# Patient Record
Sex: Female | Born: 1960 | Race: White | Hispanic: No | Smoking: Never smoker
Health system: Southern US, Community
[De-identification: ages and names within clinical notes are randomized; demographics above are authoritative.]

## PROBLEM LIST (undated history)

## (undated) DIAGNOSIS — D229 Melanocytic nevi, unspecified: Secondary | ICD-10-CM

## (undated) DIAGNOSIS — F0280 Dementia in other diseases classified elsewhere without behavioral disturbance: Secondary | ICD-10-CM

## (undated) DIAGNOSIS — F0390 Unspecified dementia without behavioral disturbance: Secondary | ICD-10-CM

## (undated) DIAGNOSIS — A599 Trichomoniasis, unspecified: Secondary | ICD-10-CM

## (undated) DIAGNOSIS — R51 Headache: Secondary | ICD-10-CM

## (undated) DIAGNOSIS — D332 Benign neoplasm of brain, unspecified: Secondary | ICD-10-CM

## (undated) DIAGNOSIS — F32A Depression, unspecified: Secondary | ICD-10-CM

## (undated) DIAGNOSIS — F419 Anxiety disorder, unspecified: Secondary | ICD-10-CM

## (undated) DIAGNOSIS — M810 Age-related osteoporosis without current pathological fracture: Secondary | ICD-10-CM

## (undated) DIAGNOSIS — R569 Unspecified convulsions: Secondary | ICD-10-CM

## (undated) DIAGNOSIS — I10 Essential (primary) hypertension: Secondary | ICD-10-CM

## (undated) DIAGNOSIS — G8929 Other chronic pain: Secondary | ICD-10-CM

## (undated) DIAGNOSIS — E7800 Pure hypercholesterolemia, unspecified: Secondary | ICD-10-CM

## (undated) HISTORY — DX: Dementia in other diseases classified elsewhere, unspecified severity, without behavioral disturbance, psychotic disturbance, mood disturbance, and anxiety: F02.80

## (undated) HISTORY — DX: Anxiety disorder, unspecified: F41.9

## (undated) HISTORY — DX: Other chronic pain: G89.29

## (undated) HISTORY — DX: Melanocytic nevi, unspecified: D22.9

## (undated) HISTORY — DX: Depression, unspecified: F32.A

## (undated) HISTORY — DX: Unspecified dementia, unspecified severity, without behavioral disturbance, psychotic disturbance, mood disturbance, and anxiety: F03.90

## (undated) HISTORY — PX: TUBAL LIGATION: SHX77

## (undated) HISTORY — DX: Pure hypercholesterolemia, unspecified: E78.00

## (undated) HISTORY — DX: Trichomoniasis, unspecified: A59.9

## (undated) HISTORY — DX: Headache: R51

## (undated) HISTORY — DX: Age-related osteoporosis without current pathological fracture: M81.0

---

## 2000-06-19 ENCOUNTER — Emergency Department (HOSPITAL_COMMUNITY): Admission: EM | Admit: 2000-06-19 | Discharge: 2000-06-19 | Payer: Self-pay | Admitting: Emergency Medicine

## 2000-07-08 ENCOUNTER — Emergency Department (HOSPITAL_COMMUNITY): Admission: EM | Admit: 2000-07-08 | Discharge: 2000-07-08 | Payer: Self-pay | Admitting: Emergency Medicine

## 2000-07-17 ENCOUNTER — Emergency Department (HOSPITAL_COMMUNITY): Admission: EM | Admit: 2000-07-17 | Discharge: 2000-07-17 | Payer: Self-pay | Admitting: Emergency Medicine

## 2000-11-14 ENCOUNTER — Emergency Department (HOSPITAL_COMMUNITY): Admission: EM | Admit: 2000-11-14 | Discharge: 2000-11-14 | Payer: Self-pay | Admitting: *Deleted

## 2001-01-06 ENCOUNTER — Emergency Department (HOSPITAL_COMMUNITY): Admission: EM | Admit: 2001-01-06 | Discharge: 2001-01-06 | Payer: Self-pay | Admitting: Emergency Medicine

## 2001-04-26 ENCOUNTER — Emergency Department (HOSPITAL_COMMUNITY): Admission: EM | Admit: 2001-04-26 | Discharge: 2001-04-26 | Payer: Self-pay | Admitting: *Deleted

## 2001-05-19 ENCOUNTER — Other Ambulatory Visit: Admission: RE | Admit: 2001-05-19 | Discharge: 2001-05-19 | Payer: Self-pay | Admitting: Internal Medicine

## 2001-11-21 ENCOUNTER — Emergency Department (HOSPITAL_COMMUNITY): Admission: EM | Admit: 2001-11-21 | Discharge: 2001-11-21 | Payer: Self-pay | Admitting: *Deleted

## 2002-05-03 ENCOUNTER — Emergency Department (HOSPITAL_COMMUNITY): Admission: EM | Admit: 2002-05-03 | Discharge: 2002-05-03 | Payer: Self-pay | Admitting: Emergency Medicine

## 2002-06-06 ENCOUNTER — Encounter: Payer: Self-pay | Admitting: *Deleted

## 2002-06-06 ENCOUNTER — Emergency Department (HOSPITAL_COMMUNITY): Admission: EM | Admit: 2002-06-06 | Discharge: 2002-06-06 | Payer: Self-pay | Admitting: *Deleted

## 2002-07-09 ENCOUNTER — Emergency Department (HOSPITAL_COMMUNITY): Admission: EM | Admit: 2002-07-09 | Discharge: 2002-07-09 | Payer: Self-pay | Admitting: Emergency Medicine

## 2002-08-28 ENCOUNTER — Encounter: Payer: Self-pay | Admitting: Emergency Medicine

## 2002-08-28 ENCOUNTER — Emergency Department (HOSPITAL_COMMUNITY): Admission: EM | Admit: 2002-08-28 | Discharge: 2002-08-28 | Payer: Self-pay | Admitting: Emergency Medicine

## 2003-03-30 ENCOUNTER — Emergency Department (HOSPITAL_COMMUNITY): Admission: EM | Admit: 2003-03-30 | Discharge: 2003-03-30 | Payer: Self-pay | Admitting: Emergency Medicine

## 2003-05-22 ENCOUNTER — Emergency Department (HOSPITAL_COMMUNITY): Admission: EM | Admit: 2003-05-22 | Discharge: 2003-05-22 | Payer: Self-pay | Admitting: Emergency Medicine

## 2003-12-11 ENCOUNTER — Ambulatory Visit (HOSPITAL_COMMUNITY): Admission: RE | Admit: 2003-12-11 | Discharge: 2003-12-11 | Payer: Self-pay | Admitting: Internal Medicine

## 2004-03-01 ENCOUNTER — Emergency Department (HOSPITAL_COMMUNITY): Admission: EM | Admit: 2004-03-01 | Discharge: 2004-03-01 | Payer: Self-pay | Admitting: Emergency Medicine

## 2004-03-08 ENCOUNTER — Emergency Department (HOSPITAL_COMMUNITY): Admission: EM | Admit: 2004-03-08 | Discharge: 2004-03-08 | Payer: Self-pay | Admitting: Emergency Medicine

## 2004-04-25 ENCOUNTER — Emergency Department (HOSPITAL_COMMUNITY): Admission: EM | Admit: 2004-04-25 | Discharge: 2004-04-25 | Payer: Self-pay | Admitting: Emergency Medicine

## 2005-01-18 ENCOUNTER — Emergency Department (HOSPITAL_COMMUNITY): Admission: EM | Admit: 2005-01-18 | Discharge: 2005-01-18 | Payer: Self-pay | Admitting: Emergency Medicine

## 2005-07-03 ENCOUNTER — Ambulatory Visit (HOSPITAL_COMMUNITY): Admission: RE | Admit: 2005-07-03 | Discharge: 2005-07-03 | Payer: Self-pay | Admitting: Urology

## 2006-02-16 ENCOUNTER — Emergency Department (HOSPITAL_COMMUNITY): Admission: EM | Admit: 2006-02-16 | Discharge: 2006-02-16 | Payer: Self-pay | Admitting: Emergency Medicine

## 2007-02-27 ENCOUNTER — Emergency Department (HOSPITAL_COMMUNITY): Admission: EM | Admit: 2007-02-27 | Discharge: 2007-02-27 | Payer: Self-pay | Admitting: Emergency Medicine

## 2008-12-27 ENCOUNTER — Emergency Department (HOSPITAL_COMMUNITY): Admission: EM | Admit: 2008-12-27 | Discharge: 2008-12-28 | Payer: Self-pay | Admitting: Emergency Medicine

## 2010-03-12 ENCOUNTER — Ambulatory Visit (HOSPITAL_COMMUNITY)
Admission: RE | Admit: 2010-03-12 | Discharge: 2010-03-12 | Payer: Self-pay | Source: Home / Self Care | Attending: Internal Medicine | Admitting: Internal Medicine

## 2010-03-30 ENCOUNTER — Encounter: Payer: Self-pay | Admitting: Internal Medicine

## 2010-03-31 ENCOUNTER — Encounter: Payer: Self-pay | Admitting: Family Medicine

## 2010-04-01 ENCOUNTER — Encounter: Payer: Self-pay | Admitting: Internal Medicine

## 2010-04-10 ENCOUNTER — Encounter: Payer: Self-pay | Admitting: Internal Medicine

## 2010-06-13 LAB — WET PREP, GENITAL
Clue Cells Wet Prep HPF POC: NONE SEEN
Trich, Wet Prep: NONE SEEN
WBC, Wet Prep HPF POC: NONE SEEN
Yeast Wet Prep HPF POC: NONE SEEN

## 2010-06-13 LAB — BASIC METABOLIC PANEL
BUN: 7 mg/dL (ref 6–23)
CO2: 28 mEq/L (ref 19–32)
Calcium: 9.8 mg/dL (ref 8.4–10.5)
Chloride: 108 mEq/L (ref 96–112)
Creatinine, Ser: 0.66 mg/dL (ref 0.4–1.2)
GFR calc Af Amer: >60 mL/min (ref >60)
GFR calc non Af Amer: >60 mL/min (ref >60)
Glucose, Bld: 103 mg/dL — ABNORMAL HIGH (ref 70–99)
Potassium: 3.1 mEq/L — ABNORMAL LOW (ref 3.5–5.1)
Sodium: 138 mEq/L (ref 135–145)

## 2010-06-13 LAB — DIFFERENTIAL
Basophils Absolute: 0 10*3/uL (ref 0.0–0.1)
Basophils Relative: 0 % (ref 0–1)
Eosinophils Absolute: 0 10*3/uL (ref 0.0–0.7)
Eosinophils Relative: 0 % (ref 0–5)
Lymphocytes Relative: 21 % (ref 12–46)
Lymphs Abs: 1.6 10*3/uL (ref 0.7–4.0)
Monocytes Absolute: 0.5 10*3/uL (ref 0.1–1.0)
Monocytes Relative: 6 % (ref 3–12)
Neutro Abs: 5.5 10*3/uL (ref 1.7–7.7)
Neutrophils Relative %: 72 % (ref 43–77)

## 2010-06-13 LAB — CBC
HCT: 35.9 % — ABNORMAL LOW (ref 36.0–46.0)
Hemoglobin: 12.3 g/dL (ref 12.0–15.0)
MCHC: 34.4 g/dL (ref 30.0–36.0)
MCV: 93.9 fL (ref 78.0–100.0)
Platelets: 205 10*3/uL (ref 150–400)
RBC: 3.83 MIL/uL — ABNORMAL LOW (ref 3.87–5.11)
RDW: 12.9 % (ref 11.5–15.5)
WBC: 7.7 10*3/uL (ref 4.0–10.5)

## 2010-06-13 LAB — URINALYSIS, ROUTINE W REFLEX MICROSCOPIC
Bilirubin Urine: NEGATIVE
Glucose, UA: NEGATIVE mg/dL
Leukocytes, UA: NEGATIVE
Nitrite: POSITIVE — AB
Protein, ur: 100 mg/dL — AB
Specific Gravity, Urine: 1.02 (ref 1.005–1.030)
Urobilinogen, UA: 1 mg/dL (ref 0.0–1.0)
pH: 6.5 (ref 5.0–8.0)

## 2010-06-13 LAB — PREGNANCY, URINE: Preg Test, Ur: NEGATIVE

## 2010-06-13 LAB — URINE MICROSCOPIC-ADD ON

## 2010-06-13 LAB — GC/CHLAMYDIA PROBE AMP, GENITAL
Chlamydia, DNA Probe: NEGATIVE
GC Probe Amp, Genital: NEGATIVE

## 2010-09-16 ENCOUNTER — Other Ambulatory Visit (HOSPITAL_COMMUNITY): Payer: Self-pay | Admitting: Internal Medicine

## 2010-09-16 DIAGNOSIS — R51 Headache: Secondary | ICD-10-CM

## 2010-09-18 ENCOUNTER — Ambulatory Visit (HOSPITAL_COMMUNITY)
Admission: RE | Admit: 2010-09-18 | Discharge: 2010-09-18 | Disposition: A | Discharge status: Home / Self Care | Payer: Medicare Other | Source: Ambulatory Visit | Attending: Internal Medicine | Admitting: Internal Medicine

## 2010-09-18 DIAGNOSIS — G939 Disorder of brain, unspecified: Secondary | ICD-10-CM | POA: Insufficient documentation

## 2010-09-18 DIAGNOSIS — R51 Headache: Secondary | ICD-10-CM | POA: Insufficient documentation

## 2010-11-19 ENCOUNTER — Ambulatory Visit (HOSPITAL_COMMUNITY)
Admission: RE | Admit: 2010-11-19 | Discharge: 2010-11-19 | Disposition: A | Discharge status: Home / Self Care | Payer: Medicare Other | Source: Ambulatory Visit | Attending: Family Medicine | Admitting: Family Medicine

## 2010-11-19 ENCOUNTER — Other Ambulatory Visit (HOSPITAL_COMMUNITY): Payer: Self-pay | Admitting: Family Medicine

## 2010-11-19 ENCOUNTER — Encounter (HOSPITAL_COMMUNITY): Payer: Self-pay

## 2010-11-19 DIAGNOSIS — R109 Unspecified abdominal pain: Secondary | ICD-10-CM

## 2010-11-19 DIAGNOSIS — J069 Acute upper respiratory infection, unspecified: Secondary | ICD-10-CM

## 2010-11-19 DIAGNOSIS — K5909 Other constipation: Secondary | ICD-10-CM | POA: Insufficient documentation

## 2010-11-19 HISTORY — DX: Essential (primary) hypertension: I10

## 2010-12-13 LAB — URINALYSIS, ROUTINE W REFLEX MICROSCOPIC
Bilirubin Urine: NEGATIVE
Glucose, UA: NEGATIVE
Ketones, ur: NEGATIVE
Nitrite: NEGATIVE
pH: >9 — ABNORMAL HIGH

## 2010-12-13 LAB — BASIC METABOLIC PANEL
BUN: 11
CO2: 25
Calcium: 9.7
Chloride: 103
Creatinine, Ser: 0.67
GFR calc Af Amer: >60
GFR calc non Af Amer: >60
Glucose, Bld: 126 — ABNORMAL HIGH
Potassium: 3.4 — ABNORMAL LOW
Sodium: 136

## 2010-12-13 LAB — DIFFERENTIAL
Basophils Absolute: 0
Eosinophils Absolute: 0 — ABNORMAL LOW
Eosinophils Relative: 0
Lymphocytes Relative: 5 — ABNORMAL LOW
Monocytes Absolute: 0.4

## 2010-12-13 LAB — CBC
HCT: 29.5 — ABNORMAL LOW
Hemoglobin: 9.5 — ABNORMAL LOW
MCV: 73.7 — ABNORMAL LOW
RDW: 16.2 — ABNORMAL HIGH

## 2010-12-13 LAB — URINE MICROSCOPIC-ADD ON

## 2011-03-19 DIAGNOSIS — E785 Hyperlipidemia, unspecified: Secondary | ICD-10-CM | POA: Diagnosis not present

## 2011-03-19 DIAGNOSIS — M6740 Ganglion, unspecified site: Secondary | ICD-10-CM | POA: Diagnosis not present

## 2011-03-19 DIAGNOSIS — D510 Vitamin B12 deficiency anemia due to intrinsic factor deficiency: Secondary | ICD-10-CM | POA: Diagnosis not present

## 2011-03-19 DIAGNOSIS — Z6825 Body mass index (BMI) 25.0-25.9, adult: Secondary | ICD-10-CM | POA: Diagnosis not present

## 2011-03-19 DIAGNOSIS — I10 Essential (primary) hypertension: Secondary | ICD-10-CM | POA: Diagnosis not present

## 2011-05-05 ENCOUNTER — Other Ambulatory Visit (HOSPITAL_COMMUNITY): Payer: Self-pay | Admitting: Internal Medicine

## 2011-05-05 DIAGNOSIS — K5900 Constipation, unspecified: Secondary | ICD-10-CM | POA: Diagnosis not present

## 2011-05-05 DIAGNOSIS — Z139 Encounter for screening, unspecified: Secondary | ICD-10-CM

## 2011-05-05 DIAGNOSIS — D518 Other vitamin B12 deficiency anemias: Secondary | ICD-10-CM | POA: Diagnosis not present

## 2011-05-05 DIAGNOSIS — E785 Hyperlipidemia, unspecified: Secondary | ICD-10-CM | POA: Diagnosis not present

## 2011-05-05 DIAGNOSIS — IMO0002 Reserved for concepts with insufficient information to code with codable children: Secondary | ICD-10-CM | POA: Diagnosis not present

## 2011-05-05 DIAGNOSIS — I10 Essential (primary) hypertension: Secondary | ICD-10-CM | POA: Diagnosis not present

## 2011-05-05 DIAGNOSIS — G43909 Migraine, unspecified, not intractable, without status migrainosus: Secondary | ICD-10-CM | POA: Diagnosis not present

## 2011-05-05 DIAGNOSIS — N390 Urinary tract infection, site not specified: Secondary | ICD-10-CM | POA: Diagnosis not present

## 2011-05-08 ENCOUNTER — Ambulatory Visit (HOSPITAL_COMMUNITY): Admission: RE | Admit: 2011-05-08 | Payer: Medicare Other | Source: Ambulatory Visit

## 2011-05-12 DIAGNOSIS — R51 Headache: Secondary | ICD-10-CM | POA: Diagnosis not present

## 2011-05-12 DIAGNOSIS — R42 Dizziness and giddiness: Secondary | ICD-10-CM | POA: Diagnosis not present

## 2011-05-19 ENCOUNTER — Ambulatory Visit (HOSPITAL_COMMUNITY): Payer: Medicare Other

## 2011-05-27 ENCOUNTER — Ambulatory Visit (HOSPITAL_COMMUNITY)
Admission: RE | Admit: 2011-05-27 | Discharge: 2011-05-27 | Disposition: A | Discharge status: Home / Self Care | Payer: Medicare Other | Source: Ambulatory Visit | Attending: Internal Medicine | Admitting: Internal Medicine

## 2011-05-27 DIAGNOSIS — Z139 Encounter for screening, unspecified: Secondary | ICD-10-CM

## 2011-05-27 DIAGNOSIS — Z1231 Encounter for screening mammogram for malignant neoplasm of breast: Secondary | ICD-10-CM | POA: Insufficient documentation

## 2011-05-29 ENCOUNTER — Ambulatory Visit (HOSPITAL_COMMUNITY)
Admission: RE | Admit: 2011-05-29 | Discharge: 2011-05-29 | Disposition: A | Discharge status: Home / Self Care | Payer: Medicare Other | Source: Ambulatory Visit | Attending: Internal Medicine | Admitting: Internal Medicine

## 2011-05-29 ENCOUNTER — Other Ambulatory Visit (HOSPITAL_COMMUNITY): Payer: Self-pay | Admitting: Internal Medicine

## 2011-05-29 DIAGNOSIS — G43909 Migraine, unspecified, not intractable, without status migrainosus: Secondary | ICD-10-CM | POA: Diagnosis not present

## 2011-05-29 DIAGNOSIS — Z7901 Long term (current) use of anticoagulants: Secondary | ICD-10-CM | POA: Diagnosis not present

## 2011-05-29 DIAGNOSIS — R079 Chest pain, unspecified: Secondary | ICD-10-CM

## 2011-05-29 DIAGNOSIS — Z79899 Other long term (current) drug therapy: Secondary | ICD-10-CM | POA: Diagnosis not present

## 2011-05-29 DIAGNOSIS — S2239XA Fracture of one rib, unspecified side, initial encounter for closed fracture: Secondary | ICD-10-CM | POA: Diagnosis not present

## 2011-05-29 DIAGNOSIS — D510 Vitamin B12 deficiency anemia due to intrinsic factor deficiency: Secondary | ICD-10-CM | POA: Diagnosis not present

## 2011-05-29 DIAGNOSIS — IMO0002 Reserved for concepts with insufficient information to code with codable children: Secondary | ICD-10-CM | POA: Diagnosis not present

## 2011-05-29 DIAGNOSIS — R0789 Other chest pain: Secondary | ICD-10-CM | POA: Diagnosis not present

## 2011-05-29 DIAGNOSIS — G8929 Other chronic pain: Secondary | ICD-10-CM | POA: Diagnosis not present

## 2011-06-13 ENCOUNTER — Other Ambulatory Visit (HOSPITAL_COMMUNITY): Payer: Self-pay | Admitting: Internal Medicine

## 2011-06-13 DIAGNOSIS — Z0000 Encounter for general adult medical examination without abnormal findings: Secondary | ICD-10-CM | POA: Diagnosis not present

## 2011-06-13 DIAGNOSIS — E785 Hyperlipidemia, unspecified: Secondary | ICD-10-CM | POA: Diagnosis not present

## 2011-06-13 DIAGNOSIS — D510 Vitamin B12 deficiency anemia due to intrinsic factor deficiency: Secondary | ICD-10-CM | POA: Diagnosis not present

## 2011-06-13 DIAGNOSIS — I10 Essential (primary) hypertension: Secondary | ICD-10-CM | POA: Diagnosis not present

## 2011-06-13 DIAGNOSIS — G43909 Migraine, unspecified, not intractable, without status migrainosus: Secondary | ICD-10-CM | POA: Diagnosis not present

## 2011-06-13 DIAGNOSIS — R0609 Other forms of dyspnea: Secondary | ICD-10-CM | POA: Diagnosis not present

## 2011-06-27 ENCOUNTER — Other Ambulatory Visit (HOSPITAL_COMMUNITY): Payer: Self-pay | Admitting: Internal Medicine

## 2011-06-27 DIAGNOSIS — G43909 Migraine, unspecified, not intractable, without status migrainosus: Secondary | ICD-10-CM

## 2011-07-01 ENCOUNTER — Ambulatory Visit (HOSPITAL_COMMUNITY): Payer: Medicare Other

## 2011-07-02 ENCOUNTER — Ambulatory Visit (HOSPITAL_COMMUNITY): Payer: Medicare Other

## 2011-07-04 ENCOUNTER — Ambulatory Visit (HOSPITAL_COMMUNITY)
Admission: RE | Admit: 2011-07-04 | Discharge: 2011-07-04 | Disposition: A | Discharge status: Home / Self Care | Payer: Medicare Other | Source: Ambulatory Visit | Attending: Internal Medicine | Admitting: Internal Medicine

## 2011-07-04 DIAGNOSIS — G43909 Migraine, unspecified, not intractable, without status migrainosus: Secondary | ICD-10-CM

## 2011-07-04 DIAGNOSIS — R51 Headache: Secondary | ICD-10-CM | POA: Diagnosis not present

## 2011-07-04 DIAGNOSIS — D320 Benign neoplasm of cerebral meninges: Secondary | ICD-10-CM | POA: Insufficient documentation

## 2011-07-17 DIAGNOSIS — D235 Other benign neoplasm of skin of trunk: Secondary | ICD-10-CM | POA: Diagnosis not present

## 2011-07-17 DIAGNOSIS — B079 Viral wart, unspecified: Secondary | ICD-10-CM | POA: Diagnosis not present

## 2011-07-17 DIAGNOSIS — L821 Other seborrheic keratosis: Secondary | ICD-10-CM | POA: Diagnosis not present

## 2011-07-24 DIAGNOSIS — B079 Viral wart, unspecified: Secondary | ICD-10-CM | POA: Diagnosis not present

## 2011-08-14 DIAGNOSIS — G43919 Migraine, unspecified, intractable, without status migrainosus: Secondary | ICD-10-CM | POA: Diagnosis not present

## 2011-08-14 DIAGNOSIS — R51 Headache: Secondary | ICD-10-CM | POA: Diagnosis not present

## 2011-09-05 DIAGNOSIS — N390 Urinary tract infection, site not specified: Secondary | ICD-10-CM | POA: Diagnosis not present

## 2011-09-05 DIAGNOSIS — D518 Other vitamin B12 deficiency anemias: Secondary | ICD-10-CM | POA: Diagnosis not present

## 2011-09-05 DIAGNOSIS — IMO0002 Reserved for concepts with insufficient information to code with codable children: Secondary | ICD-10-CM | POA: Diagnosis not present

## 2011-10-15 DIAGNOSIS — IMO0002 Reserved for concepts with insufficient information to code with codable children: Secondary | ICD-10-CM | POA: Diagnosis not present

## 2011-10-15 DIAGNOSIS — M25539 Pain in unspecified wrist: Secondary | ICD-10-CM | POA: Diagnosis not present

## 2011-10-15 DIAGNOSIS — D510 Vitamin B12 deficiency anemia due to intrinsic factor deficiency: Secondary | ICD-10-CM | POA: Diagnosis not present

## 2011-10-15 DIAGNOSIS — G8929 Other chronic pain: Secondary | ICD-10-CM | POA: Diagnosis not present

## 2011-10-15 DIAGNOSIS — I10 Essential (primary) hypertension: Secondary | ICD-10-CM | POA: Diagnosis not present

## 2011-10-15 DIAGNOSIS — F411 Generalized anxiety disorder: Secondary | ICD-10-CM | POA: Diagnosis not present

## 2011-11-14 DIAGNOSIS — M546 Pain in thoracic spine: Secondary | ICD-10-CM | POA: Diagnosis not present

## 2011-11-14 DIAGNOSIS — I10 Essential (primary) hypertension: Secondary | ICD-10-CM | POA: Diagnosis not present

## 2011-11-14 DIAGNOSIS — E785 Hyperlipidemia, unspecified: Secondary | ICD-10-CM | POA: Diagnosis not present

## 2011-11-14 DIAGNOSIS — IMO0002 Reserved for concepts with insufficient information to code with codable children: Secondary | ICD-10-CM | POA: Diagnosis not present

## 2011-11-14 DIAGNOSIS — D510 Vitamin B12 deficiency anemia due to intrinsic factor deficiency: Secondary | ICD-10-CM | POA: Diagnosis not present

## 2011-11-20 DIAGNOSIS — IMO0002 Reserved for concepts with insufficient information to code with codable children: Secondary | ICD-10-CM | POA: Diagnosis not present

## 2011-11-20 DIAGNOSIS — J069 Acute upper respiratory infection, unspecified: Secondary | ICD-10-CM | POA: Diagnosis not present

## 2011-11-20 DIAGNOSIS — N390 Urinary tract infection, site not specified: Secondary | ICD-10-CM | POA: Diagnosis not present

## 2011-12-23 DIAGNOSIS — I10 Essential (primary) hypertension: Secondary | ICD-10-CM | POA: Diagnosis not present

## 2011-12-23 DIAGNOSIS — G43909 Migraine, unspecified, not intractable, without status migrainosus: Secondary | ICD-10-CM | POA: Diagnosis not present

## 2011-12-23 DIAGNOSIS — Z23 Encounter for immunization: Secondary | ICD-10-CM | POA: Diagnosis not present

## 2011-12-23 DIAGNOSIS — IMO0002 Reserved for concepts with insufficient information to code with codable children: Secondary | ICD-10-CM | POA: Diagnosis not present

## 2011-12-23 DIAGNOSIS — G8929 Other chronic pain: Secondary | ICD-10-CM | POA: Diagnosis not present

## 2011-12-23 DIAGNOSIS — D510 Vitamin B12 deficiency anemia due to intrinsic factor deficiency: Secondary | ICD-10-CM | POA: Diagnosis not present

## 2011-12-23 DIAGNOSIS — E785 Hyperlipidemia, unspecified: Secondary | ICD-10-CM | POA: Diagnosis not present

## 2012-02-26 DIAGNOSIS — R5381 Other malaise: Secondary | ICD-10-CM | POA: Diagnosis not present

## 2012-02-26 DIAGNOSIS — E785 Hyperlipidemia, unspecified: Secondary | ICD-10-CM | POA: Diagnosis not present

## 2012-02-26 DIAGNOSIS — G8929 Other chronic pain: Secondary | ICD-10-CM | POA: Diagnosis not present

## 2012-03-26 DIAGNOSIS — D518 Other vitamin B12 deficiency anemias: Secondary | ICD-10-CM | POA: Diagnosis not present

## 2012-04-19 DIAGNOSIS — F411 Generalized anxiety disorder: Secondary | ICD-10-CM | POA: Diagnosis not present

## 2012-04-19 DIAGNOSIS — G8929 Other chronic pain: Secondary | ICD-10-CM | POA: Diagnosis not present

## 2012-04-19 DIAGNOSIS — G43909 Migraine, unspecified, not intractable, without status migrainosus: Secondary | ICD-10-CM | POA: Diagnosis not present

## 2012-04-19 DIAGNOSIS — Z6828 Body mass index (BMI) 28.0-28.9, adult: Secondary | ICD-10-CM | POA: Diagnosis not present

## 2012-05-05 DIAGNOSIS — R9389 Abnormal findings on diagnostic imaging of other specified body structures: Secondary | ICD-10-CM | POA: Diagnosis not present

## 2012-05-05 DIAGNOSIS — N926 Irregular menstruation, unspecified: Secondary | ICD-10-CM | POA: Diagnosis not present

## 2012-05-05 DIAGNOSIS — Z130 Encounter for screening for diseases of the blood and blood-forming organs and certain disorders involving the immune mechanism: Secondary | ICD-10-CM | POA: Diagnosis not present

## 2012-05-05 DIAGNOSIS — N83209 Unspecified ovarian cyst, unspecified side: Secondary | ICD-10-CM | POA: Diagnosis not present

## 2012-05-05 DIAGNOSIS — N949 Unspecified condition associated with female genital organs and menstrual cycle: Secondary | ICD-10-CM | POA: Diagnosis not present

## 2012-05-18 DIAGNOSIS — Z79899 Other long term (current) drug therapy: Secondary | ICD-10-CM | POA: Diagnosis not present

## 2012-05-18 DIAGNOSIS — I10 Essential (primary) hypertension: Secondary | ICD-10-CM | POA: Diagnosis not present

## 2012-05-18 DIAGNOSIS — K5900 Constipation, unspecified: Secondary | ICD-10-CM | POA: Diagnosis not present

## 2012-05-18 DIAGNOSIS — N390 Urinary tract infection, site not specified: Secondary | ICD-10-CM | POA: Diagnosis not present

## 2012-05-18 DIAGNOSIS — E785 Hyperlipidemia, unspecified: Secondary | ICD-10-CM | POA: Diagnosis not present

## 2012-05-18 DIAGNOSIS — D510 Vitamin B12 deficiency anemia due to intrinsic factor deficiency: Secondary | ICD-10-CM | POA: Diagnosis not present

## 2012-05-18 DIAGNOSIS — Z6827 Body mass index (BMI) 27.0-27.9, adult: Secondary | ICD-10-CM | POA: Diagnosis not present

## 2012-05-24 DIAGNOSIS — N840 Polyp of corpus uteri: Secondary | ICD-10-CM | POA: Diagnosis not present

## 2012-05-25 DIAGNOSIS — N840 Polyp of corpus uteri: Secondary | ICD-10-CM | POA: Diagnosis not present

## 2012-05-28 ENCOUNTER — Telehealth: Payer: Self-pay | Admitting: Obstetrics and Gynecology

## 2012-05-28 NOTE — Telephone Encounter (Signed)
LM for pt to return call to office

## 2012-05-28 NOTE — Telephone Encounter (Signed)
Pt calling to get endometrial biopsy results and to notify Dr. Emelda Fear she had notice vaginal bleeding. Pt informed of WNL results from endometrial biopsy and for Pt to keep her appt next Wednesday.

## 2012-06-02 ENCOUNTER — Encounter: Payer: Self-pay | Admitting: Obstetrics and Gynecology

## 2012-06-02 ENCOUNTER — Ambulatory Visit (INDEPENDENT_AMBULATORY_CARE_PROVIDER_SITE_OTHER): Payer: Medicare Other | Admitting: Obstetrics and Gynecology

## 2012-06-02 VITALS — BP 148/86 | Ht 65.0 in | Wt 142.0 lb

## 2012-06-02 DIAGNOSIS — N816 Rectocele: Secondary | ICD-10-CM | POA: Diagnosis not present

## 2012-06-02 DIAGNOSIS — N924 Excessive bleeding in the premenopausal period: Secondary | ICD-10-CM

## 2012-06-02 DIAGNOSIS — N840 Polyp of corpus uteri: Secondary | ICD-10-CM

## 2012-06-02 DIAGNOSIS — N8111 Cystocele, midline: Secondary | ICD-10-CM | POA: Diagnosis not present

## 2012-06-02 MED ORDER — MEDROXYPROGESTERONE ACETATE 10 MG PO TABS: 10.0000 mg | ORAL_TABLET | Freq: Every day | ORAL | Status: DC

## 2012-06-02 NOTE — Progress Notes (Signed)
Followup endometrial biopsy: Julia [name] had endometrial biopsy last week. Returns showing secretory endometrium and benign endometrial polyp. Bleeding has been light monthly. Pathology report shows extremity endometrium and suspected endometrial polyp.  Review of systems shows difficulty with defecation, sometimes requiring blood in stool softener or in the. The patient does not splint in order to assist with defecation. Denies stress incontinence or urge incontinence impression 1 perimenopause 2                 second-degree uterine descensus 3                rectocele. Discussed treatment options. Will begin with Provera 10 mg daily x2 weeks each month for 3 months to followup at bedtime for exam and consideration of surgical options

## 2012-06-03 ENCOUNTER — Telehealth: Payer: Self-pay

## 2012-06-03 NOTE — Telephone Encounter (Signed)
Pt was referred from Dr. Sherwood Gambler for screening colonoscopy. She said she is being seen by Dr. Emelda Fear for some problems and might have to have a hysterectomy. She wants to wait a few weeks and call back to schedule. Said she is not having any GI problems at this time. Sending letter to PCP and putting on list to check on in a few weeks.

## 2012-06-04 ENCOUNTER — Other Ambulatory Visit (HOSPITAL_COMMUNITY): Payer: Self-pay | Admitting: Internal Medicine

## 2012-06-04 DIAGNOSIS — Z139 Encounter for screening, unspecified: Secondary | ICD-10-CM

## 2012-06-07 ENCOUNTER — Ambulatory Visit (HOSPITAL_COMMUNITY): Payer: Medicare Other

## 2012-06-17 ENCOUNTER — Telehealth: Payer: Self-pay | Admitting: Obstetrics and Gynecology

## 2012-06-17 DIAGNOSIS — R52 Pain, unspecified: Secondary | ICD-10-CM

## 2012-06-17 NOTE — Telephone Encounter (Signed)
Left message. JSY 

## 2012-06-18 NOTE — Telephone Encounter (Signed)
Spoke with pt. Having stomach pain, pain in legs, backpain. Pt requests something for pain. Heavy bleeding x 1 day. Uses [employer] .

## 2012-06-21 ENCOUNTER — Other Ambulatory Visit: Payer: Self-pay | Admitting: *Deleted

## 2012-06-21 MED ORDER — HYDROCODONE-ACETAMINOPHEN 5-325 MG PO TABS: 1.0000 | ORAL_TABLET | Freq: Four times a day (QID) | ORAL | Status: DC | PRN

## 2012-06-21 NOTE — Telephone Encounter (Signed)
Patient may take hydrocodone 5/325 one or two every 6 hrs.  NUmber 30 refril x 1, and may take ibuprofen as needed.

## 2012-06-21 NOTE — Telephone Encounter (Signed)
RX for Hydrocodone left at front desk for pt to pick up, pt also told can take Ibuprofen, Pt transferred to front staff for an appt to be made with Dr. Emelda Fear for  follow up on pain and vaginal bleeding.

## 2012-06-21 NOTE — Telephone Encounter (Signed)
Rx for Hydrocodone 5/325 mg left at front desk for pt to pick up.

## 2012-06-22 DIAGNOSIS — E785 Hyperlipidemia, unspecified: Secondary | ICD-10-CM | POA: Diagnosis not present

## 2012-06-22 DIAGNOSIS — K5901 Slow transit constipation: Secondary | ICD-10-CM | POA: Diagnosis not present

## 2012-06-22 DIAGNOSIS — G8929 Other chronic pain: Secondary | ICD-10-CM | POA: Diagnosis not present

## 2012-06-22 DIAGNOSIS — Z6827 Body mass index (BMI) 27.0-27.9, adult: Secondary | ICD-10-CM | POA: Diagnosis not present

## 2012-06-22 DIAGNOSIS — I10 Essential (primary) hypertension: Secondary | ICD-10-CM | POA: Diagnosis not present

## 2012-06-22 DIAGNOSIS — D510 Vitamin B12 deficiency anemia due to intrinsic factor deficiency: Secondary | ICD-10-CM | POA: Diagnosis not present

## 2012-06-23 ENCOUNTER — Ambulatory Visit (INDEPENDENT_AMBULATORY_CARE_PROVIDER_SITE_OTHER): Payer: Medicare Other | Admitting: Obstetrics and Gynecology

## 2012-06-23 ENCOUNTER — Encounter: Payer: Self-pay | Admitting: Obstetrics and Gynecology

## 2012-06-23 ENCOUNTER — Telehealth: Payer: Self-pay | Admitting: *Deleted

## 2012-06-23 VITALS — BP 150/96 | Wt 143.6 lb

## 2012-06-23 DIAGNOSIS — A5901 Trichomonal vulvovaginitis: Secondary | ICD-10-CM | POA: Diagnosis not present

## 2012-06-23 DIAGNOSIS — Z124 Encounter for screening for malignant neoplasm of cervix: Secondary | ICD-10-CM | POA: Diagnosis not present

## 2012-06-23 DIAGNOSIS — N949 Unspecified condition associated with female genital organs and menstrual cycle: Secondary | ICD-10-CM | POA: Diagnosis not present

## 2012-06-23 DIAGNOSIS — N920 Excessive and frequent menstruation with regular cycle: Secondary | ICD-10-CM

## 2012-06-23 DIAGNOSIS — D259 Leiomyoma of uterus, unspecified: Secondary | ICD-10-CM | POA: Diagnosis not present

## 2012-06-23 DIAGNOSIS — A599 Trichomoniasis, unspecified: Secondary | ICD-10-CM | POA: Diagnosis not present

## 2012-06-23 DIAGNOSIS — Z8619 Personal history of other infectious and parasitic diseases: Secondary | ICD-10-CM | POA: Diagnosis not present

## 2012-06-23 DIAGNOSIS — N938 Other specified abnormal uterine and vaginal bleeding: Secondary | ICD-10-CM

## 2012-06-23 MED ORDER — MEGESTROL ACETATE 40 MG PO TABS: 40.0000 mg | ORAL_TABLET | Freq: Two times a day (BID) | ORAL | Status: DC

## 2012-06-23 NOTE — Progress Notes (Signed)
  Subjective:   Late entry, redictation  The patient seen now in followup Regarding treatment options regarding irregular perimenopausal bleeding. She is 52 years of age, and was originally seen in February with irregular bleeding during February. Last Pap was 2012 normal mammogram 2013 normal menses described as heavy lasting 4-5 days with 2-3 days of heavy flow. Ultrasound performed late February showed a thickened endometrium which was biopsy and returned showing secretory endometrium. 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 Review of record shows that there is no recent Pap smear since 2012. That Pap smear was reported as normal. Upon received the records, the records indicate that her last Pap smear was normal in 2010 due to this late information we will need to schedule a preoperative appointment and obtain a Pap smear prior to any procedure

## 2012-06-23 NOTE — Patient Instructions (Addendum)
nEtonogestrel implant What is this medicine? ETONOGESTREL is a contraceptive (birth control) device. It is used to prevent pregnancy. It can be used for up to 3 years. This medicine may be used for other purposes; ask your health care provider or pharmacist if you have questions. What should I tell my health care provider before I take this medicine? They need to know if you have any of these conditions: -abnormal vaginal bleeding -blood vessel disease or blood clots -cancer of the breast, cervix, or liver -depression -diabetes -gallbladder disease -headaches -heart disease or recent heart attack -high blood pressure -high cholesterol -kidney disease -liver disease -renal disease -seizures -tobacco smoker -an unusual or allergic reaction to etonogestrel, other hormones, anesthetics or antiseptics, medicines, foods, dyes, or preservatives -pregnant or trying to get pregnant -breast-feeding How should I use this medicine? This device is inserted just under the skin on the inner side of your upper arm by a health care professional. Talk to your pediatrician regarding the use of this medicine in children. Special care may be needed. Overdosage: If you think you've taken too much of this medicine contact a poison control center or emergency room at once. Overdosage: If you think you have taken too much of this medicine contact a poison control center or emergency room at once. NOTE: This medicine is only for you. Do not share this medicine with others. What if I miss a dose? This does not apply. What may interact with this medicine? Do not take this medicine with any of the following medications: -amprenavir -bosentan -fosamprenavir This medicine may also interact with the following medications: -barbiturate medicines for inducing sleep or treating seizures -certain medicines for fungal infections like ketoconazole and itraconazole -griseofulvin -medicines to treat seizures like  carbamazepine, felbamate, oxcarbazepine, phenytoin, topiramate -modafinil -phenylbutazone -rifampin -some medicines to treat HIV infection like atazanavir, indinavir, lopinavir, nelfinavir, tipranavir, ritonavir -St. Rhetta Cleek's wort This list may not describe all possible interactions. Give your health care provider a list of all the medicines, herbs, non-prescription drugs, or dietary supplements you use. Also tell them if you smoke, drink alcohol, or use illegal drugs. Some items may interact with your medicine. What should I watch for while using this medicine? This product does not protect you against HIV infection (AIDS) or other sexually transmitted diseases. You should be able to feel the implant by pressing your fingertips over the skin where it was inserted. Tell your doctor if you cannot feel the implant. What side effects may I notice from receiving this medicine? Side effects that you should report to your doctor or health care professional as soon as possible: -allergic reactions like skin rash, itching or hives, swelling of the face, lips, or tongue -breast lumps -changes in vision -confusion, trouble speaking or understanding -dark urine -depressed mood -general ill feeling or flu-like symptoms -light-colored stools -loss of appetite, nausea -right upper belly pain -severe headaches -severe pain, swelling, or tenderness in the abdomen -shortness of breath, chest pain, swelling in a leg -signs of pregnancy -sudden numbness or weakness of the face, arm or leg -trouble walking, dizziness, loss of balance or coordination -unusual vaginal bleeding, discharge -unusually weak or tired -yellowing of the eyes or skin Side effects that usually do not require medical attention (Report these to your doctor or health care professional if they continue or are bothersome.): -acne -breast pain -changes in weight -cough -fever or chills -headache -irregular menstrual  bleeding -itching, burning, and vaginal discharge -pain or difficulty passing urine -sore throat This  list may not describe all possible side effects. Call your doctor for medical advice about side effects. You may report side effects to FDA at 1-800-FDA-1088. Where should I keep my medicine? This drug is given in a hospital or clinic and will not be stored at home. NOTE: This sheet is a summary. It may not cover all possible information. If you have questions about this medicine, talk to your doctor, pharmacist, or health care provider.  2013, Elsevier/Gold Standard. (11/17/2008 3:54:17 PM)  Endometrial Ablation Endometrial ablation removes the lining of the uterus (endometrium). It is usually a same day, outpatient treatment. Ablation helps avoid major surgery (such as a hysterectomy). A hysterectomy is removal of the cervix and uterus. Endometrial ablation has less risk and complications, has a shorter recovery period and is less expensive. After endometrial ablation, most women will have little or no menstrual bleeding. You may not keep your fertility. Pregnancy is no longer likely after this procedure but if you are pre-menopausal, you still need to use a reliable method of birth control following the procedure because pregnancy can occur. REASONS TO HAVE THE PROCEDURE MAY INCLUDE:  Heavy periods.  Bleeding that is causing anemia.  Anovulatory bleeding, very irregular, bleeding.  Bleeding submucous fibroids (on the lining inside the uterus) if they are smaller than 3 centimeters. REASONS NOT TO HAVE THE PROCEDURE MAY INCLUDE:  You wish to have more children.  You have a pre-cancerous or cancerous problem. The cause of any abnormal bleeding must be diagnosed before having the procedure.  You have pain coming from the uterus.  You have a submucus fibroid larger than 3 centimeters.  You recently had a baby.  You recently had an infection in the uterus.  You have a severe  retro-flexed, tipped uterus and cannot insert the instrument to do the ablation.  You had a Cesarean section or deep major surgery on the uterus.  The inner cavity of the uterus is too large for the endometrial ablation instrument. RISKS AND COMPLICATIONS   Perforation of the uterus.  Bleeding.  Infection of the uterus, bladder or vagina.  Injury to surrounding organs.  Cutting the cervix.  An air bubble to the lung (air embolus).  Pregnancy following the procedure.  Failure of the procedure to help the problem requiring hysterectomy.  Decreased ability to diagnose cancer in the lining of the uterus. BEFORE THE PROCEDURE  The lining of the uterus must be tested to make sure there is no pre-cancerous or cancer cells present.  Medications may be given to make the lining of the uterus thinner.  Ultrasound may be used to evaluate the size and look for abnormalities of the uterus.  Future pregnancy is not desired. PROCEDURE  There are different ways to destroy the lining of the uterus.   Resectoscope - radio frequency-alternating electric current is the most common one used.  Cryotherapy - freezing the lining of the uterus.  Heated Free Liquid - heated salt (saline) solution inserted into the uterus.  Microwave - uses high energy microwaves in the uterus.  Thermal Balloon - a catheter with a balloon tip is inserted into the uterus and filled with heated fluid. Your caregiver will talk with you about the method used in this clinic. They will also instruct you on the pros and cons of the procedure. Endometrial ablation is performed along with a procedure called operative hysteroscopy. A narrow viewing tube is inserted through the birth canal (vagina) and through the cervix into the uterus. A tiny  camera attached to the viewing tube (hysteroscope) allows the uterine cavity to be shown on a TV monitor during surgery. Your uterus is filled with a harmless liquid to make the  procedure easier. The lining of the uterus is then removed. The lining can also be removed with a resectoscope which allows your surgeon to cut away the lining of the uterus under direct vision. Usually, you will be able to go home within an hour after the procedure. HOME CARE INSTRUCTIONS   Do not drive for 24 hours.  No tampons, douching or intercourse for 2 weeks or until your caregiver approves.  Rest at home for 24 to 48 hours. You may then resume normal activities unless told differently by your caregiver.  Take your temperature two times a day for 4 days, and record it.  Take any medications your caregiver has ordered, as directed.  Use some form of contraception if you are pre-menopausal and do not want to get pregnant. Bleeding after the procedure is normal. It varies from light spotting and mildly watery to bloody discharge for 4 to 6 weeks. You may also have mild cramping. Only take over-the-counter or prescription medicines for pain, discomfort, or fever as directed by your caregiver. Do not use aspirin, as this may aggravate bleeding. Frequent urination during the first 24 hours is normal. You will not know how effective your surgery is until at least 3 months after the surgery. SEEK IMMEDIATE MEDICAL CARE IF:   Bleeding is heavier than a normal menstrual cycle.  An oral temperature above 102 F (38.9 C) develops.  You have increasing cramps or pains not relieved with medication or develop belly (abdominal) pain which does not seem to be related to the same area of earlier cramping and pain.  You are light headed, weak or have fainting episodes.  You develop pain in the shoulder strap areas.  You have chest or leg pain.  You have abnormal vaginal discharge.  You have painful urination. Document Released: 01/04/2004 Document Revised: 05/19/2011 Document Reviewed: 04/03/2007 [facility] Patient Information 2013 [place], [place]. Place abnormal uterine bleeding patient  instructions here.  Thank you for enrolling in MyChart. Please follow the instructions below to securely access your online medical record. MyChart allows you to send messages to your doctor, view your test results, manage appointments, and more.   How Do I Sign Up? 1. In your Internet browser, go to [employer] and enter [email or web address]. 2. Click on the Sign Up Now link in the Sign In box. You will see the New Member Sign Up page. 3. Enter your MyChart Access Code exactly as it appears below. You will not need to use this code after you've completed the sign-up process. If you do not sign up before the expiration date, you must request a new code. MyChart Access Code: [email or web address] Expires: 07/23/2012  3:09 PM  4. Enter your Social Security Number ([id number]) and Date of Birth (mm/dd/yyyy) as indicated and click Submit. You will be taken to the next sign-up page. 5. Create a MyChart ID. This will be your MyChart login ID and cannot be changed, so think of one that is secure and easy to remember. 6. Create a MyChart password. You can change your password at any time. 7. Enter your Password Reset Question and Answer. This can be used at a later time if you forget your password.  8. Enter your e-mail address. You will receive e-mail notification when new information is available in MyChart. 9. Click Sign  Up. You can now view your medical record.   Additional Information Remember, MyChart is NOT to be used for urgent needs. For medical emergencies, dial 911.

## 2012-06-23 NOTE — Telephone Encounter (Signed)
Pt was seen by Dr. Emelda Fear here in office on 06/23/12.

## 2012-06-24 ENCOUNTER — Inpatient Hospital Stay (HOSPITAL_COMMUNITY): Admission: RE | Admit: 2012-06-24 | Payer: Medicare Other | Source: Ambulatory Visit

## 2012-06-30 ENCOUNTER — Telehealth: Payer: Self-pay | Admitting: Obstetrics and Gynecology

## 2012-06-30 NOTE — Telephone Encounter (Signed)
Spoke with pt. Pt states Dr. Emelda Fear said if he hasn't talked to pt by today(Wednesday), to call him back. Pt thinks it may be regarding surgery. Please call pt back today.

## 2012-07-06 ENCOUNTER — Ambulatory Visit (HOSPITAL_COMMUNITY): Payer: Medicare Other

## 2012-07-07 ENCOUNTER — Ambulatory Visit (HOSPITAL_COMMUNITY)
Admission: RE | Admit: 2012-07-07 | Discharge: 2012-07-07 | Disposition: A | Discharge status: Home / Self Care | Payer: Medicare Other | Source: Ambulatory Visit | Attending: Internal Medicine | Admitting: Internal Medicine

## 2012-07-07 DIAGNOSIS — Z1231 Encounter for screening mammogram for malignant neoplasm of breast: Secondary | ICD-10-CM | POA: Diagnosis not present

## 2012-07-07 DIAGNOSIS — Z139 Encounter for screening, unspecified: Secondary | ICD-10-CM

## 2012-07-09 NOTE — Telephone Encounter (Signed)
I have scheduled pt for Ablation on 08/17/12.  [facility] for [facility] to return my call re: her procedure

## 2012-07-21 ENCOUNTER — Telehealth: Payer: Self-pay

## 2012-07-21 NOTE — Telephone Encounter (Signed)
Called pt to triage for TCS. She is having some constipation and has OV with Gerrit Halls, NP on 08/03/2012 at 2:00 PM.

## 2012-08-03 ENCOUNTER — Telehealth: Payer: Self-pay | Admitting: Gastroenterology

## 2012-08-03 ENCOUNTER — Ambulatory Visit: Payer: Medicare Other | Admitting: Gastroenterology

## 2012-08-03 NOTE — Telephone Encounter (Signed)
Pt was a no show

## 2012-08-04 ENCOUNTER — Encounter (HOSPITAL_COMMUNITY): Payer: Self-pay | Admitting: Pharmacy Technician

## 2012-08-04 NOTE — Telephone Encounter (Signed)
Please send letter for f/u.  

## 2012-08-05 ENCOUNTER — Ambulatory Visit (INDEPENDENT_AMBULATORY_CARE_PROVIDER_SITE_OTHER): Payer: Medicare Other | Admitting: Obstetrics and Gynecology

## 2012-08-05 ENCOUNTER — Other Ambulatory Visit (HOSPITAL_COMMUNITY)
Admission: RE | Admit: 2012-08-05 | Discharge: 2012-08-05 | Disposition: A | Discharge status: Home / Self Care | Payer: Medicare Other | Source: Ambulatory Visit | Attending: Obstetrics and Gynecology | Admitting: Obstetrics and Gynecology

## 2012-08-05 ENCOUNTER — Encounter: Payer: Self-pay | Admitting: Obstetrics and Gynecology

## 2012-08-05 VITALS — BP 134/98 | Ht 60.0 in | Wt 149.0 lb

## 2012-08-05 DIAGNOSIS — Z8619 Personal history of other infectious and parasitic diseases: Secondary | ICD-10-CM | POA: Diagnosis not present

## 2012-08-05 DIAGNOSIS — D259 Leiomyoma of uterus, unspecified: Secondary | ICD-10-CM | POA: Diagnosis not present

## 2012-08-05 DIAGNOSIS — Z113 Encounter for screening for infections with a predominantly sexual mode of transmission: Secondary | ICD-10-CM | POA: Insufficient documentation

## 2012-08-05 DIAGNOSIS — Z01419 Encounter for gynecological examination (general) (routine) without abnormal findings: Secondary | ICD-10-CM | POA: Insufficient documentation

## 2012-08-05 DIAGNOSIS — Z1151 Encounter for screening for human papillomavirus (HPV): Secondary | ICD-10-CM | POA: Insufficient documentation

## 2012-08-05 DIAGNOSIS — N949 Unspecified condition associated with female genital organs and menstrual cycle: Secondary | ICD-10-CM | POA: Diagnosis not present

## 2012-08-05 DIAGNOSIS — Z124 Encounter for screening for malignant neoplasm of cervix: Secondary | ICD-10-CM

## 2012-08-05 DIAGNOSIS — N920 Excessive and frequent menstruation with regular cycle: Secondary | ICD-10-CM | POA: Diagnosis not present

## 2012-08-05 DIAGNOSIS — A599 Trichomoniasis, unspecified: Secondary | ICD-10-CM | POA: Diagnosis not present

## 2012-08-05 DIAGNOSIS — A5901 Trichomonal vulvovaginitis: Secondary | ICD-10-CM | POA: Diagnosis not present

## 2012-08-05 NOTE — Patient Instructions (Signed)
Please keep ear June 4 appointment for preoperative blood work. Your surgery scheduled for June 9 according to what she been told

## 2012-08-05 NOTE — Progress Notes (Addendum)
Is scheduled for hysteroscopy D& C with endometrial ablation.    PAP SMEAR POSITIVE FOR TRICHOMONAS, WILL HAVE OFFICE CONTACT PT , RX FOR METRONIDAZOLE E-rX'D TO PHARM.

## 2012-08-11 ENCOUNTER — Encounter (HOSPITAL_COMMUNITY)
Admission: RE | Admit: 2012-08-11 | Discharge: 2012-08-11 | Disposition: A | Discharge status: Home / Self Care | Payer: Medicare Other | Source: Ambulatory Visit | Attending: Obstetrics and Gynecology | Admitting: Obstetrics and Gynecology

## 2012-08-11 ENCOUNTER — Other Ambulatory Visit: Payer: Self-pay | Admitting: Obstetrics and Gynecology

## 2012-08-11 MED ORDER — METRONIDAZOLE 500 MG PO TABS: 500.0000 mg | ORAL_TABLET | Freq: Two times a day (BID) | ORAL | Status: DC

## 2012-08-11 NOTE — Addendum Note (Signed)
Addended by: Tilda Burrow on: 08/11/2012 06:26 PM   Modules accepted: Orders

## 2012-08-11 NOTE — Patient Instructions (Signed)
Julia [name]  08/11/2012   Your procedure is scheduled on:  08/19/12  Report to [facility] at [place] [facility].  Call this number if you have problems the morning of surgery: [phone]   Remember:   Do not eat food or drink liquids after midnight.   Take these medicines the morning of surgery with A SIP OF WATER: xanax, pain pill   Do not wear jewelry, make-up or nail polish.  Do not wear lotions, powders, or perfumes. You may wear deodorant.  Do not shave 48 hours prior to surgery. Men may shave face and neck.  Do not bring valuables to the hospital.  [facility] is not responsible                   for any belongings or valuables.  Contacts, dentures or bridgework may not be worn into surgery.  Leave suitcase in the car. After surgery it may be brought to your room.  For patients admitted to the hospital, checkout time is 11:00 AM the day of  discharge.   Patients discharged the day of surgery will not be allowed to drive  home.  Name and phone number of your driver: family  Special Instructions: Shower using CHG 2 nights before surgery and the night before surgery.  If you shower the day of surgery use CHG.  Use special wash - you have one bottle of CHG for all showers.  You should use approximately 1/3 of the bottle for each shower.   Please read over the following fact sheets that you were given: Pain Booklet, Coughing and Deep Breathing, MRSA Information, Surgical Site Infection Prevention, Anesthesia Post-op Instructions and Care and Recovery After Surgery   PATIENT INSTRUCTIONS POST-ANESTHESIA  IMMEDIATELY FOLLOWING SURGERY:  Do not drive or operate machinery for the first twenty four hours after surgery.  Do not make any important decisions for twenty four hours after surgery or while taking narcotic pain medications or sedatives.  If you develop intractable nausea and vomiting or a severe headache please notify your doctor immediately.  FOLLOW-UP:  Please make an appointment with  your surgeon as instructed. You do not need to follow up with anesthesia unless specifically instructed to do so.  WOUND CARE INSTRUCTIONS (if applicable):  Keep a dry clean dressing on the anesthesia/puncture wound site if there is drainage.  Once the wound has quit draining you may leave it open to air.  Generally you should leave the bandage intact for twenty four hours unless there is drainage.  If the epidural site drains for more than 36-48 hours please call the anesthesia department.  QUESTIONS?:  Please feel free to call your physician or the hospital operator if you have any questions, and they will be happy to assist you.      Dilation and Curettage or Vacuum Curettage Dilation and curettage (D&C) and vacuum curettage are minor procedures. A D&C involves stretching (dilation) the cervix and scraping (curettage) the inside lining of the womb (uterus). During a D&C, tissue is gently scraped from the inside lining of the uterus. During a vacuum curettage, the lining and tissue in the uterus are removed with the use of gentle suction. Curettage may be performed for diagnostic or therapeutic purposes. As a diagnostic procedure, curettage is performed for the purpose of examining tissues from the uterus. Tissue examination may help determine causes or treatment options for symptoms. A diagnostic curettage may be performed for the following symptoms:  Irregular bleeding in the uterus.  Bleeding with the development of clots.  Spotting between menstrual periods.  Prolonged menstrual periods.  Bleeding after menopause.  No menstrual period (amenorrhea).  A change in size and shape of the uterus. A therapeutic curettage is performed to remove tissue, blood, or a contraceptive device. Therapeutic curettage may be performed for the following conditions:   Removal of an IUD (intrauterine device).  Removal of retained placenta after giving birth. Retained placenta can cause bleeding severe  enough to require transfusions or an infection.  Abortion.  Miscarriage.  Removal of polyps inside the uterus.  Removal of uncommon types of fibroids (noncancerous lumps). LET YOUR CAREGIVER KNOW ABOUT:   Allergies to food or medicine.  Medicines taken, including vitamins, herbs, eyedrops, over-the-counter medicines, and creams.  Use of steroids (by mouth or creams).  Previous problems with anesthetics or numbing medicines.  History of bleeding problems or blood clots.  Previous surgery.  Other health problems, including diabetes and kidney problems.  Possibility of pregnancy, if this applies. RISKS AND COMPLICATIONS   Excessive bleeding.  Infection of the uterus.  Damage to the cervix.  Development of scar tissue (adhesions) inside the uterus, later causing abnormal amounts of menstrual bleeding.  Complications from the general anesthetic, if a general anesthetic is used.  Putting a hole (perforation) in the uterus. This is rare. BEFORE THE PROCEDURE   Eat and drink before the procedure only as directed by your caregiver.  Arrange for someone to take you home. PROCEDURE   This procedure may be done in a hospital, outpatient clinic, or caregiver's office.  You may be given a general anesthetic or a local anesthetic in and around the cervix.  You will lie on your back with your legs in stirrups.  There are two ways in which your cervix can be softened and dilated. These include:  Taking a medicine.  Having thin rods (laminaria) inserted into your cervix.  A curved tool (curette) will scrape cells from the inside lining of the uterus and will then be removed. This procedure usually takes about 15 to 30 minutes. AFTER THE PROCEDURE   You will rest in the recovery area until you are stable and are ready to go home.  You will need to have someone take you home.  You may feel sick to your stomach (nauseous) or throw up (vomit) if you had general  anesthesia.  You may have a sore throat if a tube was placed in your throat during general anesthesia.  You may have light cramping and bleeding for 2 days to 2 weeks after the procedure.  Your uterus needs to make a new lining after the procedure. This may make your next period late. Document Released: 02/24/2005 Document Revised: 05/19/2011 Document Reviewed: 09/22/2008 [facility] Patient Information 2014 [place], [place].

## 2012-08-12 NOTE — Progress Notes (Addendum)
Patient ID: Julia [name], female   DOB: [birth year], 52 y.o.   MRN: [id number] Pt aware of results. Pt aware to go get medication from pharmacy and start today and take for 7 days. Pt also aware that her surgery will be put off a few weeks. Pt aware she needs to make a follow up appointment for two weeks for proof of cure.  Pt aware to pick up information sheet at front office.

## 2012-08-13 ENCOUNTER — Encounter: Payer: Self-pay | Admitting: Gastroenterology

## 2012-08-13 NOTE — Telephone Encounter (Signed)
MAILED LETTER °

## 2012-08-16 ENCOUNTER — Encounter (HOSPITAL_COMMUNITY)
Admission: RE | Admit: 2012-08-16 | Discharge: 2012-08-16 | Disposition: A | Discharge status: Home / Self Care | Payer: Medicare Other | Source: Ambulatory Visit

## 2012-08-16 ENCOUNTER — Telehealth: Payer: Self-pay | Admitting: Obstetrics and Gynecology

## 2012-08-16 DIAGNOSIS — G8929 Other chronic pain: Secondary | ICD-10-CM | POA: Diagnosis not present

## 2012-08-16 DIAGNOSIS — E538 Deficiency of other specified B group vitamins: Secondary | ICD-10-CM | POA: Diagnosis not present

## 2012-08-16 DIAGNOSIS — K5909 Other constipation: Secondary | ICD-10-CM | POA: Diagnosis not present

## 2012-08-16 DIAGNOSIS — Z6829 Body mass index (BMI) 29.0-29.9, adult: Secondary | ICD-10-CM | POA: Diagnosis not present

## 2012-08-16 MED ORDER — MEGESTROL ACETATE 40 MG PO TABS: 40.0000 mg | ORAL_TABLET | Freq: Every day | ORAL | Status: DC

## 2012-08-16 NOTE — Telephone Encounter (Signed)
Spoke with pt. Bleeding and cramping since Saturday. Advil and Hydrocodone not helping. Having pain in stomach and back. Can you give med to make bleeding stop? Uses [employer] .

## 2012-08-16 NOTE — Telephone Encounter (Signed)
Megace called in to [facility]

## 2012-08-16 NOTE — Telephone Encounter (Signed)
Routed to Dr. Ferguson. JSY 

## 2012-08-17 ENCOUNTER — Telehealth: Payer: Self-pay | Admitting: Obstetrics and Gynecology

## 2012-08-17 NOTE — Telephone Encounter (Signed)
Spoke with pt and let her know Megace had been called into [facility] per Dr. Emelda Fear. (Dr. Emelda Fear wrote [facility] but it was actually [employer] ). Pt has a follow up appt scheduled for June 23.

## 2012-08-19 ENCOUNTER — Encounter (HOSPITAL_COMMUNITY): Admission: RE | Payer: Self-pay | Source: Ambulatory Visit

## 2012-08-19 ENCOUNTER — Ambulatory Visit (HOSPITAL_COMMUNITY)
Admission: RE | Admit: 2012-08-19 | Payer: Medicare Other | Source: Ambulatory Visit | Admitting: Obstetrics and Gynecology

## 2012-08-19 SURGERY — DILATATION & CURETTAGE/HYSTEROSCOPY WITH THERMACHOICE ABLATION
Anesthesia: General

## 2012-08-25 ENCOUNTER — Encounter: Payer: Medicare Other | Admitting: Obstetrics and Gynecology

## 2012-08-30 ENCOUNTER — Ambulatory Visit (INDEPENDENT_AMBULATORY_CARE_PROVIDER_SITE_OTHER): Payer: Medicare Other | Admitting: Obstetrics and Gynecology

## 2012-08-30 ENCOUNTER — Encounter: Payer: Medicare Other | Admitting: Obstetrics and Gynecology

## 2012-08-30 ENCOUNTER — Encounter: Payer: Self-pay | Admitting: Obstetrics and Gynecology

## 2012-08-30 VITALS — BP 124/80 | Ht 65.0 in | Wt 148.8 lb

## 2012-08-30 DIAGNOSIS — D259 Leiomyoma of uterus, unspecified: Secondary | ICD-10-CM | POA: Diagnosis not present

## 2012-08-30 DIAGNOSIS — A599 Trichomoniasis, unspecified: Secondary | ICD-10-CM | POA: Diagnosis not present

## 2012-08-30 DIAGNOSIS — N920 Excessive and frequent menstruation with regular cycle: Secondary | ICD-10-CM | POA: Diagnosis not present

## 2012-08-30 DIAGNOSIS — N949 Unspecified condition associated with female genital organs and menstrual cycle: Secondary | ICD-10-CM | POA: Diagnosis not present

## 2012-08-30 DIAGNOSIS — A5901 Trichomonal vulvovaginitis: Secondary | ICD-10-CM | POA: Diagnosis not present

## 2012-08-30 DIAGNOSIS — Z124 Encounter for screening for malignant neoplasm of cervix: Secondary | ICD-10-CM | POA: Diagnosis not present

## 2012-08-30 DIAGNOSIS — Z8619 Personal history of other infectious and parasitic diseases: Secondary | ICD-10-CM

## 2012-08-30 NOTE — Progress Notes (Signed)
   [facility] Visit  Patient name: Julia [name] MRN [id number]  Date of birth: Jan 28, 1961  CC & HPI:  CHASITY OUTTEN is a 52 y.o. female presenting today for followup proof of cure of Trich noted on  Pap. No sex in 3 yrs.  ROS:  Pt noting SOB over last 2 wk. No PND, NO fever cough cold  Pertinent History Reviewed:  Medical & Surgical Hx:  Reviewed: Significant for  Medications: Reviewed & Updated - see associated section Social History: Reviewed -  reports that she has quit smoking. Her smoking use included Cigarettes. She smoked 0.00 packs per day. She does not have any smokeless tobacco history on file.  Objective Findings:  Vitals: BP 124/80  Ht 5\' 5"  (1.651 m)  Wt 148 lb 12.8 oz (67.495 kg)  BMI 24.76 kg/m2  LMP 07/22/2012  Physical Examination: General appearance - alert, well appearing, and in no distress and oriented to person, place, and time Mental status - alert, oriented to person, place, and time, normal mood, behavior, speech, dress, motor activity, and thought processes Chest - clear to auscultation, no wheezes, rales or rhonchi, symmetric air entry, no tachypnea, retractions or cyanosis Abdomen - soft, nontender, nondistended, no masses or organomegaly  Efg no dichg, and KoH, Wet prep NOMal.  Assessment & Plan:   Proof of cure of trich Stable menses at present. Plan ; d/c megace Expect spont mense in another month. Keep menstrual calendar.x  6 months

## 2012-08-31 DIAGNOSIS — A5901 Trichomonal vulvovaginitis: Secondary | ICD-10-CM | POA: Insufficient documentation

## 2012-09-02 ENCOUNTER — Other Ambulatory Visit (HOSPITAL_COMMUNITY): Payer: Self-pay | Admitting: Internal Medicine

## 2012-09-02 ENCOUNTER — Ambulatory Visit (HOSPITAL_COMMUNITY)
Admission: RE | Admit: 2012-09-02 | Discharge: 2012-09-02 | Disposition: A | Discharge status: Home / Self Care | Payer: Medicare Other | Source: Ambulatory Visit | Attending: Internal Medicine | Admitting: Internal Medicine

## 2012-09-02 ENCOUNTER — Ambulatory Visit: Payer: Medicare Other | Admitting: Obstetrics and Gynecology

## 2012-09-02 DIAGNOSIS — R0602 Shortness of breath: Secondary | ICD-10-CM | POA: Diagnosis not present

## 2012-09-02 DIAGNOSIS — I10 Essential (primary) hypertension: Secondary | ICD-10-CM | POA: Diagnosis not present

## 2012-09-02 DIAGNOSIS — J42 Unspecified chronic bronchitis: Secondary | ICD-10-CM | POA: Diagnosis not present

## 2012-09-02 DIAGNOSIS — F411 Generalized anxiety disorder: Secondary | ICD-10-CM | POA: Diagnosis not present

## 2012-09-02 DIAGNOSIS — Z6829 Body mass index (BMI) 29.0-29.9, adult: Secondary | ICD-10-CM | POA: Diagnosis not present

## 2012-09-30 ENCOUNTER — Other Ambulatory Visit: Payer: Self-pay | Admitting: Gastroenterology

## 2012-09-30 ENCOUNTER — Encounter: Payer: Self-pay | Admitting: Gastroenterology

## 2012-09-30 ENCOUNTER — Ambulatory Visit (INDEPENDENT_AMBULATORY_CARE_PROVIDER_SITE_OTHER): Payer: Medicare Other | Admitting: Gastroenterology

## 2012-09-30 VITALS — BP 129/83 | HR 112 | Temp 98.1°F | Ht 65.0 in | Wt 155.4 lb

## 2012-09-30 DIAGNOSIS — K5903 Drug induced constipation: Secondary | ICD-10-CM | POA: Insufficient documentation

## 2012-09-30 DIAGNOSIS — K5909 Other constipation: Secondary | ICD-10-CM

## 2012-09-30 DIAGNOSIS — Z1211 Encounter for screening for malignant neoplasm of colon: Secondary | ICD-10-CM | POA: Diagnosis not present

## 2012-09-30 DIAGNOSIS — K5904 Chronic idiopathic constipation: Secondary | ICD-10-CM

## 2012-09-30 MED ORDER — LINACLOTIDE 145 MCG PO CAPS: ORAL_CAPSULE | ORAL | Status: DC

## 2012-09-30 MED ORDER — PEG-KCL-NACL-NASULF-NA ASC-C 100 G PO SOLR: 1.0000 | ORAL | Status: DC

## 2012-09-30 NOTE — Patient Instructions (Signed)
COLONOSCOPY WITHIN THE NEXT 2-3 WEEKS  ADD LINZESS 145 MCG DAILY. IT CAN CAUSE DIARRHEA  DO NOT DRINK YOUR CALORIES.   FOLLOW A HIGH FIBER/LOW FAT DIET. AVOID ITEMS THAT CAUSE BLOATING AND GAS. SEE INFO BELOW.  DRINK WATER TO KEEP YOUR URINE LIGHT YELLOW.  FOLLOW UP IN NOV 2014.  High-Fiber Diet A high-fiber diet changes your normal diet to include more whole grains, legumes, fruits, and vegetables. Changes in the diet involve replacing refined carbohydrates with unrefined foods. The calorie level of the diet is essentially unchanged. The Dietary Reference Intake (recommended amount) for adult males is 38 grams per day. For adult females, it is 25 grams per day. Pregnant and lactating women should consume 28 grams of fiber per day. Fiber is the intact part of a plant that is not broken down during digestion. Functional fiber is fiber that has been isolated from the plant to provide a beneficial effect in the body. PURPOSE  Increase stool bulk.   Ease and regulate bowel movements.   Lower cholesterol.  INDICATIONS THAT YOU NEED MORE FIBER  Constipation and hemorrhoids.   Uncomplicated diverticulosis (intestine condition) and irritable bowel syndrome.   Weight management.   As a protective measure against hardening of the arteries (atherosclerosis), diabetes, and cancer.   DO NOT USE WITH:  Acute diverticulitis (intestine infection).   Partial small bowel obstructions.   Complicated diverticular disease involving bleeding, rupture (perforation), or abscess (boil, furuncle).   Presence of autonomic neuropathy (nerve damage) or gastroparesis (stomach cannot empty itself).    GUIDELINES FOR INCREASING FIBER IN THE DIET  Start adding fiber to the diet slowly. A gradual increase of about 5 more grams (2 slices of whole-wheat bread, 2 servings of most fruits or vegetables, or 1 bowl of high-fiber cereal) per day is best. Too rapid an increase in fiber may result in constipation,  flatulence, and bloating.   Drink enough water and fluids to keep your urine clear or pale yellow. Water, juice, or caffeine-free drinks are recommended. Not drinking enough fluid may cause constipation.   Eat a variety of high-fiber foods rather than one type of fiber.   Try to increase your intake of fiber through using high-fiber foods rather than fiber pills or supplements that contain small amounts of fiber.   The goal is to change the types of food eaten. Do not supplement your present diet with high-fiber foods, but replace foods in your present diet.    INCLUDE A VARIETY OF FIBER SOURCES  Replace refined and processed grains with whole grains, canned fruits with fresh fruits, and incorporate other fiber sources. White rice, white breads, and most bakery goods contain little or no fiber.   Brown whole-grain rice, buckwheat oats, and many fruits and vegetables are all good sources of fiber. These include: broccoli, Brussels sprouts, cabbage, cauliflower, beets, sweet potatoes, white potatoes (skin on), carrots, tomatoes, eggplant, squash, berries, fresh fruits, and dried fruits.   Cereals appear to be the richest source of fiber. Cereal fiber is found in whole grains and bran. Bran is the fiber-rich outer coat of cereal grain, which is largely removed in refining. In whole-grain cereals, the bran remains. In breakfast cereals, the largest amount of fiber is found in those with "bran" in their names. The fiber content is sometimes indicated on the label.   You may need to include additional fruits and vegetables each day.   In baking, for 1 cup white flour, you may use the following substitutions:  1 cup whole-wheat flour minus 2 tablespoons.   1/2 cup white flour plus 1/2 cup whole-wheat flour.   Low-Fat Diet BREADS, CEREALS, PASTA, RICE, DRIED PEAS, AND BEANS These products are high in carbohydrates and most are low in fat. Therefore, they can be increased in the diet as  substitutes for fatty foods. They too, however, contain calories and should not be eaten in excess. Cereals can be eaten for snacks as well as for breakfast.   FRUITS AND VEGETABLES It is good to eat fruits and vegetables. Besides being sources of fiber, both are rich in vitamins and some minerals. They help you get the daily allowances of these nutrients. Fruits and vegetables can be used for snacks and desserts.  MEATS Limit lean meat, chicken, [place], and fish to no more than 6 ounces per day. Beef, Pork, and Lamb Use lean cuts of beef, pork, and lamb. Lean cuts include:  Extra-lean ground beef.  Arm roast.  Sirloin tip.  Center-cut ham.  Round steak.  Loin chops.  Rump roast.  Tenderloin.  Trim all fat off the outside of meats before cooking. It is not necessary to severely decrease the intake of red meat, but lean choices should be made. Lean meat is rich in protein and contains a highly absorbable form of iron. Premenopausal women, in particular, should avoid reducing lean red meat because this could increase the risk for low red blood cells (iron-deficiency anemia).  Chicken and [place] These are good sources of protein. The fat of poultry can be reduced by removing the skin and underlying fat layers before cooking. Chicken and [place] can be substituted for lean red meat in the diet. Poultry should not be fried or covered with high-fat sauces. Fish and Shellfish Fish is a good source of protein. Shellfish contain cholesterol, but they usually are low in saturated fatty acids. The preparation of fish is important. Like chicken and [place], they should not be fried or covered with high-fat sauces. EGGS Egg whites contain no fat or cholesterol. They can be eaten often. Try 1 to 2 egg whites instead of whole eggs in recipes or use egg substitutes that do not contain yolk. MILK AND DAIRY PRODUCTS Use skim or 1% milk instead of 2% or whole milk. Decrease whole milk, natural, and processed  cheeses. Use nonfat or low-fat (2%) cottage cheese or low-fat cheeses made from vegetable oils. Choose nonfat or low-fat (1 to 2%) yogurt. Experiment with evaporated skim milk in recipes that call for heavy cream. Substitute low-fat yogurt or low-fat cottage cheese for sour cream in dips and salad dressings. Have at least 2 servings of low-fat dairy products, such as 2 glasses of skim (or 1%) milk each day to help get your daily calcium intake. FATS AND OILS Reduce the total intake of fats, especially saturated fat. Butterfat, lard, and beef fats are high in saturated fat and cholesterol. These should be avoided as much as possible. Vegetable fats do not contain cholesterol, but certain vegetable fats, such as coconut oil, palm oil, and palm kernel oil are very high in saturated fats. These should be limited. These fats are often used in bakery goods, processed foods, popcorn, oils, and nondairy creamers. Vegetable shortenings and some peanut butters contain hydrogenated oils, which are also saturated fats. Read the labels on these foods and check for saturated vegetable oils. Unsaturated vegetable oils and fats do not raise blood cholesterol. However, they should be limited because they are fats and are high in calories. Total  fat should still be limited to 30% of your daily caloric intake. Desirable liquid vegetable oils are corn oil, cottonseed oil, olive oil, canola oil, safflower oil, soybean oil, and sunflower oil. Peanut oil is not as good, but small amounts are acceptable. Buy a heart-healthy tub margarine that has no partially hydrogenated oils in the ingredients. Mayonnaise and salad dressings often are made from unsaturated fats, but they should also be limited because of their high calorie and fat content. Seeds, nuts, peanut butter, olives, and avocados are high in fat, but the fat is mainly the unsaturated type. These foods should be limited mainly to avoid excess calories and fat. OTHER EATING  TIPS Snacks  Most sweets should be limited as snacks. They tend to be rich in calories and fats, and their caloric content outweighs their nutritional value. Some good choices in snacks are graham crackers, melba toast, soda crackers, bagels (no egg), English muffins, fruits, and vegetables. These snacks are preferable to snack crackers, [place] fries, TORTILLA CHIPS, and POTATO chips. Popcorn should be air-popped or cooked in small amounts of liquid vegetable oil. Desserts Eat fruit, low-fat yogurt, and fruit ices instead of pastries, cake, and cookies. Sherbet, angel food cake, gelatin dessert, frozen low-fat yogurt, or other frozen products that do not contain saturated fat (pure fruit juice bars, frozen ice pops) are also acceptable.  COOKING METHODS Choose those methods that use little or no fat. They include: Poaching.  Braising.  Steaming.  Grilling.  Baking.  Stir-frying.  Broiling.  Microwaving.  Foods can be cooked in a nonstick pan without added fat, or use a nonfat cooking spray in regular cookware. Limit fried foods and avoid frying in saturated fat. Add moisture to lean meats by using water, broth, cooking wines, and other nonfat or low-fat sauces along with the cooking methods mentioned above. Soups and stews should be chilled after cooking. The fat that forms on top after a few hours in the refrigerator should be skimmed off. When preparing meals, avoid using excess salt. Salt can contribute to raising blood pressure in some people.  EATING AWAY FROM HOME Order entres, potatoes, and vegetables without sauces or butter. When meat exceeds the size of a deck of cards (3 to 4 ounces), the rest can be taken home for another meal. Choose vegetable or fruit salads and ask for low-calorie salad dressings to be served on the side. Use dressings sparingly. Limit high-fat toppings, such as bacon, crumbled eggs, cheese, sunflower seeds, and olives. Ask for heart-healthy tub margarine instead  of butter.

## 2012-09-30 NOTE — Assessment & Plan Note (Addendum)
AVERAGE RISK   DISCUSSED PROCEDURE, BENEFITS, AND RISKS OF PROCEDURE. TCS WITH PROPOFOL DUE TO POLYPHARMACY-MOVIPREP

## 2012-09-30 NOTE — Progress Notes (Signed)
  Subjective:    Patient ID: Julia [name], female    DOB: [birth year], 52 y.o.   MRN: [id number]  Julia [name]., MD  HPI CONSTIPATION FOR GOOD WHILE. BM: 01-X/WEEK. DOESN'T DRINK MUCH WATER OR EAT MUCH FIBER. BELLY FEELS TIGHT WHEN SHE DOESN'T HAVE A BM. GAINED WEIGHT: PT DENIES FEVER, CHILLS, BRBPR, nausea, vomiting, melena, diarrhea, abd pain, problems swallowing, problems with sedation, heartburn or indigestion.     Past Medical History  Diagnosis Date  . Hypertension   . Anxiety     Past Surgical History  Procedure Laterality Date  . Tubal ligation       Allergies  Allergen Reactions  . Naprosyn (Naproxen) Nausea Only  . Promethazine Hcl Nausea Only   Current Outpatient Prescriptions  Medication Sig Dispense Refill  . ALPRAZolam (XANAX) 1 MG tablet Take 1 mg by mouth 4 (four) times daily as needed for anxiety.   1-2/DAY    . amitriptyline (ELAVIL) 25 MG tablet Take 25 mg by mouth at bedtime.     FOR GOOD WHILE  . aspirin EC 325 MG tablet Take 325 mg by mouth daily.      . ferrous sulfate 325 (65 FE) MG tablet Take 325 mg by mouth at bedtime.    FOR A GOOD WHILE  . hydrochlorothiazide (MICROZIDE) 12.5 MG capsule Take 12.5 mg by mouth daily.       Marland Kitchen HYDROcodone-acetaminophen (NORCO/VICODIN) 5-325 MG per tablet Take 1-2 tablets by mouth every 6 (six) hours as needed for pain.  1-2X/DAY  3-4 TIMES/WK  . metroNIDAZOLE (FLAGYL) 500 MG tablet Take 1 tablet (500 mg total) by mouth 2 (two) times daily. PARTNER NEEDS TREATMENT ALSO.  14 tablet  1  . PARoxetine (PAXIL) 20 MG tablet Take 20 mg by mouth at bedtime.      . pravastatin (PRAVACHOL) 40 MG tablet Take 40 mg by mouth daily.       Family History  Problem Relation Age of Onset  . Stroke Mother   . Heart failure Mother   . Cancer Father     lung  . Diabetes Brother    NO COLON CA OR POLYPS  History  Substance Use Topics  . Smoking status: Former Smoker    Types: Cigarettes  . Smokeless tobacco: Not on file   . Alcohol Use: No         Review of Systems PER HPI OTHERWISE ALL SYSTEMS ARE NEGATIVE.     Objective:   Physical Exam  Vitals reviewed. Constitutional: She is oriented to person, place, and time. She appears well-nourished. No distress.  HENT:  Head: Normocephalic and atraumatic.  Mouth/Throat: Oropharynx is clear and moist. No oropharyngeal exudate.  Eyes: Pupils are equal, round, and reactive to light. No scleral icterus.  Neck: Normal range of motion. Neck supple.  Cardiovascular: Normal rate, regular rhythm and normal heart sounds.   Pulmonary/Chest: Effort normal and breath sounds normal. No respiratory distress.  Abdominal: Soft. Bowel sounds are normal. She exhibits no distension. There is no tenderness.  Musculoskeletal: Normal range of motion. She exhibits no edema.  Lymphadenopathy:    She has no cervical adenopathy.  Neurological: She is alert and oriented to person, place, and time.  NO  NEW FOCAL DEFICITS  RESTING TREMOR  Psychiatric:  FLAT AFFECT, NL MOOD           Assessment & Plan:

## 2012-09-30 NOTE — Progress Notes (Signed)
CC PCP 

## 2012-09-30 NOTE — Assessment & Plan Note (Addendum)
SX EXCERBATED BY MEDS AND DIET  DRINK WATER EAT LOW FAT/HIGH FIBER DIET ADD LINZESS. HOLD FOR DIARRHEA OPV IN 4 MOS

## 2012-09-30 NOTE — Addendum Note (Signed)
Addended by: [place] on: 09/30/2012 03:46 PM   Modules accepted: Orders

## 2012-10-08 NOTE — Progress Notes (Signed)
Reminder in epic °

## 2012-10-12 DIAGNOSIS — I10 Essential (primary) hypertension: Secondary | ICD-10-CM | POA: Diagnosis not present

## 2012-10-12 DIAGNOSIS — Z6829 Body mass index (BMI) 29.0-29.9, adult: Secondary | ICD-10-CM | POA: Diagnosis not present

## 2012-10-12 DIAGNOSIS — G8929 Other chronic pain: Secondary | ICD-10-CM | POA: Diagnosis not present

## 2012-10-12 DIAGNOSIS — D510 Vitamin B12 deficiency anemia due to intrinsic factor deficiency: Secondary | ICD-10-CM | POA: Diagnosis not present

## 2012-10-14 NOTE — Patient Instructions (Addendum)
Julia [name]  10/14/2012   Your procedure is scheduled on:  10/19/2012  Report to [facility] at  715  AM.  Call this number if you have problems the morning of surgery: [phone]   Remember:   Do not eat food or drink liquids after midnight.   Take these medicines the morning of surgery with A SIP OF WATER:  Xanax, elavil, microzide, norco, paxil   Do not wear jewelry, make-up or nail polish.  Do not wear lotions, powders, or perfumes.   Do not shave 48 hours prior to surgery. Men may shave face and neck.  Do not bring valuables to the hospital.  [facility] is not responsible  for any belongings or valuables.  Contacts, dentures or bridgework may not be worn into surgery.  Leave suitcase in the car. After surgery it may be brought to your room.  For patients admitted to the hospital, checkout time is 11:00 AM the day of discharge.   Patients discharged the day of surgery will not be allowed to drive home.  Name and phone number of your driver: family  Special Instructions: N/A   Please read over the following fact sheets that you were given: Pain Booklet, Coughing and Deep Breathing, Surgical Site Infection Prevention, Anesthesia Post-op Instructions and Care and Recovery After Surgery Colonoscopy A colonoscopy is an exam to evaluate your entire colon. In this exam, your colon is cleansed. A long fiberoptic tube is inserted through your rectum and into your colon. The fiberoptic scope (endoscope) is a long bundle of enclosed and very flexible fibers. These fibers transmit light to the area examined and send images from that area to your caregiver. Discomfort is usually minimal. You may be given a drug to help you sleep (sedative) during or prior to the procedure. This exam helps to detect lumps (tumors), polyps, inflammation, and areas of bleeding. Your caregiver may also take a small piece of tissue (biopsy) that will be examined under a microscope. LET YOUR CAREGIVER KNOW ABOUT:    Allergies to food or medicine.  Medicines taken, including vitamins, herbs, eyedrops, over-the-counter medicines, and creams.  Use of steroids (by mouth or creams).  Previous problems with anesthetics or numbing medicines.  History of bleeding problems or blood clots.  Previous surgery.  Other health problems, including diabetes and kidney problems.  Possibility of pregnancy, if this applies. BEFORE THE PROCEDURE   A clear liquid diet may be required for 2 days before the exam.  Ask your caregiver about changing or stopping your regular medications.  Liquid injections (enemas) or laxatives may be required.  A large amount of electrolyte solution may be given to you to drink over a short period of time. This solution is used to clean out your colon.  You should be present 60 minutes prior to your procedure or as directed by your caregiver. AFTER THE PROCEDURE   If you received a sedative or pain relieving medication, you will need to arrange for someone to drive you home.  Occasionally, there is a little blood passed with the first bowel movement. Do not be concerned. FINDING OUT THE RESULTS OF YOUR TEST Not all test results are available during your visit. If your test results are not back during the visit, make an appointment with your caregiver to find out the results. Do not assume everything is normal if you have not heard from your caregiver or the medical facility. It is important for you to follow  up on all of your test results. HOME CARE INSTRUCTIONS   It is not unusual to pass moderate amounts of gas and experience mild abdominal cramping following the procedure. This is due to air being used to inflate your colon during the exam. Walking or a warm pack on your belly (abdomen) may help.  You may resume all normal meals and activities after sedatives and medicines have worn off.  Only take over-the-counter or prescription medicines for pain, discomfort, or fever as  directed by your caregiver. Do not use aspirin or blood thinners if a biopsy was taken. Consult your caregiver for medicine usage if biopsies were taken. SEEK IMMEDIATE MEDICAL CARE IF:   You have a fever.  You pass large blood clots or fill a toilet with blood following the procedure. This may also occur 10 to 14 days following the procedure. This is more likely if a biopsy was taken.  You develop abdominal pain that keeps getting worse and cannot be relieved with medicine. Document Released: 02/22/2000 Document Revised: 05/19/2011 Document Reviewed: 10/07/2007 [facility] Patient Information 2014 [place]. PATIENT INSTRUCTIONS POST-ANESTHESIA  IMMEDIATELY FOLLOWING SURGERY:  Do not drive or operate machinery for the first twenty four hours after surgery.  Do not make any important decisions for twenty four hours after surgery or while taking narcotic pain medications or sedatives.  If you develop intractable nausea and vomiting or a severe headache please notify your doctor immediately.  FOLLOW-UP:  Please make an appointment with your surgeon as instructed. You do not need to follow up with anesthesia unless specifically instructed to do so.  WOUND CARE INSTRUCTIONS (if applicable):  Keep a dry clean dressing on the anesthesia/puncture wound site if there is drainage.  Once the wound has quit draining you may leave it open to air.  Generally you should leave the bandage intact for twenty four hours unless there is drainage.  If the epidural site drains for more than 36-48 hours please call the anesthesia department.  QUESTIONS?:  Please feel free to call your physician or the hospital operator if you have any questions, and they will be happy to assist you.

## 2012-10-15 ENCOUNTER — Encounter (HOSPITAL_COMMUNITY)
Admission: RE | Admit: 2012-10-15 | Discharge: 2012-10-15 | Disposition: A | Discharge status: Home / Self Care | Payer: Medicare Other | Source: Ambulatory Visit | Attending: Gastroenterology | Admitting: Gastroenterology

## 2012-10-19 ENCOUNTER — Ambulatory Visit (HOSPITAL_COMMUNITY): Admission: RE | Admit: 2012-10-19 | Payer: Medicare Other | Source: Ambulatory Visit | Admitting: Gastroenterology

## 2012-10-19 ENCOUNTER — Encounter (HOSPITAL_COMMUNITY): Admission: RE | Payer: Self-pay | Source: Ambulatory Visit

## 2012-10-19 SURGERY — COLONOSCOPY WITH PROPOFOL
Anesthesia: Monitor Anesthesia Care

## 2012-10-21 NOTE — Telephone Encounter (Signed)
Done

## 2012-11-03 DIAGNOSIS — IMO0002 Reserved for concepts with insufficient information to code with codable children: Secondary | ICD-10-CM | POA: Diagnosis not present

## 2012-11-03 DIAGNOSIS — G8929 Other chronic pain: Secondary | ICD-10-CM | POA: Diagnosis not present

## 2012-11-03 DIAGNOSIS — Z6830 Body mass index (BMI) 30.0-30.9, adult: Secondary | ICD-10-CM | POA: Diagnosis not present

## 2012-11-23 ENCOUNTER — Other Ambulatory Visit: Payer: Self-pay | Admitting: *Deleted

## 2012-11-23 ENCOUNTER — Ambulatory Visit: Payer: Medicare Other

## 2012-11-23 DIAGNOSIS — M545 Low back pain: Secondary | ICD-10-CM

## 2012-12-01 ENCOUNTER — Ambulatory Visit: Payer: Medicare Other | Admitting: Obstetrics and Gynecology

## 2012-12-09 ENCOUNTER — Ambulatory Visit: Payer: Medicare Other | Admitting: Obstetrics and Gynecology

## 2012-12-13 DIAGNOSIS — G43009 Migraine without aura, not intractable, without status migrainosus: Secondary | ICD-10-CM | POA: Diagnosis not present

## 2012-12-13 DIAGNOSIS — Z6830 Body mass index (BMI) 30.0-30.9, adult: Secondary | ICD-10-CM | POA: Diagnosis not present

## 2012-12-13 DIAGNOSIS — D510 Vitamin B12 deficiency anemia due to intrinsic factor deficiency: Secondary | ICD-10-CM | POA: Diagnosis not present

## 2012-12-13 DIAGNOSIS — G8929 Other chronic pain: Secondary | ICD-10-CM | POA: Diagnosis not present

## 2013-02-18 DIAGNOSIS — D510 Vitamin B12 deficiency anemia due to intrinsic factor deficiency: Secondary | ICD-10-CM | POA: Diagnosis not present

## 2013-02-18 DIAGNOSIS — G8929 Other chronic pain: Secondary | ICD-10-CM | POA: Diagnosis not present

## 2013-02-18 DIAGNOSIS — Z6831 Body mass index (BMI) 31.0-31.9, adult: Secondary | ICD-10-CM | POA: Diagnosis not present

## 2013-03-22 ENCOUNTER — Emergency Department (HOSPITAL_COMMUNITY): Payer: Medicare Other

## 2013-03-22 ENCOUNTER — Encounter (HOSPITAL_COMMUNITY): Payer: Self-pay | Admitting: Emergency Medicine

## 2013-03-22 ENCOUNTER — Emergency Department (HOSPITAL_COMMUNITY)
Admission: EM | Admit: 2013-03-22 | Discharge: 2013-03-22 | Disposition: A | Discharge status: Home / Self Care | Payer: Medicare Other | Attending: Emergency Medicine | Admitting: Emergency Medicine

## 2013-03-22 DIAGNOSIS — S8000XA Contusion of unspecified knee, initial encounter: Secondary | ICD-10-CM | POA: Insufficient documentation

## 2013-03-22 DIAGNOSIS — Z87891 Personal history of nicotine dependence: Secondary | ICD-10-CM | POA: Diagnosis not present

## 2013-03-22 DIAGNOSIS — W1809XA Striking against other object with subsequent fall, initial encounter: Secondary | ICD-10-CM | POA: Insufficient documentation

## 2013-03-22 DIAGNOSIS — IMO0002 Reserved for concepts with insufficient information to code with codable children: Secondary | ICD-10-CM | POA: Insufficient documentation

## 2013-03-22 DIAGNOSIS — Z7982 Long term (current) use of aspirin: Secondary | ICD-10-CM | POA: Insufficient documentation

## 2013-03-22 DIAGNOSIS — S8010XA Contusion of unspecified lower leg, initial encounter: Secondary | ICD-10-CM | POA: Diagnosis not present

## 2013-03-22 DIAGNOSIS — I10 Essential (primary) hypertension: Secondary | ICD-10-CM | POA: Insufficient documentation

## 2013-03-22 DIAGNOSIS — F411 Generalized anxiety disorder: Secondary | ICD-10-CM | POA: Diagnosis not present

## 2013-03-22 DIAGNOSIS — Y990 Civilian activity done for income or pay: Secondary | ICD-10-CM | POA: Insufficient documentation

## 2013-03-22 DIAGNOSIS — W010XXA Fall on same level from slipping, tripping and stumbling without subsequent striking against object, initial encounter: Secondary | ICD-10-CM | POA: Insufficient documentation

## 2013-03-22 DIAGNOSIS — S99929A Unspecified injury of unspecified foot, initial encounter: Secondary | ICD-10-CM | POA: Diagnosis not present

## 2013-03-22 DIAGNOSIS — S8002XA Contusion of left knee, initial encounter: Secondary | ICD-10-CM

## 2013-03-22 DIAGNOSIS — Z792 Long term (current) use of antibiotics: Secondary | ICD-10-CM | POA: Insufficient documentation

## 2013-03-22 DIAGNOSIS — Y9289 Other specified places as the place of occurrence of the external cause: Secondary | ICD-10-CM | POA: Insufficient documentation

## 2013-03-22 DIAGNOSIS — S8012XA Contusion of left lower leg, initial encounter: Secondary | ICD-10-CM

## 2013-03-22 DIAGNOSIS — Y93E9 Activity, other interior property and clothing maintenance: Secondary | ICD-10-CM | POA: Insufficient documentation

## 2013-03-22 DIAGNOSIS — Z79899 Other long term (current) drug therapy: Secondary | ICD-10-CM | POA: Insufficient documentation

## 2013-03-22 DIAGNOSIS — M25569 Pain in unspecified knee: Secondary | ICD-10-CM | POA: Diagnosis not present

## 2013-03-22 DIAGNOSIS — S8990XA Unspecified injury of unspecified lower leg, initial encounter: Secondary | ICD-10-CM | POA: Diagnosis not present

## 2013-03-22 DIAGNOSIS — S50311A Abrasion of right elbow, initial encounter: Secondary | ICD-10-CM

## 2013-03-22 DIAGNOSIS — S99919A Unspecified injury of unspecified ankle, initial encounter: Secondary | ICD-10-CM | POA: Diagnosis not present

## 2013-03-22 MED ORDER — BACITRACIN-NEOMYCIN-POLYMYXIN 400-5-5000 EX OINT
TOPICAL_OINTMENT | Freq: Once | CUTANEOUS | Status: AC
  Administered 2013-03-22: 1 via TOPICAL
  Filled 2013-03-22: qty 1

## 2013-03-22 MED ORDER — HYDROCODONE-ACETAMINOPHEN 5-325 MG PO TABS: 1.0000 | ORAL_TABLET | ORAL | Status: DC | PRN

## 2013-03-22 NOTE — ED Provider Notes (Signed)
CSN: [id number]     Arrival date & time 03/22/13  1430 History   First MD Initiated Contact with Patient 03/22/13 1507     Chief Complaint  Patient presents with  . Fall   (Consider location/radiation/quality/duration/timing/severity/associated sxs/prior Treatment) Patient is a 53 y.o. female presenting with fall. The history is provided by the patient.  Fall This is a new problem. The current episode started today (approximately 9:30 am). The problem has been unchanged.   Julia [name] is a 53 y.o. female who presents to the ED with painin the left leg and right arm s/p fall while at work today. Patient states that she was cleaning and slipped on a wet floor while pulling trash and fell on hard wood floor. Hit right elbow on something but not sure what causing a laceration and hit left knee on the floor. She denies head injury or LOC. Denies any other injuries. The injury occurred approximately 9:30 am. She left work and went home. She put an ice pack on the area. The area on the arm continues to bleed. She is up to date on tetanus. (within 5 years).   Past Medical History  Diagnosis Date  . Hypertension   . Anxiety    Past Surgical History  Procedure Laterality Date  . Tubal ligation     Family History  Problem Relation Age of Onset  . Stroke Mother   . Heart failure Mother   . Cancer Father     lung  . Diabetes Brother    History  Substance Use Topics  . Smoking status: Former Smoker    Types: Cigarettes  . Smokeless tobacco: Not on file  . Alcohol Use: No   OB History   Grav Para Term Preterm Abortions TAB SAB Ect Mult Living   3 3             Review of Systems Negative except as stated in HPI  Allergies  Naprosyn and Promethazine hcl  Home Medications   Current Outpatient Rx  Name  Route  Sig  Dispense  Refill  . ALPRAZolam (XANAX) 1 MG tablet   Oral   Take 1 mg by mouth 4 (four) times daily as needed for anxiety.          Marland Kitchen amitriptyline (ELAVIL) 25  MG tablet   Oral   Take 25 mg by mouth at bedtime.          Marland Kitchen aspirin EC 325 MG tablet   Oral   Take 325 mg by mouth daily.         . ferrous sulfate 325 (65 FE) MG tablet   Oral   Take 325 mg by mouth at bedtime.         . hydrochlorothiazide (MICROZIDE) 12.5 MG capsule   Oral   Take 12.5 mg by mouth daily.          Marland Kitchen HYDROcodone-acetaminophen (NORCO/VICODIN) 5-325 MG per tablet   Oral   Take 1-2 tablets by mouth every 6 (six) hours as needed for pain.   30 tablet   1   . Linaclotide (LINZESS) 145 MCG CAPS      1 PO 30 mins prior to your first meal   30 capsule   11   . metroNIDAZOLE (FLAGYL) 500 MG tablet   Oral   Take 1 tablet (500 mg total) by mouth 2 (two) times daily. PARTNER NEEDS TREATMENT ALSO.   14 tablet   1   . PARoxetine (  PAXIL) 20 MG tablet   Oral   Take 20 mg by mouth at bedtime.         . peg 3350 powder (MOVIPREP) 100 G SOLR   Oral   Take 1 kit (200 g total) by mouth as directed. PHARMACIST USE THE FOLLOWING FOR PATIENT DISCOUNT BIN: [id number] GROUP: [id number] ID: [id number] PATIENTS WILL SAVE UP TO $10 ON THEIR OUT-OF-POCKET EXPENSE FOR PROCESSING QUESTIONS, CALL [phone]   1 kit   0   . pravastatin (PRAVACHOL) 40 MG tablet   Oral   Take 40 mg by mouth daily.          BP 132/97  Pulse 90  Temp(Src) 98.2 F (36.8 C) (Oral)  Resp 18  Ht 5' (1.524 m)  Wt 156 lb (70.761 kg)  BMI 30.47 kg/m2  SpO2 94%  LMP 09/06/2012 Physical Exam  Nursing note and vitals reviewed. Constitutional: She is oriented to person, place, and time. She appears well-developed and well-nourished.  HENT:  Head: Normocephalic and atraumatic.  Eyes: Conjunctivae and EOM are normal.  Neck: Normal range of motion. Neck supple.  Cardiovascular: Normal rate.   Pulmonary/Chest: Effort normal.  Abdominal: Soft. There is no tenderness.  Musculoskeletal: Normal range of motion.       Right elbow: She exhibits laceration. She exhibits normal range of  motion, no swelling and no deformity. No tenderness found.       Left knee: She exhibits swelling and ecchymosis. She exhibits normal range of motion, no erythema and normal alignment. Tenderness found.       Arms:      Legs: Radial and pedal pulses strong, adequate circulation, good touch sensation.   Neurological: She is alert and oriented to person, place, and time. She has normal strength. No cranial nerve deficit or sensory deficit. Gait normal.  Skin: Skin is warm and dry.  Psychiatric: She has a normal mood and affect. Her behavior is normal.   Dg Knee Complete 4 Views Left  03/22/2013   CLINICAL DATA:  Pain and swelling.  Fall  EXAM: LEFT KNEE - COMPLETE 4+ VIEW  COMPARISON:  None.  FINDINGS: There is no evidence of fracture, dislocation, or joint effusion. There is no evidence of arthropathy or other focal bone abnormality. Soft tissues are unremarkable.  IMPRESSION: Negative.   Electronically Signed   By: Marlan Palau M.D.   On: 03/22/2013 15:45    ED Course  Procedures x-rays, ace wrap to left knee, wound care and bacitracin ointment and dressing to right elbow.   MDM  53 y.o. female with contusion to left knee and abrasion to right arm s/p fall. Stable for discharge. She is ambulatory without difficulty. Will treat for pain and abrasion. She is UTD on tetanus. I have reviewed this patient's vital signs, nurses notes, appropriate labs and imaging.  I discussed findings and plan of care with the patient and she voices understanding.    Medication List    TAKE these medications       HYDROcodone-acetaminophen 5-325 MG per tablet  Commonly known as:  NORCO/VICODIN  Take 1 tablet by mouth every 4 (four) hours as needed.      ASK your doctor about these medications       ALPRAZolam 1 MG tablet  Commonly known as:  XANAX  Take 1 mg by mouth 4 (four) times daily as needed for anxiety.     amitriptyline 25 MG tablet  Commonly known as:  ELAVIL  Take 25 mg by  mouth at bedtime.       aspirin EC 325 MG tablet  Take 325 mg by mouth daily.     ferrous sulfate 325 (65 FE) MG tablet  Take 325 mg by mouth at bedtime.     hydrochlorothiazide 12.5 MG capsule  Commonly known as:  MICROZIDE  Take 12.5 mg by mouth daily.     Linaclotide 145 MCG Caps capsule  Commonly known as:  LINZESS  1 PO 30 mins prior to your first meal     metroNIDAZOLE 500 MG tablet  Commonly known as:  FLAGYL  Take 1 tablet (500 mg total) by mouth 2 (two) times daily. PARTNER NEEDS TREATMENT ALSO.     PARoxetine 20 MG tablet  Commonly known as:  PAXIL  Take 20 mg by mouth at bedtime.     peg 3350 powder 100 G Solr  Commonly known as:  MOVIPREP  - Take 1 kit (200 g total) by mouth as directed. PHARMACIST USE THE FOLLOWING FOR PATIENT DISCOUNT  - BIN: [id number]  - GROUP: [id number]  - ID: [id number]  - PATIENTS WILL SAVE UP TO $10 ON THEIR OUT-OF-POCKET EXPENSE  - FOR PROCESSING QUESTIONS, CALL [phone]     pravastatin 40 MG tablet  Commonly known as:  PRAVACHOL  Take 40 mg by mouth daily.           [place], [place] 03/22/13 [phone]

## 2013-03-22 NOTE — Discharge Instructions (Signed)
Continue to take ibuprofen. Do not take the narcotic pain medication if you are driving as it will make you sleepy. Return as needed.  Contusion A contusion is a deep bruise. Contusions happen when an injury causes bleeding under the skin. Signs of bruising include pain, puffiness (swelling), and discolored skin. The contusion may turn blue, purple, or yellow. HOME CARE   Put ice on the injured area.  Put ice in a plastic bag.  Place a towel between your skin and the bag.  Leave the ice on for 15-20 minutes, 03-04 times a day.  Only take medicine as told by your doctor.  Rest the injured area.  If possible, raise (elevate) the injured area to lessen puffiness. GET HELP RIGHT AWAY IF:   You have more bruising or puffiness.  You have pain that is getting worse.  Your puffiness or pain is not helped by medicine. MAKE SURE YOU:   Understand these instructions.  Will watch your condition.  Will get help right away if you are not doing well or get worse. Document Released: 08/13/2007 Document Revised: 05/19/2011 Document Reviewed: 12/30/2010 [facility] Patient Information 2014 [place], [place].

## 2013-03-22 NOTE — ED Notes (Signed)
[place] today while at work, denies hitting head.  C/o pain to left leg and right arm.

## 2013-03-23 NOTE — ED Provider Notes (Signed)
Medical screening examination/treatment/procedure(s) were performed by non-physician practitioner and as supervising physician I was immediately available for consultation/collaboration.  EKG Interpretation   None       Rolland Porter, MD, Abram Sander    Janice Norrie, MD 03/23/13 Shelah Lewandowsky

## 2013-04-08 DIAGNOSIS — Z6829 Body mass index (BMI) 29.0-29.9, adult: Secondary | ICD-10-CM | POA: Diagnosis not present

## 2013-04-08 DIAGNOSIS — M7730 Calcaneal spur, unspecified foot: Secondary | ICD-10-CM | POA: Diagnosis not present

## 2013-04-08 DIAGNOSIS — D510 Vitamin B12 deficiency anemia due to intrinsic factor deficiency: Secondary | ICD-10-CM | POA: Diagnosis not present

## 2013-04-19 ENCOUNTER — Encounter: Payer: Self-pay | Admitting: Gastroenterology

## 2013-04-19 NOTE — Progress Notes (Signed)
CONTACT PT TO [facility] 

## 2013-04-20 NOTE — Progress Notes (Signed)
OPV PRIOR TO TCS. 

## 2013-04-20 NOTE — Progress Notes (Signed)
Dr. Oneida Alar Julia [name] has not been seen since July 2014, Ginger has sent her a letter to come back in for a follow up and she hasnt responded. Does she need an office visit before we schedule this?

## 2013-04-21 NOTE — Progress Notes (Signed)
Noted  

## 2013-04-21 NOTE — Progress Notes (Signed)
Forwarding to Manuela Schwartz to schedule OV appt.

## 2013-04-25 ENCOUNTER — Encounter: Payer: Self-pay | Admitting: Gastroenterology

## 2013-04-25 NOTE — Progress Notes (Signed)
Pt is aware of OV on 3/5 at 9 with SF and appt card was mailed

## 2013-05-12 ENCOUNTER — Other Ambulatory Visit (HOSPITAL_COMMUNITY): Payer: Self-pay | Admitting: Internal Medicine

## 2013-05-12 ENCOUNTER — Ambulatory Visit: Payer: Self-pay | Admitting: Gastroenterology

## 2013-05-12 DIAGNOSIS — I10 Essential (primary) hypertension: Secondary | ICD-10-CM | POA: Diagnosis not present

## 2013-05-12 DIAGNOSIS — Z79899 Other long term (current) drug therapy: Secondary | ICD-10-CM | POA: Diagnosis not present

## 2013-05-12 DIAGNOSIS — M545 Low back pain, unspecified: Secondary | ICD-10-CM | POA: Diagnosis not present

## 2013-05-12 DIAGNOSIS — Z1231 Encounter for screening mammogram for malignant neoplasm of breast: Secondary | ICD-10-CM

## 2013-05-12 DIAGNOSIS — G8929 Other chronic pain: Secondary | ICD-10-CM | POA: Diagnosis not present

## 2013-05-12 DIAGNOSIS — Z01419 Encounter for gynecological examination (general) (routine) without abnormal findings: Secondary | ICD-10-CM | POA: Diagnosis not present

## 2013-05-12 DIAGNOSIS — Z6829 Body mass index (BMI) 29.0-29.9, adult: Secondary | ICD-10-CM | POA: Diagnosis not present

## 2013-05-17 ENCOUNTER — Encounter: Payer: Self-pay | Admitting: *Deleted

## 2013-05-25 ENCOUNTER — Ambulatory Visit: Payer: Medicare Other | Admitting: Advanced Practice Midwife

## 2013-05-25 ENCOUNTER — Encounter (INDEPENDENT_AMBULATORY_CARE_PROVIDER_SITE_OTHER): Payer: Self-pay

## 2013-05-26 NOTE — Progress Notes (Signed)
Erroneous encounter

## 2013-06-01 ENCOUNTER — Ambulatory Visit: Payer: Self-pay | Admitting: Gastroenterology

## 2013-06-01 ENCOUNTER — Telehealth: Payer: Self-pay | Admitting: Gastroenterology

## 2013-06-01 NOTE — Telephone Encounter (Signed)
Pt was a no show

## 2013-06-01 NOTE — Telephone Encounter (Addendum)
NS x 2 OFC, PREOP: NSx1

## 2013-06-16 ENCOUNTER — Other Ambulatory Visit (HOSPITAL_COMMUNITY): Payer: Self-pay | Admitting: Internal Medicine

## 2013-06-16 ENCOUNTER — Ambulatory Visit (HOSPITAL_COMMUNITY)
Admission: RE | Admit: 2013-06-16 | Discharge: 2013-06-16 | Disposition: A | Discharge status: Home / Self Care | Payer: Medicare Other | Source: Ambulatory Visit | Attending: Internal Medicine | Admitting: Internal Medicine

## 2013-06-16 DIAGNOSIS — M79609 Pain in unspecified limb: Secondary | ICD-10-CM

## 2013-06-16 DIAGNOSIS — D510 Vitamin B12 deficiency anemia due to intrinsic factor deficiency: Secondary | ICD-10-CM | POA: Diagnosis not present

## 2013-06-16 DIAGNOSIS — M722 Plantar fascial fibromatosis: Secondary | ICD-10-CM | POA: Diagnosis not present

## 2013-06-16 DIAGNOSIS — Z6829 Body mass index (BMI) 29.0-29.9, adult: Secondary | ICD-10-CM | POA: Diagnosis not present

## 2013-06-30 DIAGNOSIS — S40019A Contusion of unspecified shoulder, initial encounter: Secondary | ICD-10-CM | POA: Diagnosis not present

## 2013-06-30 DIAGNOSIS — S1093XA Contusion of unspecified part of neck, initial encounter: Secondary | ICD-10-CM | POA: Diagnosis not present

## 2013-06-30 DIAGNOSIS — S0003XA Contusion of scalp, initial encounter: Secondary | ICD-10-CM | POA: Diagnosis not present

## 2013-06-30 DIAGNOSIS — S6990XA Unspecified injury of unspecified wrist, hand and finger(s), initial encounter: Secondary | ICD-10-CM | POA: Diagnosis not present

## 2013-07-08 ENCOUNTER — Ambulatory Visit (HOSPITAL_COMMUNITY): Payer: Self-pay

## 2013-07-11 ENCOUNTER — Ambulatory Visit (HOSPITAL_COMMUNITY): Payer: Self-pay

## 2013-07-14 ENCOUNTER — Ambulatory Visit (HOSPITAL_COMMUNITY): Payer: Self-pay

## 2013-07-24 ENCOUNTER — Emergency Department (HOSPITAL_COMMUNITY)
Admission: EM | Admit: 2013-07-24 | Discharge: 2013-07-24 | Disposition: A | Discharge status: Home / Self Care | Payer: Medicare Other | Attending: Emergency Medicine | Admitting: Emergency Medicine

## 2013-07-24 ENCOUNTER — Encounter (HOSPITAL_COMMUNITY): Payer: Self-pay | Admitting: Emergency Medicine

## 2013-07-24 DIAGNOSIS — I10 Essential (primary) hypertension: Secondary | ICD-10-CM | POA: Insufficient documentation

## 2013-07-24 DIAGNOSIS — G8929 Other chronic pain: Secondary | ICD-10-CM | POA: Insufficient documentation

## 2013-07-24 DIAGNOSIS — K047 Periapical abscess without sinus: Secondary | ICD-10-CM

## 2013-07-24 DIAGNOSIS — Z880 Allergy status to penicillin: Secondary | ICD-10-CM | POA: Insufficient documentation

## 2013-07-24 DIAGNOSIS — K0500 Acute gingivitis, plaque induced: Secondary | ICD-10-CM

## 2013-07-24 DIAGNOSIS — Z79899 Other long term (current) drug therapy: Secondary | ICD-10-CM | POA: Diagnosis not present

## 2013-07-24 DIAGNOSIS — F411 Generalized anxiety disorder: Secondary | ICD-10-CM | POA: Insufficient documentation

## 2013-07-24 DIAGNOSIS — R599 Enlarged lymph nodes, unspecified: Secondary | ICD-10-CM | POA: Insufficient documentation

## 2013-07-24 DIAGNOSIS — Z7982 Long term (current) use of aspirin: Secondary | ICD-10-CM | POA: Diagnosis not present

## 2013-07-24 DIAGNOSIS — Z792 Long term (current) use of antibiotics: Secondary | ICD-10-CM | POA: Diagnosis not present

## 2013-07-24 DIAGNOSIS — Z87891 Personal history of nicotine dependence: Secondary | ICD-10-CM | POA: Diagnosis not present

## 2013-07-24 MED ORDER — CLINDAMYCIN HCL 150 MG PO CAPS
300.0000 mg | ORAL_CAPSULE | Freq: Once | ORAL | Status: AC
  Administered 2013-07-24: 300 mg via ORAL
  Filled 2013-07-24: qty 2

## 2013-07-24 MED ORDER — CLINDAMYCIN HCL 150 MG PO CAPS: 300.0000 mg | ORAL_CAPSULE | Freq: Four times a day (QID) | ORAL | Status: DC

## 2013-07-24 NOTE — Discharge Instructions (Signed)
Abscessed Tooth An abscessed tooth is an infection around your tooth. It may be caused by holes or damage to the tooth (cavity) or a dental disease. An abscessed tooth causes mild to very bad pain in and around the tooth. See your dentist right away if you have tooth or gum pain. HOME CARE  Take your medicine as told. Finish it even if you start to feel better.  Do not drive after taking pain medicine.  Rinse your mouth (gargle) often with salt water ( teaspoon salt in 8 ounces of warm water).  Do not apply heat to the outside of your face. GET HELP RIGHT AWAY IF:   You have a temperature by mouth above 102 F (38.9 C), not controlled by medicine.  You have chills and a very bad headache.  You have problems breathing or swallowing.  Your mouth will not open.  You develop puffiness (swelling) on the neck or around the eye.  Your pain is not helped by medicine.  Your pain is getting worse instead of better. MAKE SURE YOU:   Understand these instructions.  Will watch your condition.  Will get help right away if you are not doing well or get worse. Document Released: 08/13/2007 Document Revised: 05/19/2011 Document Reviewed: 06/04/2010 [facility] Patient Information 2014 [place].  Gingivitis  Gingivitis is an infection of the teeth and bones that support the teeth. Your gums become red, sore, and puffy (swollen). It is caused by germs that build up on your teeth and gums (plaque). HOME CARE  Floss and then brush your teeth.  Brush at least twice a day.  Floss at least once a day.  Avoid sugar between meals.  Do not drink juice before bed. Only drink water.  Make and keep your regular checkups and cleanings with your dentist.  Use any mouth care product or toothpaste as told by your dentist. GET HELP RIGHT AWAY IF:  You have painful, red tissue around your teeth.  You have trouble chewing.  You have loose or infected teeth. MAKE SURE YOU:  Understand  these instructions.  Will watch your condition.  Will get help right away if you are not doing well or get worse. Document Released: 03/29/2010 Document Revised: 05/19/2011 Document Reviewed: 03/29/2010 [facility] Patient Information 2014 [place], [place].

## 2013-07-24 NOTE — ED Provider Notes (Signed)
CSN: [id number]     Arrival date & time 07/24/13  1813 History  This chart was scribed for [facility], NP, working with Carmin Muskrat, MD, by Elby Beck ED Scribe. This patient was seen in room APFT24/APFT24 and the patient's care was started at 6:31 PM.   Chief Complaint  Patient presents with  . Facial Swelling     Patient is a 53 y.o. female presenting with tooth pain. The history is provided by the patient. No language interpreter was used.  Dental Pain Location:  Lower Lower teeth location:  27/RL cuspid Quality:  Throbbing Severity:  Severe Onset quality:  Gradual Duration:  3 days Timing:  Constant Progression:  Worsening Chronicity:  New Context: abscess, dental caries and poor dentition   Worsened by:  Cold food/drink, pressure and jaw movement Associated symptoms: facial swelling   Associated symptoms: no fever and no headaches     HPI Comments: Julia [name] is a 53 y.o. female who presents to the Emergency Department complaining of gradually worsening, constant, moderate right lower dental pain with associated facial swelling onset 3 days ago. She states that she has applied Orajel, as well as taken Advil and Oxycodone 5 mg- all with minimal relief. She denies chest pain, fever, chills, headache, abdominal pain, urinary symptoms, neck stiffness, visual disturbances or any other pain or symptoms. She states that she has allergies to Penicillin and Naprosyn.   Past Medical History  Diagnosis Date  . Hypertension   . Anxiety   . Chronic pain   . XVQMGQQP(619.5)    Past Surgical History  Procedure Laterality Date  . Tubal ligation     Family History  Problem Relation Age of Onset  . Stroke Mother   . Heart failure Mother   . Cancer Father     lung  . Diabetes Brother    History  Substance Use Topics  . Smoking status: Former Smoker    Types: Cigarettes  . Smokeless tobacco: Not on file  . Alcohol Use: No   OB History   Grav Para Term Preterm  Abortions TAB SAB Ect Mult Living   3 3             Review of Systems  Constitutional: Negative for fever and chills.  HENT: Positive for dental problem and facial swelling.   Eyes: Negative for visual disturbance.  Cardiovascular: Negative for chest pain.  Gastrointestinal: Negative for abdominal pain.  Genitourinary: Negative for dysuria, frequency and hematuria.  Musculoskeletal: Negative for neck stiffness.  Neurological: Negative for headaches.    Allergies  Naprosyn and Promethazine hcl  Home Medications   Prior to Admission medications   Medication Sig Start Date End Date Taking? Authorizing Provider  ALPRAZolam Duanne Moron) 1 MG tablet Take 1 mg by mouth 4 (four) times daily as needed for anxiety.  05/17/12   Historical Provider, MD  amitriptyline (ELAVIL) 25 MG tablet Take 25 mg by mouth at bedtime.  05/22/12   Historical Provider, MD  aspirin EC 325 MG tablet Take 325 mg by mouth daily.    Historical Provider, MD  ferrous sulfate 325 (65 FE) MG tablet Take 325 mg by mouth at bedtime.    Historical Provider, MD  hydrochlorothiazide (MICROZIDE) 12.5 MG capsule Take 12.5 mg by mouth daily.  05/15/12   Historical Provider, MD  HYDROcodone-acetaminophen (NORCO/VICODIN) 5-325 MG per tablet Take 1 tablet by mouth every 4 (four) hours as needed. 03/22/13   Hatch, NP  Linaclotide [facility]) 145 MCG  CAPS 1 PO 30 mins prior to your first meal 09/30/12   Danie Binder, MD  metroNIDAZOLE (FLAGYL) 500 MG tablet Take 1 tablet (500 mg total) by mouth 2 (two) times daily. PARTNER NEEDS TREATMENT ALSO. 08/11/12   Jonnie Kind, MD  PARoxetine (PAXIL) 20 MG tablet Take 20 mg by mouth at bedtime. 05/22/12   Historical Provider, MD  peg 3350 powder (MOVIPREP) 100 G SOLR Take 1 kit (200 g total) by mouth as directed. PHARMACIST USE THE FOLLOWING FOR PATIENT DISCOUNT BIN: [id number] GROUP: [id number] ID: [id number] PATIENTS WILL SAVE UP TO $10 ON THEIR OUT-OF-POCKET EXPENSE FOR PROCESSING QUESTIONS,  CALL [phone] 09/30/12   Danie Binder, MD  pravastatin (PRAVACHOL) 40 MG tablet Take 40 mg by mouth daily. 05/20/12   Historical Provider, MD   Triage Vitals: BP 147/106  Pulse 114  Temp(Src) 98.1 F (36.7 C) (Oral)  Resp 20  Ht 5' (1.524 m)  Wt 144 lb 3.2 oz (65.409 kg)  BMI 28.16 kg/m2  SpO2 96%  LMP 09/06/2012  Physical Exam  Nursing note and vitals reviewed. Constitutional: She is oriented to person, place, and time. She appears well-developed and well-nourished. No distress.  HENT:  Head: Normocephalic and atraumatic.  Right Ear: Tympanic membrane normal.  Left Ear: Tympanic membrane normal.  Mouth/Throat:    Abscess to the gumline with surrounding gingivitis and erythema. Able to extend jaw without difficulty. Uvula midline.   Eyes: Conjunctivae and EOM are normal. Pupils are equal, round, and reactive to light.  Neck: Neck supple. No tracheal deviation present.  Anterior cervical lymphadenopathy on the right.  Cardiovascular: Normal rate and regular rhythm.   Pulmonary/Chest: Effort normal and breath sounds normal. No respiratory distress. She has no wheezes.  Abdominal: Soft. Bowel sounds are normal. There is no tenderness.  Musculoskeletal: Normal range of motion.  Lymphadenopathy:    She has cervical adenopathy.  Neurological: She is alert and oriented to person, place, and time.  Skin: Skin is warm and dry.  Psychiatric: She has a normal mood and affect. Her behavior is normal.    ED Course  Procedures (including critical care time)  DIAGNOSTIC STUDIES: Oxygen Saturation is 96% on RA, normal by my interpretation.    COORDINATION OF CARE: 6:37 PM- Discussed plan to start pt on clindamycin in the ED. Pt advised of plan for treatment and pt agrees.  `` MDM  53 y.o. female with dental pain and gum swelling and pain x 3 days. Will treat for infection and she will take the pain medication that she has at home. Information on dental clinic given. Stable for  discharge without fever or signs of sepsis. Discussed with the patient and all questioned fully answered. She will return if any problems arise.    Medication List    TAKE these medications       clindamycin 150 MG capsule  Commonly known as:  CLEOCIN  Take 2 capsules (300 mg total) by mouth 4 (four) times daily.      ASK your doctor about these medications       ALPRAZolam 1 MG tablet  Commonly known as:  XANAX  Take 1 mg by mouth 4 (four) times daily as needed for anxiety.     amitriptyline 25 MG tablet  Commonly known as:  ELAVIL  Take 25 mg by mouth at bedtime.     aspirin EC 325 MG tablet  Take 325 mg by mouth daily.     ferrous sulfate 325 (65  FE) MG tablet  Take 325 mg by mouth at bedtime.     hydrochlorothiazide 12.5 MG capsule  Commonly known as:  MICROZIDE  Take 12.5 mg by mouth daily.     HYDROcodone-acetaminophen 5-325 MG per tablet  Commonly known as:  NORCO/VICODIN  Take 1 tablet by mouth every 4 (four) hours as needed.     Linaclotide 145 MCG Caps capsule  Commonly known as:  LINZESS  1 PO 30 mins prior to your first meal     metroNIDAZOLE 500 MG tablet  Commonly known as:  FLAGYL  Take 1 tablet (500 mg total) by mouth 2 (two) times daily. PARTNER NEEDS TREATMENT ALSO.     PARoxetine 20 MG tablet  Commonly known as:  PAXIL  Take 20 mg by mouth at bedtime.     peg 3350 powder 100 G Solr  Commonly known as:  MOVIPREP  - Take 1 kit (200 g total) by mouth as directed. PHARMACIST USE THE FOLLOWING FOR PATIENT DISCOUNT  - BIN: [id number]  - GROUP: [id number]  - ID: [id number]  - PATIENTS WILL SAVE UP TO $10 ON THEIR OUT-OF-POCKET EXPENSE  - FOR PROCESSING QUESTIONS, CALL [phone]     pravastatin 40 MG tablet  Commonly known as:  PRAVACHOL  Take 40 mg by mouth daily.        Final diagnoses:  Dental abscess  Gingivitis, acute  I personally performed the services described in this documentation, which was scribed in my presence. The  recorded information has been reviewed and is accurate.    [facility] Bunnie Pion, [place] 07/24/13 [phone]

## 2013-07-24 NOTE — ED Provider Notes (Signed)
  Medical screening examination/treatment/procedure(s) were performed by non-physician practitioner and as supervising physician I was immediately available for consultation/collaboration.   EKG Interpretation None         Carmin Muskrat, MD 07/24/13 [phone]

## 2013-07-24 NOTE — ED Notes (Signed)
Pt c/o right lower dental pain and facial swelling that started Thursday evening, denies any problems swallowing,

## 2013-07-26 DIAGNOSIS — Z6828 Body mass index (BMI) 28.0-28.9, adult: Secondary | ICD-10-CM | POA: Diagnosis not present

## 2013-07-26 DIAGNOSIS — R229 Localized swelling, mass and lump, unspecified: Secondary | ICD-10-CM | POA: Diagnosis not present

## 2013-07-26 DIAGNOSIS — Z79899 Other long term (current) drug therapy: Secondary | ICD-10-CM | POA: Diagnosis not present

## 2013-07-26 DIAGNOSIS — R51 Headache: Secondary | ICD-10-CM | POA: Diagnosis not present

## 2013-07-26 DIAGNOSIS — M549 Dorsalgia, unspecified: Secondary | ICD-10-CM | POA: Diagnosis not present

## 2013-07-28 DIAGNOSIS — M79609 Pain in unspecified limb: Secondary | ICD-10-CM | POA: Diagnosis not present

## 2013-07-28 DIAGNOSIS — M722 Plantar fascial fibromatosis: Secondary | ICD-10-CM | POA: Diagnosis not present

## 2013-08-04 ENCOUNTER — Ambulatory Visit (HOSPITAL_COMMUNITY)
Admission: RE | Admit: 2013-08-04 | Discharge: 2013-08-04 | Disposition: A | Discharge status: Home / Self Care | Payer: Medicare Other | Source: Ambulatory Visit | Attending: Internal Medicine | Admitting: Internal Medicine

## 2013-08-04 ENCOUNTER — Ambulatory Visit (HOSPITAL_COMMUNITY): Payer: Self-pay

## 2013-08-04 DIAGNOSIS — Z1231 Encounter for screening mammogram for malignant neoplasm of breast: Secondary | ICD-10-CM

## 2013-08-09 ENCOUNTER — Other Ambulatory Visit: Payer: Medicare Other | Admitting: Adult Health

## 2013-08-26 DIAGNOSIS — M722 Plantar fascial fibromatosis: Secondary | ICD-10-CM | POA: Diagnosis not present

## 2013-08-26 DIAGNOSIS — M7660 Achilles tendinitis, unspecified leg: Secondary | ICD-10-CM | POA: Diagnosis not present

## 2013-08-29 DIAGNOSIS — G8929 Other chronic pain: Secondary | ICD-10-CM | POA: Diagnosis not present

## 2013-08-29 DIAGNOSIS — Z79899 Other long term (current) drug therapy: Secondary | ICD-10-CM | POA: Diagnosis not present

## 2013-08-29 DIAGNOSIS — Z6828 Body mass index (BMI) 28.0-28.9, adult: Secondary | ICD-10-CM | POA: Diagnosis not present

## 2013-08-29 DIAGNOSIS — D510 Vitamin B12 deficiency anemia due to intrinsic factor deficiency: Secondary | ICD-10-CM | POA: Diagnosis not present

## 2013-09-08 ENCOUNTER — Other Ambulatory Visit: Payer: Medicare Other | Admitting: Adult Health

## 2013-09-16 DIAGNOSIS — M7660 Achilles tendinitis, unspecified leg: Secondary | ICD-10-CM | POA: Diagnosis not present

## 2013-09-16 DIAGNOSIS — M722 Plantar fascial fibromatosis: Secondary | ICD-10-CM | POA: Diagnosis not present

## 2013-09-20 ENCOUNTER — Ambulatory Visit (INDEPENDENT_AMBULATORY_CARE_PROVIDER_SITE_OTHER): Payer: Medicare Other | Admitting: Adult Health

## 2013-09-20 ENCOUNTER — Encounter: Payer: Self-pay | Admitting: Adult Health

## 2013-09-20 VITALS — BP 110/84 | HR 74 | Ht 59.0 in | Wt 147.0 lb

## 2013-09-20 DIAGNOSIS — N816 Rectocele: Secondary | ICD-10-CM

## 2013-09-20 DIAGNOSIS — Z01419 Encounter for gynecological examination (general) (routine) without abnormal findings: Secondary | ICD-10-CM | POA: Diagnosis not present

## 2013-09-20 DIAGNOSIS — Z1212 Encounter for screening for malignant neoplasm of rectum: Secondary | ICD-10-CM

## 2013-09-20 DIAGNOSIS — Z139 Encounter for screening, unspecified: Secondary | ICD-10-CM

## 2013-09-20 DIAGNOSIS — K5904 Chronic idiopathic constipation: Secondary | ICD-10-CM

## 2013-09-20 LAB — HEMOCCULT GUIAC POC 1CARD (OFFICE): FECAL OCCULT BLD: NEGATIVE

## 2013-09-20 NOTE — Patient Instructions (Signed)
Physical in 1 year mammogram yearly Labs at PCP Referred to Dr Oneida Alar for colonoscopy

## 2013-09-20 NOTE — Progress Notes (Signed)
Patient ID: Julia [name], female   DOB: 28-Jun-1960, 53 y.o.   MRN: [id number] History of Present Illness: Julia [name] is a 53 year old white female, widowed, in for a physical.Had normal pap with negative HPV 08/05/12.Has some constipation on linzess and it is better.But explained her pain meds could contribute to the constipation.Pt says she has not had colonoscopy.   Current Medications, Allergies, Past Medical History, Past Surgical History, Family History and Social History were reviewed in [employer] record.     Review of Systems: Patient denies any headaches, blurred vision, shortness of breath, chest pain, abdominal pain, problems with urination, or intercourse. Not having sex, no joint swelling or mood swings, see HPI for positives.    Physical Exam:BP 110/84  Pulse 74  Ht 4\' 11"  (1.499 m)  Wt 147 lb (66.679 kg)  BMI 29.67 kg/m2  LMP 09/06/2012 General:  Well developed, well nourished, no acute distress Skin:  Warm and dry, increased number of moles and skin tags, she says her Mom and grandma has too Neck:  Midline trachea, normal thyroid Lungs; Clear to auscultation bilaterally Breast:  No dominant palpable mass, retraction, or nipple discharge Cardiovascular: Regular rate and rhythm Abdomen:  Soft, non tender, no hepatosplenomegaly Pelvic:  External genitalia is normal in appearance.  The vagina is normal in appearance.  The cervix is smooth.  Uterus is felt to be normal size, shape, and contour.  No adnexal masses or tenderness noted. Rectal: Good sphincter tone, no polyps, or hemorrhoids felt.  Hemoccult negative.+rectocele Extremities:  No swelling or varicosities noted Psych:  No mood changes, alert and cooperative,seems happy Discussed when stops period over over a year and has bleeding needs to let us know would need [place].  Impression: Yearly gyn exam Rectocele Constipation    Plan: Physical in 1 year Mammogram yearly  Labs with  PCP Referred to Dr Oneida Alar for colonoscopy

## 2013-10-17 DIAGNOSIS — G8929 Other chronic pain: Secondary | ICD-10-CM | POA: Diagnosis not present

## 2013-10-17 DIAGNOSIS — M7730 Calcaneal spur, unspecified foot: Secondary | ICD-10-CM | POA: Diagnosis not present

## 2013-10-17 DIAGNOSIS — Z6828 Body mass index (BMI) 28.0-28.9, adult: Secondary | ICD-10-CM | POA: Diagnosis not present

## 2013-10-17 DIAGNOSIS — D510 Vitamin B12 deficiency anemia due to intrinsic factor deficiency: Secondary | ICD-10-CM | POA: Diagnosis not present

## 2013-12-20 ENCOUNTER — Other Ambulatory Visit (HOSPITAL_COMMUNITY): Payer: Self-pay | Admitting: Internal Medicine

## 2013-12-20 DIAGNOSIS — R519 Headache, unspecified: Secondary | ICD-10-CM

## 2013-12-20 DIAGNOSIS — Z6828 Body mass index (BMI) 28.0-28.9, adult: Secondary | ICD-10-CM | POA: Diagnosis not present

## 2013-12-20 DIAGNOSIS — Z79899 Other long term (current) drug therapy: Secondary | ICD-10-CM | POA: Diagnosis not present

## 2013-12-20 DIAGNOSIS — Q8500 Neurofibromatosis, unspecified: Secondary | ICD-10-CM

## 2013-12-20 DIAGNOSIS — D510 Vitamin B12 deficiency anemia due to intrinsic factor deficiency: Secondary | ICD-10-CM | POA: Diagnosis not present

## 2013-12-20 DIAGNOSIS — R51 Headache: Secondary | ICD-10-CM | POA: Diagnosis not present

## 2013-12-20 DIAGNOSIS — E781 Pure hyperglyceridemia: Secondary | ICD-10-CM | POA: Diagnosis not present

## 2013-12-20 DIAGNOSIS — E538 Deficiency of other specified B group vitamins: Secondary | ICD-10-CM | POA: Diagnosis not present

## 2013-12-23 ENCOUNTER — Ambulatory Visit (HOSPITAL_COMMUNITY)
Admission: RE | Admit: 2013-12-23 | Discharge: 2013-12-23 | Disposition: A | Discharge status: Home / Self Care | Payer: Medicare Other | Source: Ambulatory Visit | Attending: Internal Medicine | Admitting: Internal Medicine

## 2013-12-23 DIAGNOSIS — G939 Disorder of brain, unspecified: Secondary | ICD-10-CM | POA: Diagnosis not present

## 2013-12-23 DIAGNOSIS — R519 Headache, unspecified: Secondary | ICD-10-CM

## 2013-12-23 DIAGNOSIS — R51 Headache: Secondary | ICD-10-CM | POA: Diagnosis not present

## 2013-12-23 DIAGNOSIS — Q8500 Neurofibromatosis, unspecified: Secondary | ICD-10-CM | POA: Diagnosis not present

## 2014-01-04 DIAGNOSIS — Z6828 Body mass index (BMI) 28.0-28.9, adult: Secondary | ICD-10-CM | POA: Diagnosis not present

## 2014-01-04 DIAGNOSIS — K529 Noninfective gastroenteritis and colitis, unspecified: Secondary | ICD-10-CM | POA: Diagnosis not present

## 2014-01-09 ENCOUNTER — Encounter: Payer: Self-pay | Admitting: Adult Health

## 2014-04-06 DIAGNOSIS — G894 Chronic pain syndrome: Secondary | ICD-10-CM | POA: Diagnosis not present

## 2014-04-06 DIAGNOSIS — F419 Anxiety disorder, unspecified: Secondary | ICD-10-CM | POA: Diagnosis not present

## 2014-04-06 DIAGNOSIS — R3919 Other difficulties with micturition: Secondary | ICD-10-CM | POA: Diagnosis not present

## 2014-04-06 DIAGNOSIS — Z6828 Body mass index (BMI) 28.0-28.9, adult: Secondary | ICD-10-CM | POA: Diagnosis not present

## 2014-05-31 DIAGNOSIS — M722 Plantar fascial fibromatosis: Secondary | ICD-10-CM | POA: Diagnosis not present

## 2014-05-31 DIAGNOSIS — M2142 Flat foot [pes planus] (acquired), left foot: Secondary | ICD-10-CM | POA: Diagnosis not present

## 2014-07-11 ENCOUNTER — Other Ambulatory Visit (HOSPITAL_COMMUNITY): Payer: Self-pay | Admitting: Internal Medicine

## 2014-07-11 DIAGNOSIS — Z1231 Encounter for screening mammogram for malignant neoplasm of breast: Secondary | ICD-10-CM

## 2014-07-20 DIAGNOSIS — C44319 Basal cell carcinoma of skin of other parts of face: Secondary | ICD-10-CM | POA: Diagnosis not present

## 2014-07-20 DIAGNOSIS — D1801 Hemangioma of skin and subcutaneous tissue: Secondary | ICD-10-CM | POA: Diagnosis not present

## 2014-07-20 DIAGNOSIS — C44112 Basal cell carcinoma of skin of right eyelid, including canthus: Secondary | ICD-10-CM | POA: Diagnosis not present

## 2014-07-20 DIAGNOSIS — L820 Inflamed seborrheic keratosis: Secondary | ICD-10-CM | POA: Diagnosis not present

## 2014-07-20 DIAGNOSIS — D225 Melanocytic nevi of trunk: Secondary | ICD-10-CM | POA: Diagnosis not present

## 2014-07-20 DIAGNOSIS — L821 Other seborrheic keratosis: Secondary | ICD-10-CM | POA: Diagnosis not present

## 2014-07-27 DIAGNOSIS — E663 Overweight: Secondary | ICD-10-CM | POA: Diagnosis not present

## 2014-07-27 DIAGNOSIS — J019 Acute sinusitis, unspecified: Secondary | ICD-10-CM | POA: Diagnosis not present

## 2014-07-27 DIAGNOSIS — Z6829 Body mass index (BMI) 29.0-29.9, adult: Secondary | ICD-10-CM | POA: Diagnosis not present

## 2014-07-27 DIAGNOSIS — F329 Major depressive disorder, single episode, unspecified: Secondary | ICD-10-CM | POA: Diagnosis not present

## 2014-07-27 DIAGNOSIS — F419 Anxiety disorder, unspecified: Secondary | ICD-10-CM | POA: Diagnosis not present

## 2014-07-27 DIAGNOSIS — E782 Mixed hyperlipidemia: Secondary | ICD-10-CM | POA: Diagnosis not present

## 2014-07-27 DIAGNOSIS — E559 Vitamin D deficiency, unspecified: Secondary | ICD-10-CM | POA: Diagnosis not present

## 2014-08-09 ENCOUNTER — Ambulatory Visit (HOSPITAL_COMMUNITY): Payer: Self-pay

## 2014-08-14 ENCOUNTER — Ambulatory Visit (HOSPITAL_COMMUNITY): Payer: Self-pay

## 2014-08-17 ENCOUNTER — Encounter (HOSPITAL_COMMUNITY): Payer: Self-pay

## 2014-09-12 DIAGNOSIS — T148 Other injury of unspecified body region: Secondary | ICD-10-CM | POA: Diagnosis not present

## 2014-09-12 DIAGNOSIS — S81011A Laceration without foreign body, right knee, initial encounter: Secondary | ICD-10-CM | POA: Diagnosis not present

## 2014-09-13 ENCOUNTER — Ambulatory Visit (HOSPITAL_COMMUNITY)
Admission: RE | Admit: 2014-09-13 | Discharge: 2014-09-13 | Disposition: A | Discharge status: Home / Self Care | Payer: Medicare Other | Source: Ambulatory Visit | Attending: Internal Medicine | Admitting: Internal Medicine

## 2014-09-13 DIAGNOSIS — Z1231 Encounter for screening mammogram for malignant neoplasm of breast: Secondary | ICD-10-CM | POA: Diagnosis not present

## 2014-09-22 ENCOUNTER — Other Ambulatory Visit: Payer: Medicare Other | Admitting: Adult Health

## 2014-09-26 ENCOUNTER — Encounter: Payer: Self-pay | Admitting: Adult Health

## 2014-09-26 ENCOUNTER — Other Ambulatory Visit: Payer: Medicare Other | Admitting: Adult Health

## 2014-09-29 ENCOUNTER — Ambulatory Visit (HOSPITAL_COMMUNITY)
Admission: RE | Admit: 2014-09-29 | Discharge: 2014-09-29 | Disposition: A | Discharge status: Home / Self Care | Payer: Medicare Other | Source: Ambulatory Visit | Attending: Family Medicine | Admitting: Family Medicine

## 2014-09-29 ENCOUNTER — Other Ambulatory Visit (HOSPITAL_COMMUNITY): Payer: Self-pay | Admitting: Family Medicine

## 2014-09-29 DIAGNOSIS — G894 Chronic pain syndrome: Secondary | ICD-10-CM | POA: Diagnosis not present

## 2014-09-29 DIAGNOSIS — M545 Low back pain: Secondary | ICD-10-CM | POA: Diagnosis not present

## 2014-09-29 DIAGNOSIS — D510 Vitamin B12 deficiency anemia due to intrinsic factor deficiency: Secondary | ICD-10-CM | POA: Diagnosis not present

## 2014-09-29 DIAGNOSIS — E663 Overweight: Secondary | ICD-10-CM | POA: Diagnosis not present

## 2014-09-29 DIAGNOSIS — M47816 Spondylosis without myelopathy or radiculopathy, lumbar region: Secondary | ICD-10-CM | POA: Diagnosis not present

## 2014-09-29 DIAGNOSIS — Z1389 Encounter for screening for other disorder: Secondary | ICD-10-CM | POA: Diagnosis not present

## 2014-09-29 DIAGNOSIS — Z6829 Body mass index (BMI) 29.0-29.9, adult: Secondary | ICD-10-CM | POA: Diagnosis not present

## 2014-10-04 ENCOUNTER — Other Ambulatory Visit: Payer: Medicare Other | Admitting: Adult Health

## 2014-10-16 ENCOUNTER — Encounter: Payer: Self-pay | Admitting: Adult Health

## 2014-10-16 ENCOUNTER — Ambulatory Visit (INDEPENDENT_AMBULATORY_CARE_PROVIDER_SITE_OTHER): Payer: Medicare Other | Admitting: Adult Health

## 2014-10-16 VITALS — BP 120/80 | HR 84 | Ht 59.0 in | Wt 150.5 lb

## 2014-10-16 DIAGNOSIS — Z1212 Encounter for screening for malignant neoplasm of rectum: Secondary | ICD-10-CM

## 2014-10-16 DIAGNOSIS — Z01419 Encounter for gynecological examination (general) (routine) without abnormal findings: Secondary | ICD-10-CM | POA: Diagnosis not present

## 2014-10-16 DIAGNOSIS — Z0000 Encounter for general adult medical examination without abnormal findings: Secondary | ICD-10-CM | POA: Diagnosis not present

## 2014-10-16 LAB — HEMOCCULT GUIAC POC 1CARD (OFFICE): FECAL OCCULT BLD: NEGATIVE

## 2014-10-16 NOTE — Patient Instructions (Signed)
Pap and physical in 1 year Mammogram yearly Labs with with PCP Colonoscopy advised

## 2014-10-16 NOTE — Progress Notes (Signed)
Patient ID: Julia [name], female   DOB: Jul 05, 1960, 54 y.o.   MRN: [id number] History of Present Illness: Julia [name] is a 54 year old white female,in for well woman gyn exam.She had a normal pap with negative HPV 08/05/12. PCP is Dr Gerarda Fraction.  Current Medications, Allergies, Past Medical History, Past Surgical History, Family History and Social History were reviewed in [facility] record.     Review of Systems: Patient denies any daily headaches, hearing loss, fatigue, blurred vision, shortness of breath, chest pain, abdominal pain, problems with urination, or intercourse(not having sex). No joint pain or mood swings, takes meds, does have constipation but is on linzess, told to take daily on empty stomach.    Physical Exam:BP 120/80 mmHg  Pulse 84  Ht 4\' 11"  (1.499 m)  Wt 150 lb 8 oz (68.266 kg)  BMI 30.38 kg/m2  LMP 09/06/2012 General:  Well developed, well nourished, no acute distress Skin:  Warm and dry, has increased number of skin tags Neck:  Midline trachea, normal thyroid, good ROM, no lymphadenopathy Lungs; Clear to auscultation bilaterally Breast:  No dominant palpable mass, retraction, or nipple discharge Cardiovascular: Regular rate and rhythm Abdomen:  Soft, non tender, no hepatosplenomegaly Pelvic:  External genitalia is normal in appearance, no lesions.  The vagina is normal in appearance. Urethra has no lesions or masses. The cervix is bulbous.  Uterus is felt to be normal size, shape, and contour.  No adnexal masses or tenderness noted.Bladder is non tender, no masses felt. Rectal: Good sphincter tone, no polyps, internal hemorrhoids felt.  Hemoccult negative.+ low rectocele Extremities/musculoskeletal:  No swelling or varicosities noted, no clubbing or cyanosis Psych:  No mood changes, alert and cooperative,seems happy   Impression: Well woman gyn exam no pap    Plan: Pap and physical in 1 year Mammogram yearly Labs with PCP Colonoscopy  advised

## 2014-10-17 DIAGNOSIS — E663 Overweight: Secondary | ICD-10-CM | POA: Diagnosis not present

## 2014-10-17 DIAGNOSIS — Z1389 Encounter for screening for other disorder: Secondary | ICD-10-CM | POA: Diagnosis not present

## 2014-10-17 DIAGNOSIS — G894 Chronic pain syndrome: Secondary | ICD-10-CM | POA: Diagnosis not present

## 2014-10-17 DIAGNOSIS — E538 Deficiency of other specified B group vitamins: Secondary | ICD-10-CM | POA: Diagnosis not present

## 2014-10-17 DIAGNOSIS — E559 Vitamin D deficiency, unspecified: Secondary | ICD-10-CM | POA: Diagnosis not present

## 2014-10-17 DIAGNOSIS — Z6829 Body mass index (BMI) 29.0-29.9, adult: Secondary | ICD-10-CM | POA: Diagnosis not present

## 2014-11-07 ENCOUNTER — Encounter (INDEPENDENT_AMBULATORY_CARE_PROVIDER_SITE_OTHER): Payer: Self-pay | Admitting: *Deleted

## 2014-11-22 ENCOUNTER — Ambulatory Visit (HOSPITAL_COMMUNITY)
Admission: RE | Admit: 2014-11-22 | Discharge: 2014-11-22 | Disposition: A | Discharge status: Home / Self Care | Payer: Medicare Other | Source: Ambulatory Visit | Attending: Physician Assistant | Admitting: Physician Assistant

## 2014-11-22 ENCOUNTER — Other Ambulatory Visit (HOSPITAL_COMMUNITY): Payer: Self-pay | Admitting: Physician Assistant

## 2014-11-22 DIAGNOSIS — R0602 Shortness of breath: Secondary | ICD-10-CM | POA: Insufficient documentation

## 2014-11-22 DIAGNOSIS — E663 Overweight: Secondary | ICD-10-CM | POA: Diagnosis not present

## 2014-11-22 DIAGNOSIS — Z6829 Body mass index (BMI) 29.0-29.9, adult: Secondary | ICD-10-CM | POA: Diagnosis not present

## 2014-11-22 DIAGNOSIS — F419 Anxiety disorder, unspecified: Secondary | ICD-10-CM | POA: Diagnosis not present

## 2014-11-22 DIAGNOSIS — Z1389 Encounter for screening for other disorder: Secondary | ICD-10-CM | POA: Diagnosis not present

## 2014-11-22 DIAGNOSIS — D510 Vitamin B12 deficiency anemia due to intrinsic factor deficiency: Secondary | ICD-10-CM | POA: Diagnosis not present

## 2014-12-11 DIAGNOSIS — E6609 Other obesity due to excess calories: Secondary | ICD-10-CM | POA: Diagnosis not present

## 2014-12-11 DIAGNOSIS — Z0000 Encounter for general adult medical examination without abnormal findings: Secondary | ICD-10-CM | POA: Diagnosis not present

## 2014-12-11 DIAGNOSIS — I10 Essential (primary) hypertension: Secondary | ICD-10-CM | POA: Diagnosis not present

## 2014-12-11 DIAGNOSIS — D510 Vitamin B12 deficiency anemia due to intrinsic factor deficiency: Secondary | ICD-10-CM | POA: Diagnosis not present

## 2014-12-11 DIAGNOSIS — Z6830 Body mass index (BMI) 30.0-30.9, adult: Secondary | ICD-10-CM | POA: Diagnosis not present

## 2014-12-11 DIAGNOSIS — Z23 Encounter for immunization: Secondary | ICD-10-CM | POA: Diagnosis not present

## 2015-01-11 ENCOUNTER — Ambulatory Visit (HOSPITAL_COMMUNITY)
Admission: RE | Admit: 2015-01-11 | Discharge: 2015-01-11 | Disposition: A | Discharge status: Home / Self Care | Payer: Medicare Other | Source: Ambulatory Visit | Attending: Physician Assistant | Admitting: Physician Assistant

## 2015-01-11 ENCOUNTER — Other Ambulatory Visit (HOSPITAL_COMMUNITY): Payer: Self-pay | Admitting: Physician Assistant

## 2015-01-11 DIAGNOSIS — R0602 Shortness of breath: Secondary | ICD-10-CM | POA: Insufficient documentation

## 2015-01-11 DIAGNOSIS — R42 Dizziness and giddiness: Secondary | ICD-10-CM

## 2015-01-11 DIAGNOSIS — Z6829 Body mass index (BMI) 29.0-29.9, adult: Secondary | ICD-10-CM | POA: Diagnosis not present

## 2015-01-11 DIAGNOSIS — Z1389 Encounter for screening for other disorder: Secondary | ICD-10-CM | POA: Diagnosis not present

## 2015-01-11 DIAGNOSIS — E538 Deficiency of other specified B group vitamins: Secondary | ICD-10-CM | POA: Diagnosis not present

## 2015-01-11 DIAGNOSIS — Z23 Encounter for immunization: Secondary | ICD-10-CM | POA: Diagnosis not present

## 2015-01-11 DIAGNOSIS — G894 Chronic pain syndrome: Secondary | ICD-10-CM | POA: Diagnosis not present

## 2015-02-14 DIAGNOSIS — Z1389 Encounter for screening for other disorder: Secondary | ICD-10-CM | POA: Diagnosis not present

## 2015-02-14 DIAGNOSIS — E663 Overweight: Secondary | ICD-10-CM | POA: Diagnosis not present

## 2015-02-14 DIAGNOSIS — M5410 Radiculopathy, site unspecified: Secondary | ICD-10-CM | POA: Diagnosis not present

## 2015-02-14 DIAGNOSIS — Z6829 Body mass index (BMI) 29.0-29.9, adult: Secondary | ICD-10-CM | POA: Diagnosis not present

## 2015-02-14 DIAGNOSIS — R319 Hematuria, unspecified: Secondary | ICD-10-CM | POA: Diagnosis not present

## 2015-03-22 DIAGNOSIS — E538 Deficiency of other specified B group vitamins: Secondary | ICD-10-CM | POA: Diagnosis not present

## 2015-03-22 DIAGNOSIS — F419 Anxiety disorder, unspecified: Secondary | ICD-10-CM | POA: Diagnosis not present

## 2015-03-22 DIAGNOSIS — Z6828 Body mass index (BMI) 28.0-28.9, adult: Secondary | ICD-10-CM | POA: Diagnosis not present

## 2015-03-22 DIAGNOSIS — G894 Chronic pain syndrome: Secondary | ICD-10-CM | POA: Diagnosis not present

## 2015-03-22 DIAGNOSIS — Z1389 Encounter for screening for other disorder: Secondary | ICD-10-CM | POA: Diagnosis not present

## 2015-04-11 DIAGNOSIS — M47816 Spondylosis without myelopathy or radiculopathy, lumbar region: Secondary | ICD-10-CM | POA: Diagnosis not present

## 2015-04-11 DIAGNOSIS — D3610 Benign neoplasm of peripheral nerves and autonomic nervous system, unspecified: Secondary | ICD-10-CM | POA: Diagnosis not present

## 2015-04-11 DIAGNOSIS — M549 Dorsalgia, unspecified: Secondary | ICD-10-CM | POA: Diagnosis not present

## 2015-04-11 DIAGNOSIS — Z1389 Encounter for screening for other disorder: Secondary | ICD-10-CM | POA: Diagnosis not present

## 2015-04-11 DIAGNOSIS — E559 Vitamin D deficiency, unspecified: Secondary | ICD-10-CM | POA: Diagnosis not present

## 2015-04-11 DIAGNOSIS — Z6829 Body mass index (BMI) 29.0-29.9, adult: Secondary | ICD-10-CM | POA: Diagnosis not present

## 2015-04-11 DIAGNOSIS — E663 Overweight: Secondary | ICD-10-CM | POA: Diagnosis not present

## 2015-06-08 DIAGNOSIS — D510 Vitamin B12 deficiency anemia due to intrinsic factor deficiency: Secondary | ICD-10-CM | POA: Diagnosis not present

## 2015-06-27 DIAGNOSIS — R52 Pain, unspecified: Secondary | ICD-10-CM | POA: Diagnosis not present

## 2015-06-27 DIAGNOSIS — E538 Deficiency of other specified B group vitamins: Secondary | ICD-10-CM | POA: Diagnosis not present

## 2015-06-27 DIAGNOSIS — Z1389 Encounter for screening for other disorder: Secondary | ICD-10-CM | POA: Diagnosis not present

## 2015-06-27 DIAGNOSIS — M5136 Other intervertebral disc degeneration, lumbar region: Secondary | ICD-10-CM | POA: Diagnosis not present

## 2015-06-27 DIAGNOSIS — Z6829 Body mass index (BMI) 29.0-29.9, adult: Secondary | ICD-10-CM | POA: Diagnosis not present

## 2015-07-27 DIAGNOSIS — Z6829 Body mass index (BMI) 29.0-29.9, adult: Secondary | ICD-10-CM | POA: Diagnosis not present

## 2015-07-27 DIAGNOSIS — M545 Low back pain: Secondary | ICD-10-CM | POA: Diagnosis not present

## 2015-07-27 DIAGNOSIS — D492 Neoplasm of unspecified behavior of bone, soft tissue, and skin: Secondary | ICD-10-CM | POA: Diagnosis not present

## 2015-07-27 DIAGNOSIS — Z1389 Encounter for screening for other disorder: Secondary | ICD-10-CM | POA: Diagnosis not present

## 2015-08-22 ENCOUNTER — Other Ambulatory Visit (HOSPITAL_COMMUNITY): Payer: Self-pay | Admitting: Internal Medicine

## 2015-08-28 DIAGNOSIS — Z1211 Encounter for screening for malignant neoplasm of colon: Secondary | ICD-10-CM | POA: Diagnosis not present

## 2015-08-30 DIAGNOSIS — G894 Chronic pain syndrome: Secondary | ICD-10-CM | POA: Diagnosis not present

## 2015-08-30 DIAGNOSIS — E538 Deficiency of other specified B group vitamins: Secondary | ICD-10-CM | POA: Diagnosis not present

## 2015-08-30 DIAGNOSIS — I10 Essential (primary) hypertension: Secondary | ICD-10-CM | POA: Diagnosis not present

## 2015-08-30 DIAGNOSIS — F419 Anxiety disorder, unspecified: Secondary | ICD-10-CM | POA: Diagnosis not present

## 2015-08-30 DIAGNOSIS — E782 Mixed hyperlipidemia: Secondary | ICD-10-CM | POA: Diagnosis not present

## 2015-08-30 DIAGNOSIS — Z1389 Encounter for screening for other disorder: Secondary | ICD-10-CM | POA: Diagnosis not present

## 2015-08-30 DIAGNOSIS — Z6829 Body mass index (BMI) 29.0-29.9, adult: Secondary | ICD-10-CM | POA: Diagnosis not present

## 2015-09-05 NOTE — H&P (Signed)
  NTS SOAP Note  Vital Signs:  Vitals as of: [id number]: Systolic [id number]: Diastolic 94: Heart Rate 83: Temp 97.13F (Temporal): Height 25ft 11.5in: Weight 150Lbs 0 Ounces: Pain Level 5: BMI 29.79   BMI : 29.79 kg/m2  Subjective: This 55 year old female presents for of need for screening TCS and a tender mass in the left thigh.  Has never has a TCS.  Denies family h/o colon cancer.  No abdomiinal pain, constipation, diarrhea, weight loss, blood in stools.  Has a lump underneath the skin on left thigh.  Present for some time, but is increasing in size and tender to touch.  No drainage noted.  Review of Symptoms:  Constitutional:negative headache Eyes:negative Nose/Mouth/Throat:negative Cardiovascular:negative Respiratory:dyspnea Gastrointestinnegative Genitourinary:negative back pain neurofibromas Hematolgic/Lymphatic:negative Allergic/Immunologic:negative   Past Medical History:Reviewed  Past Medical History  Surgical History: none Medical Problems: Neurofibromatosis, HTN Psychiatric History: axiety/depression Allergies: naprosin, phenergan Medications: iron supplements, xanax, percocet, ezetimibe, paroxetine, losartan, amutriptyline coq 10, baby asa, fish oil   Social History:Reviewed  Social History  Preferred Language: English Race:  White Ethnicity: Not Hispanic / Latino Age: 61 year Marital Status:  S Alcohol: no   Smoking Status: Never smoker reviewed on 08/28/2015 Functional Status reviewed on 08/28/2015 ------------------------------------------------ Bathing: Normal Cooking: Normal Dressing: Normal Driving: Normal Eating: Normal Managing Meds: Normal Oral Care: Normal Shopping: Normal Toileting: Normal Transferring: Normal Walking: Normal Cognitive Status reviewed on 08/28/2015 ------------------------------------------------ Attention: Normal Decision Making: Normal Language: Normal Memory: Normal Motor: Normal Perception:  Normal Problem Solving: Normal Visual and Spatial: Normal   Family History:Reviewed  Family Health History Mother, Deceased; Stroke (CVA);  Father, Deceased; Cancer unspecified;     Objective Information: General:Well appearing, well nourished in no distress. neurofibromas throughout body.  3x5cm left thigh subcutaneous rubbery mass, tender to touch, no erythema noted. Neck:Supple without lymphadenopathy.  Heart:RRR, no murmur or gallop.  Normal S1, S2.  No S3, S4.  Lungs:CTA bilaterally, no wheezes, rhonchi, rales.  Breathing unlabored. Abdomen:Soft, NT/ND, normal bowel sounds, no HSM, no masses.  No peritoneal signs. deferred to procedure  Assessment:Need for screening TCS Subcutaeous mass, left thigh  Diagnoses: V76.51  Z12.11 Screening for malignant neoplasm of colon (Encounter for screening for malignant neoplasm of colon)  Procedures: [id number] - OFFICE OUTPATIENT NEW 30 MINUTES    Plan:  Scheduled for screening TCS on 02/02/16.  Trilyte prescribed.  Will address left thigh mass at a later date.   Patient Education:Alternative treatments to surgery were discussed with patient (and family).Risks and benefits  of procedure including bleeding and perforation were fully explained to the patient (and family) who gave informed consent. Patient/family questions were addressed.  Follow-up:Pending Surgery

## 2015-10-02 ENCOUNTER — Encounter (HOSPITAL_COMMUNITY): Admission: RE | Payer: Self-pay | Source: Ambulatory Visit

## 2015-10-02 ENCOUNTER — Ambulatory Visit (HOSPITAL_COMMUNITY): Admission: RE | Admit: 2015-10-02 | Payer: Medicare Other | Source: Ambulatory Visit | Admitting: General Surgery

## 2015-10-02 SURGERY — COLONOSCOPY
Anesthesia: Moderate Sedation

## 2015-10-04 ENCOUNTER — Other Ambulatory Visit (HOSPITAL_COMMUNITY): Payer: Self-pay | Admitting: Family Medicine

## 2015-10-04 DIAGNOSIS — Q8509 Other neurofibromatosis: Secondary | ICD-10-CM | POA: Diagnosis not present

## 2015-10-04 DIAGNOSIS — E663 Overweight: Secondary | ICD-10-CM | POA: Diagnosis not present

## 2015-10-04 DIAGNOSIS — R51 Headache: Secondary | ICD-10-CM | POA: Diagnosis not present

## 2015-10-04 DIAGNOSIS — Z1231 Encounter for screening mammogram for malignant neoplasm of breast: Secondary | ICD-10-CM

## 2015-10-04 DIAGNOSIS — Z6828 Body mass index (BMI) 28.0-28.9, adult: Secondary | ICD-10-CM | POA: Diagnosis not present

## 2015-10-04 DIAGNOSIS — F419 Anxiety disorder, unspecified: Secondary | ICD-10-CM | POA: Diagnosis not present

## 2015-10-04 DIAGNOSIS — T402X5A Adverse effect of other opioids, initial encounter: Secondary | ICD-10-CM | POA: Diagnosis not present

## 2015-10-10 ENCOUNTER — Ambulatory Visit (HOSPITAL_COMMUNITY): Payer: Self-pay

## 2015-10-15 ENCOUNTER — Ambulatory Visit (HOSPITAL_COMMUNITY)
Admission: RE | Admit: 2015-10-15 | Discharge: 2015-10-15 | Disposition: A | Discharge status: Home / Self Care | Payer: Medicare Other | Source: Ambulatory Visit | Attending: Family Medicine | Admitting: Family Medicine

## 2015-10-15 DIAGNOSIS — Z1231 Encounter for screening mammogram for malignant neoplasm of breast: Secondary | ICD-10-CM | POA: Diagnosis not present

## 2015-10-16 DIAGNOSIS — E785 Hyperlipidemia, unspecified: Secondary | ICD-10-CM | POA: Diagnosis not present

## 2015-10-16 DIAGNOSIS — F339 Major depressive disorder, recurrent, unspecified: Secondary | ICD-10-CM | POA: Diagnosis not present

## 2015-10-16 DIAGNOSIS — M5136 Other intervertebral disc degeneration, lumbar region: Secondary | ICD-10-CM | POA: Diagnosis not present

## 2015-10-16 DIAGNOSIS — M545 Low back pain: Secondary | ICD-10-CM | POA: Diagnosis not present

## 2015-10-16 DIAGNOSIS — Q8500 Neurofibromatosis, unspecified: Secondary | ICD-10-CM | POA: Diagnosis not present

## 2015-10-16 DIAGNOSIS — K5903 Drug induced constipation: Secondary | ICD-10-CM | POA: Diagnosis not present

## 2015-10-16 DIAGNOSIS — D519 Vitamin B12 deficiency anemia, unspecified: Secondary | ICD-10-CM | POA: Diagnosis not present

## 2015-10-16 DIAGNOSIS — G4459 Other complicated headache syndrome: Secondary | ICD-10-CM | POA: Diagnosis not present

## 2015-10-16 DIAGNOSIS — I10 Essential (primary) hypertension: Secondary | ICD-10-CM | POA: Diagnosis not present

## 2015-10-16 DIAGNOSIS — G4709 Other insomnia: Secondary | ICD-10-CM | POA: Diagnosis not present

## 2015-10-16 DIAGNOSIS — F419 Anxiety disorder, unspecified: Secondary | ICD-10-CM | POA: Diagnosis not present

## 2015-10-16 DIAGNOSIS — D329 Benign neoplasm of meninges, unspecified: Secondary | ICD-10-CM | POA: Diagnosis not present

## 2015-10-18 ENCOUNTER — Other Ambulatory Visit: Payer: Medicare Other | Admitting: Adult Health

## 2015-10-19 ENCOUNTER — Other Ambulatory Visit (HOSPITAL_COMMUNITY): Payer: Self-pay | Admitting: Neurology

## 2015-10-19 DIAGNOSIS — D329 Benign neoplasm of meninges, unspecified: Secondary | ICD-10-CM

## 2015-10-19 DIAGNOSIS — Q8500 Neurofibromatosis, unspecified: Secondary | ICD-10-CM

## 2015-10-30 ENCOUNTER — Other Ambulatory Visit: Payer: Medicare Other | Admitting: Adult Health

## 2015-10-31 ENCOUNTER — Ambulatory Visit (HOSPITAL_COMMUNITY): Admission: RE | Admit: 2015-10-31 | Payer: Medicare Other | Source: Ambulatory Visit

## 2015-11-02 DIAGNOSIS — M545 Low back pain: Secondary | ICD-10-CM | POA: Diagnosis not present

## 2015-11-02 DIAGNOSIS — E663 Overweight: Secondary | ICD-10-CM | POA: Diagnosis not present

## 2015-11-02 DIAGNOSIS — Z6828 Body mass index (BMI) 28.0-28.9, adult: Secondary | ICD-10-CM | POA: Diagnosis not present

## 2015-11-02 DIAGNOSIS — F419 Anxiety disorder, unspecified: Secondary | ICD-10-CM | POA: Diagnosis not present

## 2015-11-21 ENCOUNTER — Ambulatory Visit (INDEPENDENT_AMBULATORY_CARE_PROVIDER_SITE_OTHER): Payer: Medicare Other | Admitting: Adult Health

## 2015-11-21 ENCOUNTER — Encounter: Payer: Self-pay | Admitting: Adult Health

## 2015-11-21 ENCOUNTER — Encounter (INDEPENDENT_AMBULATORY_CARE_PROVIDER_SITE_OTHER): Payer: Self-pay

## 2015-11-21 ENCOUNTER — Other Ambulatory Visit (HOSPITAL_COMMUNITY)
Admission: RE | Admit: 2015-11-21 | Discharge: 2015-11-21 | Disposition: A | Discharge status: Home / Self Care | Payer: Medicare Other | Source: Ambulatory Visit | Attending: Adult Health | Admitting: Adult Health

## 2015-11-21 VITALS — BP 138/86 | HR 74 | Ht 58.5 in | Wt 145.0 lb

## 2015-11-21 DIAGNOSIS — Z1211 Encounter for screening for malignant neoplasm of colon: Secondary | ICD-10-CM | POA: Diagnosis not present

## 2015-11-21 DIAGNOSIS — Z01419 Encounter for gynecological examination (general) (routine) without abnormal findings: Secondary | ICD-10-CM

## 2015-11-21 DIAGNOSIS — K5900 Constipation, unspecified: Secondary | ICD-10-CM

## 2015-11-21 DIAGNOSIS — Z139 Encounter for screening, unspecified: Secondary | ICD-10-CM

## 2015-11-21 DIAGNOSIS — K649 Unspecified hemorrhoids: Secondary | ICD-10-CM | POA: Diagnosis not present

## 2015-11-21 DIAGNOSIS — Z124 Encounter for screening for malignant neoplasm of cervix: Secondary | ICD-10-CM

## 2015-11-21 DIAGNOSIS — Z1151 Encounter for screening for human papillomavirus (HPV): Secondary | ICD-10-CM | POA: Diagnosis not present

## 2015-11-21 LAB — HEMOCCULT GUIAC POC 1CARD (OFFICE): FECAL OCCULT BLD: NEGATIVE

## 2015-11-21 NOTE — Progress Notes (Signed)
Patient ID: Julia [name], female   DOB: 09-Jan-1961, 55 y.o.   MRN: [id number] History of Present Illness:  Julia [name] is a 55 year old white female in for a well woman gyn exam and pap.She has constipation and hemorrhoids that bled at times.She stopped linzess. PCP is Julia Brooklyn PA at [facility].  Current Medications, Allergies, Past Medical History, Past Surgical History, Family History and Social History were reviewed in [employer] record.     Review of Systems: Patient denies any headaches, hearing loss, fatigue, blurred vision, shortness of breath, chest pain, abdominal pain, problems with  urination, or intercourse(not having sex). No joint pain or mood swings. See HPI for positives.   Physical Exam:BP 138/86 (BP Location: Left Arm, Patient Position: Sitting, Cuff Size: Normal)   Pulse 74   Ht 4' 10.5" (1.486 m)   Wt 145 lb (65.8 kg)   LMP 09/06/2012   BMI 29.79 kg/m  General:  Well developed, well nourished, no acute distress Skin:  Warm and dry, has numerous moles and skin tags  Neck:  Midline trachea, normal thyroid, good ROM, no lymphadenopathy Lungs; Clear to auscultation bilaterally Breast:  No dominant palpable mass, retraction, or nipple discharge Cardiovascular: Regular rate and rhythm Abdomen:  Soft, non tender, no hepatosplenomegaly Pelvic:  External genitalia is normal in appearance, 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  The vagina is normal in appearance. Urethra has no lesions or masses. The cervix is smooth, pap with HPV performed. Uterus is felt to be normal size, shape, and contour.  No adnexal masses or tenderness noted.Bladder is non tender, no masses felt. Rectal: Good sphincter tone, no polyps, + hemorrhoids felt.  Hemoccult negative. Extremities/musculoskeletal:  No swelling or varicosities noted, no clubbing or cyanosis Psych:  No mood changes, alert and cooperative,seems happy   Impression:  1. Encounter for gynecological  examination with Papanicolaou smear of cervix   2. Routine cervical smear   3. Hemorrhoids, unspecified hemorrhoid type   4. Constipation, unspecified constipation type   5. Screening      Plan: Pap with HPV sent Physical in 1 year, pap in 3 if normal Referred to Dr Oneida Alar for colonoscopy Mammogram yearly Labs with PCP

## 2015-11-21 NOTE — Patient Instructions (Signed)
Physical in 1 year, pap in 3 if normal Mammogram yearly Colonoscopy advised referred to Dr Learta Codding With PCP Get flu shot

## 2015-11-22 LAB — CYTOLOGY - PAP

## 2015-11-26 ENCOUNTER — Encounter: Payer: Self-pay | Admitting: Gastroenterology

## 2015-11-28 DIAGNOSIS — F419 Anxiety disorder, unspecified: Secondary | ICD-10-CM | POA: Diagnosis not present

## 2015-11-28 DIAGNOSIS — E663 Overweight: Secondary | ICD-10-CM | POA: Diagnosis not present

## 2015-11-28 DIAGNOSIS — Q8509 Other neurofibromatosis: Secondary | ICD-10-CM | POA: Diagnosis not present

## 2015-11-28 DIAGNOSIS — M5116 Intervertebral disc disorders with radiculopathy, lumbar region: Secondary | ICD-10-CM | POA: Diagnosis not present

## 2015-11-28 DIAGNOSIS — M5410 Radiculopathy, site unspecified: Secondary | ICD-10-CM | POA: Diagnosis not present

## 2015-11-28 DIAGNOSIS — Z6828 Body mass index (BMI) 28.0-28.9, adult: Secondary | ICD-10-CM | POA: Diagnosis not present

## 2015-11-29 DIAGNOSIS — E785 Hyperlipidemia, unspecified: Secondary | ICD-10-CM | POA: Diagnosis not present

## 2015-11-29 DIAGNOSIS — M461 Sacroiliitis, not elsewhere classified: Secondary | ICD-10-CM | POA: Diagnosis not present

## 2015-11-29 DIAGNOSIS — D329 Benign neoplasm of meninges, unspecified: Secondary | ICD-10-CM | POA: Diagnosis not present

## 2015-11-29 DIAGNOSIS — Z79891 Long term (current) use of opiate analgesic: Secondary | ICD-10-CM | POA: Diagnosis not present

## 2015-11-29 DIAGNOSIS — Q8500 Neurofibromatosis, unspecified: Secondary | ICD-10-CM | POA: Diagnosis not present

## 2015-11-29 DIAGNOSIS — I10 Essential (primary) hypertension: Secondary | ICD-10-CM | POA: Diagnosis not present

## 2015-11-29 DIAGNOSIS — F419 Anxiety disorder, unspecified: Secondary | ICD-10-CM | POA: Diagnosis not present

## 2015-11-29 DIAGNOSIS — F339 Major depressive disorder, recurrent, unspecified: Secondary | ICD-10-CM | POA: Diagnosis not present

## 2015-11-29 DIAGNOSIS — M545 Low back pain: Secondary | ICD-10-CM | POA: Diagnosis not present

## 2015-11-29 DIAGNOSIS — G4459 Other complicated headache syndrome: Secondary | ICD-10-CM | POA: Diagnosis not present

## 2015-11-29 DIAGNOSIS — M5136 Other intervertebral disc degeneration, lumbar region: Secondary | ICD-10-CM | POA: Diagnosis not present

## 2015-11-29 DIAGNOSIS — K5903 Drug induced constipation: Secondary | ICD-10-CM | POA: Diagnosis not present

## 2015-11-29 DIAGNOSIS — G4709 Other insomnia: Secondary | ICD-10-CM | POA: Diagnosis not present

## 2015-12-24 ENCOUNTER — Ambulatory Visit (HOSPITAL_COMMUNITY): Payer: Medicare Other

## 2016-01-02 ENCOUNTER — Ambulatory Visit: Payer: Self-pay | Admitting: Gastroenterology

## 2016-01-02 ENCOUNTER — Telehealth: Payer: Self-pay | Admitting: Gastroenterology

## 2016-01-02 ENCOUNTER — Encounter: Payer: Self-pay | Admitting: Gastroenterology

## 2016-01-02 NOTE — Progress Notes (Deleted)
   Subjective:    Patient ID: Julia [name], female    DOB: 09/03/1960, 55 y.o.   MRN: [id number]  HPI    Review of Systems     Objective:   Physical Exam        Assessment & Plan:

## 2016-01-02 NOTE — Telephone Encounter (Signed)
PT WAS A NO SHOW AND LETTER SENT  °

## 2016-01-02 NOTE — Telephone Encounter (Signed)
REVIEWED-NO ADDITIONAL RECOMMENDATIONS. 

## 2016-01-03 DIAGNOSIS — F329 Major depressive disorder, single episode, unspecified: Secondary | ICD-10-CM | POA: Diagnosis not present

## 2016-01-03 DIAGNOSIS — E663 Overweight: Secondary | ICD-10-CM | POA: Diagnosis not present

## 2016-01-03 DIAGNOSIS — F419 Anxiety disorder, unspecified: Secondary | ICD-10-CM | POA: Diagnosis not present

## 2016-01-03 DIAGNOSIS — Z23 Encounter for immunization: Secondary | ICD-10-CM | POA: Diagnosis not present

## 2016-01-03 DIAGNOSIS — Z1389 Encounter for screening for other disorder: Secondary | ICD-10-CM | POA: Diagnosis not present

## 2016-01-03 DIAGNOSIS — I10 Essential (primary) hypertension: Secondary | ICD-10-CM | POA: Diagnosis not present

## 2016-01-03 DIAGNOSIS — G894 Chronic pain syndrome: Secondary | ICD-10-CM | POA: Diagnosis not present

## 2016-01-03 DIAGNOSIS — E538 Deficiency of other specified B group vitamins: Secondary | ICD-10-CM | POA: Diagnosis not present

## 2016-01-03 DIAGNOSIS — M47816 Spondylosis without myelopathy or radiculopathy, lumbar region: Secondary | ICD-10-CM | POA: Diagnosis not present

## 2016-01-03 DIAGNOSIS — Z6828 Body mass index (BMI) 28.0-28.9, adult: Secondary | ICD-10-CM | POA: Diagnosis not present

## 2016-01-03 DIAGNOSIS — R300 Dysuria: Secondary | ICD-10-CM | POA: Diagnosis not present

## 2016-01-22 DIAGNOSIS — Z6827 Body mass index (BMI) 27.0-27.9, adult: Secondary | ICD-10-CM | POA: Diagnosis not present

## 2016-01-22 DIAGNOSIS — E782 Mixed hyperlipidemia: Secondary | ICD-10-CM | POA: Diagnosis not present

## 2016-01-22 DIAGNOSIS — I10 Essential (primary) hypertension: Secondary | ICD-10-CM | POA: Diagnosis not present

## 2016-01-22 DIAGNOSIS — K5289 Other specified noninfective gastroenteritis and colitis: Secondary | ICD-10-CM | POA: Diagnosis not present

## 2016-02-06 ENCOUNTER — Ambulatory Visit (HOSPITAL_COMMUNITY)
Admission: RE | Admit: 2016-02-06 | Discharge: 2016-02-06 | Disposition: A | Discharge status: Home / Self Care | Payer: Medicare Other | Source: Ambulatory Visit | Attending: Neurology | Admitting: Neurology

## 2016-02-06 DIAGNOSIS — Q8500 Neurofibromatosis, unspecified: Secondary | ICD-10-CM

## 2016-02-06 DIAGNOSIS — D320 Benign neoplasm of cerebral meninges: Secondary | ICD-10-CM | POA: Insufficient documentation

## 2016-02-06 DIAGNOSIS — D329 Benign neoplasm of meninges, unspecified: Secondary | ICD-10-CM | POA: Diagnosis not present

## 2016-02-06 DIAGNOSIS — I6782 Cerebral ischemia: Secondary | ICD-10-CM | POA: Diagnosis not present

## 2016-02-06 MED ORDER — GADOBENATE DIMEGLUMINE 529 MG/ML IV SOLN
15.0000 mL | Freq: Once | INTRAVENOUS | Status: AC | PRN
  Administered 2016-02-06: 13 mL via INTRAVENOUS

## 2016-02-11 DIAGNOSIS — D329 Benign neoplasm of meninges, unspecified: Secondary | ICD-10-CM | POA: Diagnosis not present

## 2016-02-11 DIAGNOSIS — K5903 Drug induced constipation: Secondary | ICD-10-CM | POA: Diagnosis not present

## 2016-02-11 DIAGNOSIS — M461 Sacroiliitis, not elsewhere classified: Secondary | ICD-10-CM | POA: Diagnosis not present

## 2016-02-11 DIAGNOSIS — E785 Hyperlipidemia, unspecified: Secondary | ICD-10-CM | POA: Diagnosis not present

## 2016-02-11 DIAGNOSIS — G4459 Other complicated headache syndrome: Secondary | ICD-10-CM | POA: Diagnosis not present

## 2016-02-11 DIAGNOSIS — Q8500 Neurofibromatosis, unspecified: Secondary | ICD-10-CM | POA: Diagnosis not present

## 2016-02-11 DIAGNOSIS — F419 Anxiety disorder, unspecified: Secondary | ICD-10-CM | POA: Diagnosis not present

## 2016-02-11 DIAGNOSIS — I10 Essential (primary) hypertension: Secondary | ICD-10-CM | POA: Diagnosis not present

## 2016-02-11 DIAGNOSIS — Z79891 Long term (current) use of opiate analgesic: Secondary | ICD-10-CM | POA: Diagnosis not present

## 2016-02-11 DIAGNOSIS — G4709 Other insomnia: Secondary | ICD-10-CM | POA: Diagnosis not present

## 2016-02-11 DIAGNOSIS — M545 Low back pain: Secondary | ICD-10-CM | POA: Diagnosis not present

## 2016-02-11 DIAGNOSIS — F339 Major depressive disorder, recurrent, unspecified: Secondary | ICD-10-CM | POA: Diagnosis not present

## 2016-03-09 ENCOUNTER — Encounter (HOSPITAL_COMMUNITY): Payer: Self-pay

## 2016-03-09 ENCOUNTER — Emergency Department (HOSPITAL_COMMUNITY)
Admission: EM | Admit: 2016-03-09 | Discharge: 2016-03-10 | Disposition: A | Discharge status: Home / Self Care | Payer: Medicare Other | Attending: Emergency Medicine | Admitting: Emergency Medicine

## 2016-03-09 DIAGNOSIS — Z79899 Other long term (current) drug therapy: Secondary | ICD-10-CM | POA: Insufficient documentation

## 2016-03-09 DIAGNOSIS — I10 Essential (primary) hypertension: Secondary | ICD-10-CM | POA: Diagnosis not present

## 2016-03-09 DIAGNOSIS — Z87891 Personal history of nicotine dependence: Secondary | ICD-10-CM | POA: Diagnosis not present

## 2016-03-09 DIAGNOSIS — R197 Diarrhea, unspecified: Secondary | ICD-10-CM | POA: Diagnosis not present

## 2016-03-09 DIAGNOSIS — E876 Hypokalemia: Secondary | ICD-10-CM | POA: Diagnosis not present

## 2016-03-09 DIAGNOSIS — R112 Nausea with vomiting, unspecified: Secondary | ICD-10-CM

## 2016-03-09 LAB — CBC WITH DIFFERENTIAL/PLATELET
BASOS ABS: 0 10*3/uL (ref 0.0–0.1)
BASOS PCT: 0 %
Eosinophils Absolute: 0 10*3/uL (ref 0.0–0.7)
Eosinophils Relative: 0 %
HEMATOCRIT: 43.8 % (ref 36.0–46.0)
HEMOGLOBIN: 15 g/dL (ref 12.0–15.0)
Lymphocytes Relative: 7 %
Lymphs Abs: 0.7 10*3/uL (ref 0.7–4.0)
MCH: 31 pg (ref 26.0–34.0)
MCHC: 34.2 g/dL (ref 30.0–36.0)
MCV: 90.5 fL (ref 78.0–100.0)
MONOS PCT: 4 %
Monocytes Absolute: 0.4 10*3/uL (ref 0.1–1.0)
NEUTROS ABS: 8.4 10*3/uL — AB (ref 1.7–7.7)
NEUTROS PCT: 89 %
Platelets: 228 10*3/uL (ref 150–400)
RBC: 4.84 MIL/uL (ref 3.87–5.11)
RDW: 13.1 % (ref 11.5–15.5)
WBC: 9.4 10*3/uL (ref 4.0–10.5)

## 2016-03-09 LAB — URINALYSIS, ROUTINE W REFLEX MICROSCOPIC
BACTERIA UA: NONE SEEN
BILIRUBIN URINE: NEGATIVE
Glucose, UA: NEGATIVE mg/dL
HGB URINE DIPSTICK: NEGATIVE
KETONES UR: NEGATIVE mg/dL
LEUKOCYTES UA: NEGATIVE
NITRITE: NEGATIVE
Protein, ur: 30 mg/dL — AB
Specific Gravity, Urine: 1.023 (ref 1.005–1.030)
pH: 7 (ref 5.0–8.0)

## 2016-03-09 MED ORDER — SODIUM CHLORIDE 0.9 % IV BOLUS (SEPSIS)
1000.0000 mL | Freq: Once | INTRAVENOUS | Status: AC
  Administered 2016-03-09: 1000 mL via INTRAVENOUS

## 2016-03-09 MED ORDER — ONDANSETRON HCL 4 MG/2ML IJ SOLN
4.0000 mg | Freq: Once | INTRAMUSCULAR | Status: AC
  Administered 2016-03-09: 4 mg via INTRAVENOUS
  Filled 2016-03-09: qty 2

## 2016-03-09 MED ORDER — DICYCLOMINE HCL 10 MG/ML IM SOLN
20.0000 mg | Freq: Once | INTRAMUSCULAR | Status: AC
  Administered 2016-03-09: 20 mg via INTRAMUSCULAR
  Filled 2016-03-09: qty 2

## 2016-03-09 NOTE — ED Provider Notes (Signed)
By signing my name below, I, Soijett Blue, attest that this documentation has been prepared under the direction and in the presence of [employer], DO. Electronically Signed: Soijett Blue, ED Scribe. 03/09/16. 11:14 PM.  TIME SEEN: 11:11 PM  CHIEF COMPLAINT: Emesis; Diarrhea  HPI: Julia [name] is a 55 y.o. female with a PMHx of HTN, HLD who presents to the Emergency Department complaining of emesis x 3-4 episodes onset PTA. Pt is having associated symptoms of nausea, diarrhea x 3-4 episodes, abdominal cramping, and chills. She has not tried any medications for the relief of her symptoms. She denies CP, shortness of breath, fever, melena, blood in stool, dysuria, hematuria, vaginal bleeding, vaginal discharge, and any other symptoms. Denies sick contacts or recent foreign travel. Denies abdominal surgeries.  ROS: See HPI Constitutional: no fever  Eyes: no drainage  ENT: no runny nose   Cardiovascular:  no chest pain  Resp: no SOB  GI: +vomiting GU: no dysuria Integumentary: no rash  Allergy: no hives  Musculoskeletal: no leg swelling  Neurological: no slurred speech ROS otherwise negative  PAST MEDICAL HISTORY/PAST SURGICAL HISTORY:  Past Medical History:  Diagnosis Date  . Anxiety   . Chronic pain   . Elevated cholesterol   . Headache(784.0)   . Hypertension   . Numerous moles   . Trichimoniasis     MEDICATIONS:  Prior to Admission medications   Medication Sig Start Date End Date Taking? Authorizing Provider  ALPRAZolam Duanne Moron) 1 MG tablet Take 1 mg by mouth 4 (four) times daily as needed for anxiety.  05/17/12  Yes Historical Provider, MD  amitriptyline (ELAVIL) 25 MG tablet Take 25 mg by mouth at bedtime.  05/22/12  Yes Historical Provider, MD  hydrochlorothiazide (HYDRODIURIL) 12.5 MG tablet Take 1 tablet by mouth daily. 08/02/15  Yes Historical Provider, MD  losartan (COZAAR) 50 MG tablet 50 mg daily.  09/01/13  Yes Historical Provider, MD  oxyCODONE-acetaminophen  (PERCOCET/ROXICET) 5-325 MG tablet Take 1 tablet by mouth 4 (four) times daily. 08/07/15  Yes Historical Provider, MD  PARoxetine (PAXIL) 20 MG tablet Take 20 mg by mouth at bedtime. 05/22/12  Yes Historical Provider, MD  pravastatin (PRAVACHOL) 40 MG tablet Take 40 mg by mouth daily. 05/20/12  Yes Historical Provider, MD  ferrous sulfate 325 (65 FE) MG tablet Take 325 mg by mouth at bedtime.    Historical Provider, MD  Linaclotide Rolan Lipa) 145 MCG CAPS 1 PO 30 mins prior to your first meal 09/30/12   Danie Binder, MD    ALLERGIES:  Allergies  Allergen Reactions  . Naprosyn [Naproxen] Nausea Only  . Penicillins Nausea And Vomiting    Has patient had a PCN reaction causing immediate rash, facial/tongue/throat swelling, SOB or lightheadedness with hypotension: No Has patient had a PCN reaction causing severe rash involving mucus membranes or skin necrosis: No Has patient had a PCN reaction that required hospitalization Yes Has patient had a PCN reaction occurring within the last 10 years: No If all of the above answers are "NO", then may proceed with Cephalosporin use.   . Promethazine Hcl Nausea Only    SOCIAL HISTORY:  Social History  Substance Use Topics  . Smoking status: Former Smoker    Types: Cigarettes  . Smokeless tobacco: Never Used  . Alcohol use No    FAMILY HISTORY: Family History  Problem Relation Age of Onset  . Stroke Mother   . Heart failure Mother   . Cancer Father  lung  . Diabetes Brother     EXAM: BP 117/75 (BP Location: Left Arm)   Pulse 118   Temp 98.1 F (36.7 C) (Oral)   Resp 22   Ht 5' (1.524 m)   Wt 141 lb (64 kg)   LMP 09/06/2012   SpO2 100%   BMI 27.54 kg/m  CONSTITUTIONAL: Alert and oriented and responds appropriately to questions. Well-appearing; well-nourished, Appears uncomfortable but is afebrile and nontoxic HEAD: Normocephalic EYES: Conjunctivae clear, PERRL, EOMI ENT: normal nose; no rhinorrhea; moist mucous membranes NECK:  Supple, no meningismus, no nuchal rigidity, no LAD  CARD: regular and tachycardic; S1 and S2 appreciated; no murmurs, no clicks, no rubs, no gallops RESP: Normal chest excursion without splinting or tachypnea; breath sounds clear and equal bilaterally; no wheezes, no rhonchi, no rales, no hypoxia or respiratory distress, speaking full sentences ABD/GI: Normal bowel sounds; non-distended; soft, non-tender, no rebound, no guarding, no peritoneal signs, no hepatosplenomegaly BACK:  The back appears normal and is non-tender to palpation, there is no CVA tenderness EXT: Normal ROM in all joints; non-tender to palpation; no edema; normal capillary refill; no cyanosis, no calf tenderness or swelling    SKIN: Normal color for age and race; warm; no rash NEURO: Moves all extremities equally, sensation to light touch intact diffusely, cranial nerves II through XII intact, normal speech PSYCH: The patient's mood and manner are appropriate. Grooming and personal hygiene are appropriate.  MEDICAL DECISION MAKING: Patient here with nausea, vomiting and diarrhea that started just prior to arrival. Complains of abdominal cramping. No fevers or patient has had chills. Abdominal exam completely benign. Suspect viral illness. Treat symptomatically with IV fluids, Bentyl, Zofran. Will check labs, urine.  ED PROGRESS: 1:00 AM  Pt reports feeling much better. Labs show potassium of 2.7 but no interval changes on her EKG. We'll give oral potassium. She is asymptomatic. Mild elevation of total bilirubin and AST but otherwise LFTs, lipase normal. No tenderness in the epigastric region or right upper quadrant. We will fluid challenge and recheck vital signs.  1:10 AM  Pt has been able to drink. Vital signs have returned to baseline. I feel she is safe to be discharged home. Has a follow-up appointment already scheduled with her PCP this week on Thursday, January 4. She can have her potassium rechecked at that time. Will  discharge with potassium tablets for the next several days, Zofran, Bentyl, Imodium. Discussed return precautions. She verbalized understanding and is comfortable with this plan.   I reviewed all nursing notes, vitals, pertinent old records, EKGs, labs, imaging (as available).     EKG Interpretation  Date/Time:  Sunday March 09 2016 23:06:47 EST Ventricular Rate:  122 PR Interval:    QRS Duration: 85 QT Interval:  325 QTC Calculation: 462 R Axis:   21 Text Interpretation:  Sinus tachycardia Low voltage, precordial leads Borderline repolarization abnormality No old tracing to compare Confirmed by Danetra Glock,  DO, Ryun Velez ([id number]) on 03/09/2016 11:08:55 PM        I personally performed the services described in this documentation, which was scribed in my presence. The recorded information has been reviewed and is accurate.     Clyde, DO 03/10/16 [phone]

## 2016-03-09 NOTE — ED Triage Notes (Signed)
Nausea, vomiting and diarrhea x several hours with abd cramping

## 2016-03-10 DIAGNOSIS — Z79899 Other long term (current) drug therapy: Secondary | ICD-10-CM | POA: Diagnosis not present

## 2016-03-10 DIAGNOSIS — I10 Essential (primary) hypertension: Secondary | ICD-10-CM | POA: Diagnosis not present

## 2016-03-10 DIAGNOSIS — Z87891 Personal history of nicotine dependence: Secondary | ICD-10-CM | POA: Diagnosis not present

## 2016-03-10 DIAGNOSIS — R112 Nausea with vomiting, unspecified: Secondary | ICD-10-CM | POA: Diagnosis present

## 2016-03-10 DIAGNOSIS — R197 Diarrhea, unspecified: Secondary | ICD-10-CM | POA: Diagnosis not present

## 2016-03-10 DIAGNOSIS — E876 Hypokalemia: Secondary | ICD-10-CM | POA: Diagnosis not present

## 2016-03-10 LAB — COMPREHENSIVE METABOLIC PANEL
ALBUMIN: 4.5 g/dL (ref 3.5–5.0)
ALT: 50 U/L (ref 14–54)
ANION GAP: 13 (ref 5–15)
AST: 53 U/L — ABNORMAL HIGH (ref 15–41)
Alkaline Phosphatase: 87 U/L (ref 38–126)
BILIRUBIN TOTAL: 2.3 mg/dL — AB (ref 0.3–1.2)
BUN: 16 mg/dL (ref 6–20)
CHLORIDE: 102 mmol/L (ref 101–111)
CO2: 24 mmol/L (ref 22–32)
Calcium: 10.9 mg/dL — ABNORMAL HIGH (ref 8.9–10.3)
Creatinine, Ser: 1.01 mg/dL — ABNORMAL HIGH (ref 0.44–1.00)
GFR calc Af Amer: >60 mL/min (ref >60)
GLUCOSE: 142 mg/dL — AB (ref 65–99)
POTASSIUM: 2.7 mmol/L — AB (ref 3.5–5.1)
Sodium: 139 mmol/L (ref 135–145)
TOTAL PROTEIN: 7.6 g/dL (ref 6.5–8.1)

## 2016-03-10 LAB — LIPASE, BLOOD: LIPASE: 24 U/L (ref 11–51)

## 2016-03-10 MED ORDER — ONDANSETRON 4 MG PO TBDP: 4.0000 mg | ORAL_TABLET | Freq: Three times a day (TID) | ORAL | 0 refills | Status: DC | PRN

## 2016-03-10 MED ORDER — POTASSIUM CHLORIDE CRYS ER 20 MEQ PO TBCR
40.0000 meq | EXTENDED_RELEASE_TABLET | Freq: Once | ORAL | Status: AC
  Administered 2016-03-10: 40 meq via ORAL
  Filled 2016-03-10: qty 2

## 2016-03-10 MED ORDER — DICYCLOMINE HCL 20 MG PO TABS: 20.0000 mg | ORAL_TABLET | Freq: Three times a day (TID) | ORAL | 0 refills | Status: DC

## 2016-03-10 MED ORDER — LOPERAMIDE HCL 2 MG PO CAPS: 2.0000 mg | ORAL_CAPSULE | Freq: Four times a day (QID) | ORAL | 0 refills | Status: DC | PRN

## 2016-03-10 MED ORDER — POTASSIUM CHLORIDE ER 10 MEQ PO TBCR: 10.0000 meq | EXTENDED_RELEASE_TABLET | Freq: Two times a day (BID) | ORAL | 0 refills | Status: DC

## 2016-03-10 NOTE — ED Notes (Signed)
Pt's heart rate when pt sat up in the bed went to 114.

## 2016-03-10 NOTE — ED Notes (Signed)
CRITICAL VALUE ALERT  Critical value received: Potassium 2.7 Date of notification: 03/10/16 Time of notification: 0007 Critical value read back:Yes.   Nurse who received alert: Octavia Bruckner, RN MD notified (1st page):Dr. Ward Time of first page:  0007 Responding MD:  Dr. Leonides Schanz Time MD responded:  [phone]

## 2016-03-12 DIAGNOSIS — I10 Essential (primary) hypertension: Secondary | ICD-10-CM | POA: Diagnosis not present

## 2016-03-12 DIAGNOSIS — M461 Sacroiliitis, not elsewhere classified: Secondary | ICD-10-CM | POA: Diagnosis not present

## 2016-03-12 DIAGNOSIS — Z79891 Long term (current) use of opiate analgesic: Secondary | ICD-10-CM | POA: Diagnosis not present

## 2016-03-12 DIAGNOSIS — M545 Low back pain: Secondary | ICD-10-CM | POA: Diagnosis not present

## 2016-03-12 DIAGNOSIS — G4459 Other complicated headache syndrome: Secondary | ICD-10-CM | POA: Diagnosis not present

## 2016-03-12 DIAGNOSIS — F339 Major depressive disorder, recurrent, unspecified: Secondary | ICD-10-CM | POA: Diagnosis not present

## 2016-03-12 DIAGNOSIS — D329 Benign neoplasm of meninges, unspecified: Secondary | ICD-10-CM | POA: Diagnosis not present

## 2016-03-12 DIAGNOSIS — G4709 Other insomnia: Secondary | ICD-10-CM | POA: Diagnosis not present

## 2016-03-12 DIAGNOSIS — E785 Hyperlipidemia, unspecified: Secondary | ICD-10-CM | POA: Diagnosis not present

## 2016-03-12 DIAGNOSIS — K5903 Drug induced constipation: Secondary | ICD-10-CM | POA: Diagnosis not present

## 2016-03-12 DIAGNOSIS — Q8500 Neurofibromatosis, unspecified: Secondary | ICD-10-CM | POA: Diagnosis not present

## 2016-03-12 DIAGNOSIS — F419 Anxiety disorder, unspecified: Secondary | ICD-10-CM | POA: Diagnosis not present

## 2016-03-13 DIAGNOSIS — Z1389 Encounter for screening for other disorder: Secondary | ICD-10-CM | POA: Diagnosis not present

## 2016-03-13 DIAGNOSIS — E876 Hypokalemia: Secondary | ICD-10-CM | POA: Diagnosis not present

## 2016-03-13 DIAGNOSIS — D510 Vitamin B12 deficiency anemia due to intrinsic factor deficiency: Secondary | ICD-10-CM | POA: Diagnosis not present

## 2016-03-13 DIAGNOSIS — F419 Anxiety disorder, unspecified: Secondary | ICD-10-CM | POA: Diagnosis not present

## 2016-03-13 DIAGNOSIS — Q8500 Neurofibromatosis, unspecified: Secondary | ICD-10-CM | POA: Diagnosis not present

## 2016-03-13 DIAGNOSIS — E663 Overweight: Secondary | ICD-10-CM | POA: Diagnosis not present

## 2016-03-13 DIAGNOSIS — K529 Noninfective gastroenteritis and colitis, unspecified: Secondary | ICD-10-CM | POA: Diagnosis not present

## 2016-03-13 DIAGNOSIS — Z6828 Body mass index (BMI) 28.0-28.9, adult: Secondary | ICD-10-CM | POA: Diagnosis not present

## 2016-04-10 DIAGNOSIS — I10 Essential (primary) hypertension: Secondary | ICD-10-CM | POA: Diagnosis not present

## 2016-04-10 DIAGNOSIS — F339 Major depressive disorder, recurrent, unspecified: Secondary | ICD-10-CM | POA: Diagnosis not present

## 2016-04-10 DIAGNOSIS — Z79891 Long term (current) use of opiate analgesic: Secondary | ICD-10-CM | POA: Diagnosis not present

## 2016-04-10 DIAGNOSIS — M545 Low back pain: Secondary | ICD-10-CM | POA: Diagnosis not present

## 2016-04-10 DIAGNOSIS — M461 Sacroiliitis, not elsewhere classified: Secondary | ICD-10-CM | POA: Diagnosis not present

## 2016-04-10 DIAGNOSIS — K5903 Drug induced constipation: Secondary | ICD-10-CM | POA: Diagnosis not present

## 2016-04-10 DIAGNOSIS — E785 Hyperlipidemia, unspecified: Secondary | ICD-10-CM | POA: Diagnosis not present

## 2016-04-10 DIAGNOSIS — G4709 Other insomnia: Secondary | ICD-10-CM | POA: Diagnosis not present

## 2016-04-10 DIAGNOSIS — F419 Anxiety disorder, unspecified: Secondary | ICD-10-CM | POA: Diagnosis not present

## 2016-04-10 DIAGNOSIS — G4459 Other complicated headache syndrome: Secondary | ICD-10-CM | POA: Diagnosis not present

## 2016-04-10 DIAGNOSIS — Q8500 Neurofibromatosis, unspecified: Secondary | ICD-10-CM | POA: Diagnosis not present

## 2016-04-10 DIAGNOSIS — D329 Benign neoplasm of meninges, unspecified: Secondary | ICD-10-CM | POA: Diagnosis not present

## 2016-04-23 ENCOUNTER — Ambulatory Visit: Payer: Medicare Other | Admitting: "Endocrinology

## 2016-05-01 DIAGNOSIS — E782 Mixed hyperlipidemia: Secondary | ICD-10-CM | POA: Diagnosis not present

## 2016-05-01 DIAGNOSIS — M545 Low back pain: Secondary | ICD-10-CM | POA: Diagnosis not present

## 2016-05-01 DIAGNOSIS — Z6827 Body mass index (BMI) 27.0-27.9, adult: Secondary | ICD-10-CM | POA: Diagnosis not present

## 2016-05-01 DIAGNOSIS — Z1389 Encounter for screening for other disorder: Secondary | ICD-10-CM | POA: Diagnosis not present

## 2016-05-01 DIAGNOSIS — E559 Vitamin D deficiency, unspecified: Secondary | ICD-10-CM | POA: Diagnosis not present

## 2016-05-01 DIAGNOSIS — I10 Essential (primary) hypertension: Secondary | ICD-10-CM | POA: Diagnosis not present

## 2016-05-01 DIAGNOSIS — D510 Vitamin B12 deficiency anemia due to intrinsic factor deficiency: Secondary | ICD-10-CM | POA: Diagnosis not present

## 2016-05-01 DIAGNOSIS — E538 Deficiency of other specified B group vitamins: Secondary | ICD-10-CM | POA: Diagnosis not present

## 2016-05-01 DIAGNOSIS — M5410 Radiculopathy, site unspecified: Secondary | ICD-10-CM | POA: Diagnosis not present

## 2016-05-13 ENCOUNTER — Ambulatory Visit: Payer: Medicare Other | Admitting: "Endocrinology

## 2016-06-13 DIAGNOSIS — I10 Essential (primary) hypertension: Secondary | ICD-10-CM | POA: Diagnosis not present

## 2016-06-13 DIAGNOSIS — M545 Low back pain: Secondary | ICD-10-CM | POA: Diagnosis not present

## 2016-06-13 DIAGNOSIS — D329 Benign neoplasm of meninges, unspecified: Secondary | ICD-10-CM | POA: Diagnosis not present

## 2016-06-13 DIAGNOSIS — Q8500 Neurofibromatosis, unspecified: Secondary | ICD-10-CM | POA: Diagnosis not present

## 2016-06-13 DIAGNOSIS — G4709 Other insomnia: Secondary | ICD-10-CM | POA: Diagnosis not present

## 2016-06-13 DIAGNOSIS — Z79891 Long term (current) use of opiate analgesic: Secondary | ICD-10-CM | POA: Diagnosis not present

## 2016-06-13 DIAGNOSIS — M461 Sacroiliitis, not elsewhere classified: Secondary | ICD-10-CM | POA: Diagnosis not present

## 2016-06-13 DIAGNOSIS — E785 Hyperlipidemia, unspecified: Secondary | ICD-10-CM | POA: Diagnosis not present

## 2016-06-13 DIAGNOSIS — F419 Anxiety disorder, unspecified: Secondary | ICD-10-CM | POA: Diagnosis not present

## 2016-06-13 DIAGNOSIS — K5903 Drug induced constipation: Secondary | ICD-10-CM | POA: Diagnosis not present

## 2016-06-13 DIAGNOSIS — F339 Major depressive disorder, recurrent, unspecified: Secondary | ICD-10-CM | POA: Diagnosis not present

## 2016-06-13 DIAGNOSIS — G4459 Other complicated headache syndrome: Secondary | ICD-10-CM | POA: Diagnosis not present

## 2016-06-26 ENCOUNTER — Ambulatory Visit: Payer: Medicare Other | Admitting: "Endocrinology

## 2016-06-26 ENCOUNTER — Encounter: Payer: Self-pay | Admitting: "Endocrinology

## 2016-07-10 DIAGNOSIS — E538 Deficiency of other specified B group vitamins: Secondary | ICD-10-CM | POA: Diagnosis not present

## 2016-07-10 DIAGNOSIS — F419 Anxiety disorder, unspecified: Secondary | ICD-10-CM | POA: Diagnosis not present

## 2016-07-10 DIAGNOSIS — Z6828 Body mass index (BMI) 28.0-28.9, adult: Secondary | ICD-10-CM | POA: Diagnosis not present

## 2016-07-10 DIAGNOSIS — E782 Mixed hyperlipidemia: Secondary | ICD-10-CM | POA: Diagnosis not present

## 2016-07-10 DIAGNOSIS — Q8500 Neurofibromatosis, unspecified: Secondary | ICD-10-CM | POA: Diagnosis not present

## 2016-08-11 DIAGNOSIS — G4709 Other insomnia: Secondary | ICD-10-CM | POA: Diagnosis not present

## 2016-08-11 DIAGNOSIS — K5903 Drug induced constipation: Secondary | ICD-10-CM | POA: Diagnosis not present

## 2016-08-11 DIAGNOSIS — F419 Anxiety disorder, unspecified: Secondary | ICD-10-CM | POA: Diagnosis not present

## 2016-08-11 DIAGNOSIS — M545 Low back pain: Secondary | ICD-10-CM | POA: Diagnosis not present

## 2016-08-11 DIAGNOSIS — D329 Benign neoplasm of meninges, unspecified: Secondary | ICD-10-CM | POA: Diagnosis not present

## 2016-08-11 DIAGNOSIS — Q8500 Neurofibromatosis, unspecified: Secondary | ICD-10-CM | POA: Diagnosis not present

## 2016-08-11 DIAGNOSIS — M461 Sacroiliitis, not elsewhere classified: Secondary | ICD-10-CM | POA: Diagnosis not present

## 2016-08-11 DIAGNOSIS — Z79891 Long term (current) use of opiate analgesic: Secondary | ICD-10-CM | POA: Diagnosis not present

## 2016-08-11 DIAGNOSIS — I10 Essential (primary) hypertension: Secondary | ICD-10-CM | POA: Diagnosis not present

## 2016-08-11 DIAGNOSIS — G4459 Other complicated headache syndrome: Secondary | ICD-10-CM | POA: Diagnosis not present

## 2016-08-11 DIAGNOSIS — E785 Hyperlipidemia, unspecified: Secondary | ICD-10-CM | POA: Diagnosis not present

## 2016-08-11 DIAGNOSIS — F339 Major depressive disorder, recurrent, unspecified: Secondary | ICD-10-CM | POA: Diagnosis not present

## 2016-08-23 ENCOUNTER — Emergency Department (HOSPITAL_COMMUNITY)
Admission: EM | Admit: 2016-08-23 | Discharge: 2016-08-23 | Disposition: A | Discharge status: Home Health | Payer: Medicare Other | Attending: Emergency Medicine | Admitting: Emergency Medicine

## 2016-08-23 ENCOUNTER — Encounter (HOSPITAL_COMMUNITY): Payer: Self-pay | Admitting: Emergency Medicine

## 2016-08-23 DIAGNOSIS — R1033 Periumbilical pain: Secondary | ICD-10-CM | POA: Diagnosis present

## 2016-08-23 DIAGNOSIS — Z79899 Other long term (current) drug therapy: Secondary | ICD-10-CM | POA: Insufficient documentation

## 2016-08-23 DIAGNOSIS — R51 Headache: Secondary | ICD-10-CM | POA: Diagnosis not present

## 2016-08-23 DIAGNOSIS — I10 Essential (primary) hypertension: Secondary | ICD-10-CM | POA: Diagnosis not present

## 2016-08-23 DIAGNOSIS — R17 Unspecified jaundice: Secondary | ICD-10-CM

## 2016-08-23 DIAGNOSIS — N390 Urinary tract infection, site not specified: Secondary | ICD-10-CM | POA: Diagnosis not present

## 2016-08-23 DIAGNOSIS — Z87891 Personal history of nicotine dependence: Secondary | ICD-10-CM | POA: Insufficient documentation

## 2016-08-23 HISTORY — DX: Benign neoplasm of brain, unspecified: D33.2

## 2016-08-23 LAB — COMPREHENSIVE METABOLIC PANEL
ALK PHOS: 92 U/L (ref 38–126)
ALT: 28 U/L (ref 14–54)
AST: 28 U/L (ref 15–41)
Albumin: 4.1 g/dL (ref 3.5–5.0)
Anion gap: 9 (ref 5–15)
BILIRUBIN TOTAL: 2.1 mg/dL — AB (ref 0.3–1.2)
BUN: 8 mg/dL (ref 6–20)
CALCIUM: 10.2 mg/dL (ref 8.9–10.3)
CO2: 25 mmol/L (ref 22–32)
CREATININE: 0.83 mg/dL (ref 0.44–1.00)
Chloride: 99 mmol/L — ABNORMAL LOW (ref 101–111)
Glucose, Bld: 104 mg/dL — ABNORMAL HIGH (ref 65–99)
Potassium: 3.1 mmol/L — ABNORMAL LOW (ref 3.5–5.1)
SODIUM: 133 mmol/L — AB (ref 135–145)
Total Protein: 7 g/dL (ref 6.5–8.1)

## 2016-08-23 LAB — MAGNESIUM: Magnesium: 1.6 mg/dL — ABNORMAL LOW (ref 1.7–2.4)

## 2016-08-23 LAB — CBC
HCT: 37.3 % (ref 36.0–46.0)
Hemoglobin: 13 g/dL (ref 12.0–15.0)
MCH: 30.7 pg (ref 26.0–34.0)
MCHC: 34.9 g/dL (ref 30.0–36.0)
MCV: 88.2 fL (ref 78.0–100.0)
PLATELETS: 194 10*3/uL (ref 150–400)
RBC: 4.23 MIL/uL (ref 3.87–5.11)
RDW: 13 % (ref 11.5–15.5)
WBC: 8 10*3/uL (ref 4.0–10.5)

## 2016-08-23 LAB — LIPASE, BLOOD: Lipase: 24 U/L (ref 11–51)

## 2016-08-23 LAB — URINALYSIS, ROUTINE W REFLEX MICROSCOPIC
Bilirubin Urine: NEGATIVE
GLUCOSE, UA: NEGATIVE mg/dL
HGB URINE DIPSTICK: NEGATIVE
Ketones, ur: NEGATIVE mg/dL
Nitrite: POSITIVE — AB
Protein, ur: NEGATIVE mg/dL
SPECIFIC GRAVITY, URINE: 1.008 (ref 1.005–1.030)
pH: 8 (ref 5.0–8.0)

## 2016-08-23 MED ORDER — GI COCKTAIL ~~LOC~~
30.0000 mL | Freq: Once | ORAL | Status: AC
  Administered 2016-08-23: 30 mL via ORAL

## 2016-08-23 MED ORDER — MAGNESIUM OXIDE 400 (241.3 MG) MG PO TABS
400.0000 mg | ORAL_TABLET | Freq: Once | ORAL | Status: AC
  Administered 2016-08-23: 400 mg via ORAL
  Filled 2016-08-23: qty 1

## 2016-08-23 MED ORDER — CEPHALEXIN 500 MG PO CAPS: 500.0000 mg | ORAL_CAPSULE | Freq: Two times a day (BID) | ORAL | 0 refills | Status: AC

## 2016-08-23 MED ORDER — POTASSIUM CHLORIDE CRYS ER 20 MEQ PO TBCR
40.0000 meq | EXTENDED_RELEASE_TABLET | Freq: Once | ORAL | Status: AC
  Administered 2016-08-23: 40 meq via ORAL
  Filled 2016-08-23: qty 2

## 2016-08-23 MED ORDER — GI COCKTAIL ~~LOC~~
ORAL | Status: AC
  Filled 2016-08-23: qty 30

## 2016-08-23 NOTE — ED Triage Notes (Signed)
Patient c/o mid abd pain that radiates to the back. Denies any nausea, vomiting, or diarrhea. Unsure of any fevers. Patient does reports some pain with urination. Patient also c/o headache. Patient reports taking advil and Topamax with no relief. HX of migraines due to brain tumor.

## 2016-08-23 NOTE — ED Provider Notes (Signed)
[facility] DEPT Provider Note   CSN: [id number] Arrival date & time: 08/23/16  0718     History   Chief Complaint Chief Complaint  Patient presents with  . Abdominal Pain    HPI Julia [name] is a 56 y.o. female.  HPI   56 year old female with PMH of anxiety, chronic back pain, HTN, headaches and meningioma, functional constipation who presents for abdominal pain x1 day. Pain generalized but located mostly in periumbilical and epigastric region. Pain does not radiate but she is currently experiencing her typical chronic back pain and headache. She has tried Tylenol for abdominal pain without relief. Reports that she had last BM yesterday which was normal. She takes a prn medication for constipation and last needed it last week. She reports that she had similar abdominal pain several years ago but is not able to remember what was the cause of that pain.   Patient denies sick contacts. No changes in diet. Has been tolerating good PO intake. Pertinent negatives include lack of nausea, vomiting, diarrhea, fevers, hematochezia. Does endorse some urinary frequency but denies dysuria and urgency. No vaginal discharge or concern for STDs.   Past Medical History:  Diagnosis Date  . Anxiety   . Brain tumor (benign) ([place])   . Chronic pain   . Elevated cholesterol   . Headache(784.0)   . Hypertension   . Numerous moles   . Trichimoniasis     Patient Active Problem List   Diagnosis Date Noted  . Constipation - functional 09/30/2012  . Colon cancer screening 09/30/2012  . Trichomoniasis of vagina 08/31/2012  . Pelvic relaxation due to cystocele, midline 06/02/2012  . Rectocele 06/02/2012    Past Surgical History:  Procedure Laterality Date  . TUBAL LIGATION      OB History    Gravida Para Term Preterm AB Living   '3 3       3   ' SAB TAB Ectopic Multiple Live Births                   Home Medications    Prior to Admission medications   Medication Sig Start Date End  Date Taking? Authorizing Provider  ALPRAZolam Duanne Moron) 1 MG tablet Take 1 mg by mouth 4 (four) times daily as needed for anxiety.  05/17/12  Yes [provider]  amitriptyline (ELAVIL) 25 MG tablet Take 25 mg by mouth at bedtime.  05/22/12  Yes [provider]  dicyclomine (BENTYL) 20 MG tablet Take 1 tablet (20 mg total) by mouth 3 (three) times daily before meals. As needed for abdominal cramping 03/10/16  Yes Ward, Delice Bison, DO  ezetimibe (ZETIA) 10 MG tablet Take 10 mg by mouth daily.  08/15/16  Yes [provider]  ferrous sulfate 325 (65 FE) MG tablet Take 325 mg by mouth at bedtime.   Yes [provider]  hydrochlorothiazide (HYDRODIURIL) 12.5 MG tablet Take 1 tablet by mouth daily. 08/02/15  Yes [provider]  Linaclotide (LINZESS) 145 MCG CAPS 1 PO 30 mins prior to your first meal Patient taking differently: 145 mcg. 1 by mouth 30 mins prior to your first meal 09/30/12  Yes Fields, Marga Melnick, MD  loperamide (IMODIUM) 2 MG capsule Take 1 capsule (2 mg total) by mouth 4 (four) times daily as needed for diarrhea or loose stools. 03/10/16  Yes Ward, Cyril Mourning N, DO  losartan (COZAAR) 50 MG tablet 50 mg daily.  09/01/13  Yes [provider]  ondansetron [facility]  ODT) 4 MG disintegrating tablet Take 1 tablet (4 mg total) by mouth every 8 (eight) hours as needed for nausea or vomiting. 03/10/16  Yes Ward, Delice Bison, DO  oxyCODONE-acetaminophen (PERCOCET/ROXICET) 5-325 MG tablet Take 1 tablet by mouth 4 (four) times daily. 08/07/15  Yes [provider]  PARoxetine (PAXIL) 20 MG tablet Take 20 mg by mouth at bedtime. 05/22/12  Yes [provider]  potassium chloride (K-DUR) 10 MEQ tablet Take 1 tablet (10 mEq total) by mouth 2 (two) times daily. 03/10/16  Yes Ward, Cyril Mourning N, DO  pravastatin (PRAVACHOL) 40 MG tablet Take 40 mg by mouth daily. 05/20/12  Yes [provider]  topiramate (TOPAMAX) 100 MG tablet Take 100 mg by mouth daily.   06/16/16  Yes [provider]  cephALEXin (KEFLEX) 500 MG capsule Take 1 capsule (500 mg total) by mouth 2 (two) times daily. 08/23/16 09/02/16  Nicolette Bang, DO    Family History Family History  Problem Relation Age of Onset  . Stroke Mother   . Heart failure Mother   . Cancer Father        lung  . Diabetes Brother     Social History Social History  Substance Use Topics  . Smoking status: Former Smoker    Types: Cigarettes  . Smokeless tobacco: Never Used  . Alcohol use No     Allergies   Naprosyn [naproxen]; Penicillins; and Promethazine hcl   Review of Systems Review of Systems  Constitutional: Negative for appetite change, chills and fever.  Cardiovascular: Negative for chest pain.  Gastrointestinal: Positive for abdominal pain. Negative for abdominal distention, anal bleeding, blood in stool, constipation, diarrhea, nausea and vomiting.  Genitourinary: Positive for frequency. Negative for dysuria, urgency and vaginal discharge.  Musculoskeletal: Positive for back pain.  Neurological: Positive for headaches.     Physical Exam Updated Vital Signs BP 123/80   Pulse 82   Temp 99.1 F (37.3 C) (Oral)   Resp 20   Ht 5' (1.524 m)   Wt 64 kg (141 lb)   LMP 09/06/2012   SpO2 100%   BMI 27.54 kg/m   Physical Exam  Constitutional: She is oriented to person, place, and time. She appears well-developed and well-nourished. No distress.  HENT:  Head: Normocephalic and atraumatic.  Mouth/Throat: Oropharynx is clear and moist.  Eyes: Conjunctivae and EOM are normal. Pupils are equal, round, and reactive to light.  Neck: Normal range of motion. Neck supple.  Cardiovascular: Normal rate, regular rhythm and normal heart sounds.   No murmur heard. Pulmonary/Chest: Effort normal and breath sounds normal. No respiratory distress. She has no wheezes.  Abdominal: Soft. Bowel sounds are normal. She exhibits no distension. There is no tenderness. There is no  rebound and no guarding.  Neurological: She is alert and oriented to person, place, and time.  Skin: Skin is warm and dry.     ED Treatments / Results  Labs (all labs ordered are listed, but only abnormal results are displayed) Labs Reviewed  COMPREHENSIVE METABOLIC PANEL - Abnormal; Notable for the following:       Result Value   Sodium 133 (*)    Potassium 3.1 (*)    Chloride 99 (*)    Glucose, Bld 104 (*)    Total Bilirubin 2.1 (*)    All other components within normal limits  URINALYSIS, ROUTINE W REFLEX MICROSCOPIC - Abnormal; Notable for the following:    APPearance HAZY (*)    Nitrite POSITIVE (*)  Leukocytes, UA MODERATE (*)    Bacteria, UA FEW (*)    Squamous Epithelial / LPF 0-5 (*)    All other components within normal limits  MAGNESIUM - Abnormal; Notable for the following:    Magnesium 1.6 (*)    All other components within normal limits  LIPASE, BLOOD  CBC    EKG  EKG Interpretation None       Radiology No results found.  Procedures Procedures (including critical care time)  Medications Ordered in ED Medications  gi cocktail suspension (not administered)  magnesium oxide (MAG-OX) tablet 400 mg (not administered)  gi cocktail (Maalox,Lidocaine,Donnatal) (30 mLs Oral Given 08/23/16 0834)  potassium chloride SA (K-DUR,KLOR-CON) CR tablet 40 mEq (40 mEq Oral Given 08/23/16 0858)     Initial Impression / Assessment and Plan / ED Course  I have reviewed the triage vital signs and the nursing notes.  Pertinent labs & imaging results that were available during my care of the patient were reviewed by me and considered in my medical decision making (see chart for details).     56 year old female presenting with abdominal pain. Afebrile with stable vital signs. Abdominal exam benign. Doubt emergent process such as appendicitis, mesenteric ischemia. Doubt obstruction given that patient reports she has continued to have bowel movements. CBC without  leukocytosis or anemia. CMET showed mildly low K at 3.1; repletion done orally. Magnesium 1.6; repletion done PO. Lipase WNL. GI cocktail given with some improvement. UA showed signs of infection with positive nitrites, moderate leukocytes, TNTC WBCs, and few bacteria. Will treat as uncomplicated UTI with 7 day course of Keflex. Return precautions including fever, nausea, vomiting, and change in back pain discussed.   Of note, bilirubin noted to be elevated to 2.1. Has previously been elevated in the recent past. Patient's hemoglobin very normal so doubt hemolysis. LFTs and Alk Phos WNL so less likely hepatic origin. Would recommend follow up with PCP.   Final Clinical Impressions(s) / ED Diagnoses   Final diagnoses:  Lower urinary tract infectious disease  Total bilirubin, elevated    New Prescriptions New Prescriptions   CEPHALEXIN (KEFLEX) 500 MG CAPSULE    Take 1 capsule (500 mg total) by mouth 2 (two) times daily.     Nicolette Bang, DO 08/23/16 1000    Elnora Morrison, MD 08/23/16 [phone]

## 2016-08-26 DIAGNOSIS — I10 Essential (primary) hypertension: Secondary | ICD-10-CM | POA: Diagnosis not present

## 2016-08-26 DIAGNOSIS — E559 Vitamin D deficiency, unspecified: Secondary | ICD-10-CM | POA: Diagnosis not present

## 2016-08-26 DIAGNOSIS — E663 Overweight: Secondary | ICD-10-CM | POA: Diagnosis not present

## 2016-08-26 DIAGNOSIS — E538 Deficiency of other specified B group vitamins: Secondary | ICD-10-CM | POA: Diagnosis not present

## 2016-08-26 DIAGNOSIS — Z0000 Encounter for general adult medical examination without abnormal findings: Secondary | ICD-10-CM | POA: Diagnosis not present

## 2016-08-26 DIAGNOSIS — E782 Mixed hyperlipidemia: Secondary | ICD-10-CM | POA: Diagnosis not present

## 2016-08-26 DIAGNOSIS — N390 Urinary tract infection, site not specified: Secondary | ICD-10-CM | POA: Diagnosis not present

## 2016-08-26 DIAGNOSIS — N3000 Acute cystitis without hematuria: Secondary | ICD-10-CM | POA: Diagnosis not present

## 2016-08-26 DIAGNOSIS — Z6827 Body mass index (BMI) 27.0-27.9, adult: Secondary | ICD-10-CM | POA: Diagnosis not present

## 2016-09-26 ENCOUNTER — Other Ambulatory Visit (HOSPITAL_COMMUNITY): Payer: Self-pay | Admitting: Internal Medicine

## 2016-09-26 DIAGNOSIS — Z1231 Encounter for screening mammogram for malignant neoplasm of breast: Secondary | ICD-10-CM

## 2016-10-15 ENCOUNTER — Ambulatory Visit (HOSPITAL_COMMUNITY): Payer: Self-pay

## 2016-10-20 DIAGNOSIS — E785 Hyperlipidemia, unspecified: Secondary | ICD-10-CM | POA: Diagnosis not present

## 2016-10-20 DIAGNOSIS — Z79891 Long term (current) use of opiate analgesic: Secondary | ICD-10-CM | POA: Diagnosis not present

## 2016-10-20 DIAGNOSIS — G4709 Other insomnia: Secondary | ICD-10-CM | POA: Diagnosis not present

## 2016-10-20 DIAGNOSIS — Q8500 Neurofibromatosis, unspecified: Secondary | ICD-10-CM | POA: Diagnosis not present

## 2016-10-20 DIAGNOSIS — F339 Major depressive disorder, recurrent, unspecified: Secondary | ICD-10-CM | POA: Diagnosis not present

## 2016-10-20 DIAGNOSIS — K5903 Drug induced constipation: Secondary | ICD-10-CM | POA: Diagnosis not present

## 2016-10-20 DIAGNOSIS — I10 Essential (primary) hypertension: Secondary | ICD-10-CM | POA: Diagnosis not present

## 2016-10-20 DIAGNOSIS — D329 Benign neoplasm of meninges, unspecified: Secondary | ICD-10-CM | POA: Diagnosis not present

## 2016-10-20 DIAGNOSIS — M545 Low back pain: Secondary | ICD-10-CM | POA: Diagnosis not present

## 2016-10-20 DIAGNOSIS — M461 Sacroiliitis, not elsewhere classified: Secondary | ICD-10-CM | POA: Diagnosis not present

## 2016-10-20 DIAGNOSIS — G4459 Other complicated headache syndrome: Secondary | ICD-10-CM | POA: Diagnosis not present

## 2016-10-20 DIAGNOSIS — F419 Anxiety disorder, unspecified: Secondary | ICD-10-CM | POA: Diagnosis not present

## 2016-11-28 ENCOUNTER — Ambulatory Visit (HOSPITAL_COMMUNITY): Payer: Self-pay

## 2016-12-12 DIAGNOSIS — Q8500 Neurofibromatosis, unspecified: Secondary | ICD-10-CM | POA: Diagnosis not present

## 2016-12-12 DIAGNOSIS — M545 Low back pain: Secondary | ICD-10-CM | POA: Diagnosis not present

## 2016-12-12 DIAGNOSIS — D510 Vitamin B12 deficiency anemia due to intrinsic factor deficiency: Secondary | ICD-10-CM | POA: Diagnosis not present

## 2016-12-12 DIAGNOSIS — Z1389 Encounter for screening for other disorder: Secondary | ICD-10-CM | POA: Diagnosis not present

## 2016-12-12 DIAGNOSIS — E210 Primary hyperparathyroidism: Secondary | ICD-10-CM | POA: Diagnosis not present

## 2016-12-12 DIAGNOSIS — I10 Essential (primary) hypertension: Secondary | ICD-10-CM | POA: Diagnosis not present

## 2016-12-12 DIAGNOSIS — F419 Anxiety disorder, unspecified: Secondary | ICD-10-CM | POA: Diagnosis not present

## 2016-12-12 DIAGNOSIS — Z6828 Body mass index (BMI) 28.0-28.9, adult: Secondary | ICD-10-CM | POA: Diagnosis not present

## 2016-12-19 ENCOUNTER — Ambulatory Visit (HOSPITAL_COMMUNITY): Payer: Self-pay

## 2016-12-24 DIAGNOSIS — Z79891 Long term (current) use of opiate analgesic: Secondary | ICD-10-CM | POA: Diagnosis not present

## 2016-12-24 DIAGNOSIS — F419 Anxiety disorder, unspecified: Secondary | ICD-10-CM | POA: Diagnosis not present

## 2016-12-24 DIAGNOSIS — M545 Low back pain: Secondary | ICD-10-CM | POA: Diagnosis not present

## 2016-12-24 DIAGNOSIS — G4459 Other complicated headache syndrome: Secondary | ICD-10-CM | POA: Diagnosis not present

## 2016-12-25 ENCOUNTER — Other Ambulatory Visit: Payer: Medicare Other | Admitting: Adult Health

## 2016-12-25 ENCOUNTER — Encounter: Payer: Self-pay | Admitting: *Deleted

## 2017-01-26 ENCOUNTER — Ambulatory Visit: Payer: Medicare Other | Admitting: "Endocrinology

## 2017-02-23 DIAGNOSIS — G4459 Other complicated headache syndrome: Secondary | ICD-10-CM | POA: Diagnosis not present

## 2017-02-23 DIAGNOSIS — Q8500 Neurofibromatosis, unspecified: Secondary | ICD-10-CM | POA: Diagnosis not present

## 2017-02-23 DIAGNOSIS — M545 Low back pain: Secondary | ICD-10-CM | POA: Diagnosis not present

## 2017-02-23 DIAGNOSIS — Z79891 Long term (current) use of opiate analgesic: Secondary | ICD-10-CM | POA: Diagnosis not present

## 2017-02-23 DIAGNOSIS — G894 Chronic pain syndrome: Secondary | ICD-10-CM | POA: Diagnosis not present

## 2017-03-05 DIAGNOSIS — M5136 Other intervertebral disc degeneration, lumbar region: Secondary | ICD-10-CM | POA: Diagnosis not present

## 2017-03-05 DIAGNOSIS — Z6828 Body mass index (BMI) 28.0-28.9, adult: Secondary | ICD-10-CM | POA: Diagnosis not present

## 2017-03-05 DIAGNOSIS — K529 Noninfective gastroenteritis and colitis, unspecified: Secondary | ICD-10-CM | POA: Diagnosis not present

## 2017-03-05 DIAGNOSIS — S39012A Strain of muscle, fascia and tendon of lower back, initial encounter: Secondary | ICD-10-CM | POA: Diagnosis not present

## 2017-03-05 DIAGNOSIS — E663 Overweight: Secondary | ICD-10-CM | POA: Diagnosis not present

## 2017-03-05 DIAGNOSIS — Z23 Encounter for immunization: Secondary | ICD-10-CM | POA: Diagnosis not present

## 2017-03-05 DIAGNOSIS — F419 Anxiety disorder, unspecified: Secondary | ICD-10-CM | POA: Diagnosis not present

## 2017-03-05 DIAGNOSIS — E538 Deficiency of other specified B group vitamins: Secondary | ICD-10-CM | POA: Diagnosis not present

## 2017-03-13 ENCOUNTER — Ambulatory Visit (HOSPITAL_COMMUNITY): Payer: Self-pay

## 2017-03-26 DIAGNOSIS — Z6828 Body mass index (BMI) 28.0-28.9, adult: Secondary | ICD-10-CM | POA: Diagnosis not present

## 2017-03-26 DIAGNOSIS — Z1389 Encounter for screening for other disorder: Secondary | ICD-10-CM | POA: Diagnosis not present

## 2017-03-26 DIAGNOSIS — T148XXA Other injury of unspecified body region, initial encounter: Secondary | ICD-10-CM | POA: Diagnosis not present

## 2017-03-26 DIAGNOSIS — E663 Overweight: Secondary | ICD-10-CM | POA: Diagnosis not present

## 2017-03-26 DIAGNOSIS — S300XXA Contusion of lower back and pelvis, initial encounter: Secondary | ICD-10-CM | POA: Diagnosis not present

## 2017-04-01 ENCOUNTER — Other Ambulatory Visit: Payer: Medicare Other | Admitting: Adult Health

## 2017-04-01 ENCOUNTER — Encounter: Payer: Self-pay | Admitting: *Deleted

## 2017-04-23 DIAGNOSIS — Q8500 Neurofibromatosis, unspecified: Secondary | ICD-10-CM | POA: Diagnosis not present

## 2017-04-23 DIAGNOSIS — M545 Low back pain: Secondary | ICD-10-CM | POA: Diagnosis not present

## 2017-04-23 DIAGNOSIS — M542 Cervicalgia: Secondary | ICD-10-CM | POA: Diagnosis not present

## 2017-04-24 ENCOUNTER — Other Ambulatory Visit (HOSPITAL_COMMUNITY): Payer: Self-pay | Admitting: Internal Medicine

## 2017-04-24 DIAGNOSIS — Z1231 Encounter for screening mammogram for malignant neoplasm of breast: Secondary | ICD-10-CM

## 2017-04-30 ENCOUNTER — Ambulatory Visit (HOSPITAL_COMMUNITY): Payer: Self-pay

## 2017-05-07 ENCOUNTER — Ambulatory Visit (HOSPITAL_COMMUNITY)
Admission: RE | Admit: 2017-05-07 | Discharge: 2017-05-07 | Disposition: A | Discharge status: Home / Self Care | Payer: Medicare Other | Source: Ambulatory Visit | Attending: Internal Medicine | Admitting: Internal Medicine

## 2017-05-07 DIAGNOSIS — Z1231 Encounter for screening mammogram for malignant neoplasm of breast: Secondary | ICD-10-CM | POA: Insufficient documentation

## 2017-05-27 DIAGNOSIS — M542 Cervicalgia: Secondary | ICD-10-CM | POA: Diagnosis not present

## 2017-05-27 DIAGNOSIS — Q8500 Neurofibromatosis, unspecified: Secondary | ICD-10-CM | POA: Diagnosis not present

## 2017-05-27 DIAGNOSIS — G4459 Other complicated headache syndrome: Secondary | ICD-10-CM | POA: Diagnosis not present

## 2017-05-27 DIAGNOSIS — Z79891 Long term (current) use of opiate analgesic: Secondary | ICD-10-CM | POA: Diagnosis not present

## 2017-05-27 DIAGNOSIS — M545 Low back pain: Secondary | ICD-10-CM | POA: Diagnosis not present

## 2017-05-29 ENCOUNTER — Telehealth: Payer: Self-pay | Admitting: *Deleted

## 2017-05-29 NOTE — Telephone Encounter (Signed)
Spoke with pt. Pt has been bleeding and having back and side pain. Pt hasn't had a period in a while. Has appt 4/2 for physical. I advised we can put her on a cancellation list for next week. Advised to take Tylenol or Advil for discomfort. Pt voiced understanding. [place]

## 2017-06-09 ENCOUNTER — Other Ambulatory Visit: Payer: Self-pay

## 2017-06-09 ENCOUNTER — Ambulatory Visit (INDEPENDENT_AMBULATORY_CARE_PROVIDER_SITE_OTHER): Payer: Medicare Other | Admitting: Adult Health

## 2017-06-09 ENCOUNTER — Encounter: Payer: Self-pay | Admitting: Adult Health

## 2017-06-09 ENCOUNTER — Other Ambulatory Visit (HOSPITAL_COMMUNITY)
Admission: RE | Admit: 2017-06-09 | Discharge: 2017-06-09 | Disposition: A | Discharge status: Home / Self Care | Payer: Medicare Other | Source: Ambulatory Visit | Attending: Adult Health | Admitting: Adult Health

## 2017-06-09 VITALS — BP 140/92 | HR 65 | Resp 20 | Ht 60.0 in | Wt 147.0 lb

## 2017-06-09 DIAGNOSIS — Z1211 Encounter for screening for malignant neoplasm of colon: Secondary | ICD-10-CM | POA: Diagnosis not present

## 2017-06-09 DIAGNOSIS — Z124 Encounter for screening for malignant neoplasm of cervix: Secondary | ICD-10-CM

## 2017-06-09 DIAGNOSIS — Z01419 Encounter for gynecological examination (general) (routine) without abnormal findings: Secondary | ICD-10-CM

## 2017-06-09 DIAGNOSIS — N950 Postmenopausal bleeding: Secondary | ICD-10-CM

## 2017-06-09 DIAGNOSIS — Z1151 Encounter for screening for human papillomavirus (HPV): Secondary | ICD-10-CM | POA: Insufficient documentation

## 2017-06-09 DIAGNOSIS — Z1212 Encounter for screening for malignant neoplasm of rectum: Secondary | ICD-10-CM | POA: Diagnosis not present

## 2017-06-09 LAB — HEMOCCULT GUIAC POC 1CARD (OFFICE): FECAL OCCULT BLD: NEGATIVE

## 2017-06-09 NOTE — Patient Instructions (Signed)
Postmenopausal Bleeding Postmenopausal bleeding is any bleeding after menopause. Menopause is when a woman's period stops. Any type of bleeding after menopause is concerning. It should be checked by your doctor. Any treatment will depend on the cause. Follow these instructions at home: Watch your condition for any changes.  Avoid the use of tampons and douches as told by your doctor.  Change your pads often.  Get regular pelvic exams and Pap tests.  Keep all appointments for tests as told by your doctor.  Contact a doctor if:  Your bleeding lasts for more than 1 week.  You have belly (abdominal) pain.  You have bleeding after sex (intercourse). Get help right away if:  You have a fever, chills, a headache, dizziness, muscle aches, and bleeding.  You have strong pain with bleeding.  You have clumps of blood (blood clots) coming from your vagina.  You have bleeding and need more than 1 pad an hour.  You feel like you are going to pass out (faint). This information is not intended to replace advice given to you by your health care provider. Make sure you discuss any questions you have with your health care provider. Document Released: 12/04/2007 Document Revised: 08/02/2015 Document Reviewed: 09/23/2012 Elsevier Interactive Patient Education  2017 [employer]  

## 2017-06-09 NOTE — Progress Notes (Signed)
Patient ID: Julia [name], female   DOB: 1960-08-05, 57 y.o.   MRN: [id number] History of Present Illness:  Julia [name] is a 57 year old white female,single in for a well woman gyn exam and pap.+vaginal bleeding last month for about 2 days PCP is Dr Gerarda Fraction.   Current Medications, Allergies, Past Medical History, Past Surgical History, Family History and Social History were reviewed in [facility] record.     Review of Systems:  Patient denies any headaches, hearing loss, fatigue, blurred vision, shortness of breath, chest pain, abdominal pain, problems with bowel movements, urination, or intercourse(not having sex). No joint pain or mood swings. +vaginal bleeding last month  Physical Exam:BP (!) 140/92 (BP Location: Right Arm, Patient Position: Sitting, Cuff Size: Normal)   Pulse 65   Resp 20   Ht 5' (1.524 m)   Wt 147 lb (66.7 kg)   LMP 09/06/2012   BMI 28.71 kg/m  General:  Well developed, well nourished, no acute distress Skin:  Warm and dry,lots of skin tags  Neck:  Midline trachea, normal thyroid, good ROM, no lymphadenopathy Lungs; Clear to auscultation bilaterally Breast:  No dominant palpable mass, retraction, or nipple discharge Cardiovascular: Regular rate and rhythm Abdomen:  Soft, non tender, no hepatosplenomegaly Pelvic:  External genitalia is normal in appearance, no lesions.  The vagina is normal in appearance. Urethra has no lesions or masses. The cervix is smooth, pap with HPV performed.  Uterus is felt to be normal size, shape, and contour.  No adnexal masses or tenderness noted.Bladder is non tender, no masses felt. Rectal: Good sphincter tone, no polyps, or hemorrhoids felt.  Hemoccult negative. Extremities/musculoskeletal:  No swelling or varicosities noted, no clubbing or cyanosis Psych:  No mood changes, alert and cooperative,seems happy PHQ 2 score 1.  Impression: 1. Encounter for gynecological examination with Papanicolaou smear of  cervix   2. Screening for colorectal cancer   3. PMB (postmenopausal bleeding)       Plan: Return in in 1 week for GYN [place] and see me Physical and pap in 2 years Mammogram yearly Labs with PCP

## 2017-06-10 LAB — CYTOLOGY - PAP
Diagnosis: NEGATIVE
HPV: NOT DETECTED

## 2017-06-17 ENCOUNTER — Ambulatory Visit (INDEPENDENT_AMBULATORY_CARE_PROVIDER_SITE_OTHER): Payer: Medicare Other

## 2017-06-17 ENCOUNTER — Ambulatory Visit (INDEPENDENT_AMBULATORY_CARE_PROVIDER_SITE_OTHER): Payer: Medicare Other | Admitting: Adult Health

## 2017-06-17 ENCOUNTER — Encounter: Payer: Self-pay | Admitting: Adult Health

## 2017-06-17 VITALS — BP 152/98 | HR 68 | Ht 60.0 in | Wt 144.0 lb

## 2017-06-17 DIAGNOSIS — D251 Intramural leiomyoma of uterus: Secondary | ICD-10-CM

## 2017-06-17 DIAGNOSIS — N888 Other specified noninflammatory disorders of cervix uteri: Secondary | ICD-10-CM

## 2017-06-17 DIAGNOSIS — D250 Submucous leiomyoma of uterus: Secondary | ICD-10-CM | POA: Diagnosis not present

## 2017-06-17 DIAGNOSIS — N950 Postmenopausal bleeding: Secondary | ICD-10-CM

## 2017-06-17 DIAGNOSIS — R9389 Abnormal findings on diagnostic imaging of other specified body structures: Secondary | ICD-10-CM | POA: Diagnosis not present

## 2017-06-17 NOTE — Progress Notes (Addendum)
PELVIC US TA/TV: heterogeneous retroverted uterus w/mult.fibroids,(#1) subserosal posterior left fibroid 1.8 x 1.6 x 1.7 cm,(#2) intramural mid right 1 x  .9 x 1.1 cm,heterogeneous thickened endometrium w/color flow 16.9 mm,normal ovaries bilat,simple nabothian cyst 1.3 x .9 x .9 cm,no free fluid,no pain during ultrasound,ovaries appear mobile

## 2017-06-17 NOTE — Progress Notes (Signed)
Subjective:     Patient ID: Farris Has, female   DOB: 29-Jun-1960, 57 y.o.   MRN: [id number]  HPI Adelita is a 57 year old white female in for [place] to assess PMB.   Review of Systems  +recent PMB Reviewed past medical,surgical, social and family history. Reviewed medications and allergies.     Objective:   Physical Exam BP (!) 152/98 (BP Location: Right Arm, Patient Position: Sitting, Cuff Size: Normal)   Pulse 68   Ht 5' (1.524 m)   Wt 144 lb (65.3 kg)   LMP 09/06/2012   BMI 28.12 kg/m Talk only:US showed normal ovaries, a small fibroid and thickened endometrium of 16.9 mm and +color flow, will need endometrial biopsy to assess tissue.Discussed could be polyp or endometrial cancer, or even benign process, but biopsy will give the results.    Assessment:     1. Thickened endometrium   2. PMB (postmenopausal bleeding)       Plan:     Return in 1 week for endometrial biopsy with Dr Glo Herring Review handout on endometrial biopsy

## 2017-06-23 DIAGNOSIS — Q8500 Neurofibromatosis, unspecified: Secondary | ICD-10-CM | POA: Diagnosis not present

## 2017-06-23 DIAGNOSIS — E663 Overweight: Secondary | ICD-10-CM | POA: Diagnosis not present

## 2017-06-23 DIAGNOSIS — F419 Anxiety disorder, unspecified: Secondary | ICD-10-CM | POA: Diagnosis not present

## 2017-06-23 DIAGNOSIS — E538 Deficiency of other specified B group vitamins: Secondary | ICD-10-CM | POA: Diagnosis not present

## 2017-06-23 DIAGNOSIS — I10 Essential (primary) hypertension: Secondary | ICD-10-CM | POA: Diagnosis not present

## 2017-06-23 DIAGNOSIS — Z6828 Body mass index (BMI) 28.0-28.9, adult: Secondary | ICD-10-CM | POA: Diagnosis not present

## 2017-06-25 ENCOUNTER — Encounter: Payer: Self-pay | Admitting: Gastroenterology

## 2017-06-29 ENCOUNTER — Encounter: Payer: Self-pay | Admitting: Obstetrics and Gynecology

## 2017-06-29 ENCOUNTER — Ambulatory Visit (INDEPENDENT_AMBULATORY_CARE_PROVIDER_SITE_OTHER): Payer: Medicare Other | Admitting: Obstetrics and Gynecology

## 2017-06-29 ENCOUNTER — Other Ambulatory Visit: Payer: Self-pay | Admitting: Obstetrics and Gynecology

## 2017-06-29 VITALS — BP 150/86 | HR 80 | Ht 60.0 in | Wt 144.0 lb

## 2017-06-29 DIAGNOSIS — N950 Postmenopausal bleeding: Secondary | ICD-10-CM | POA: Diagnosis not present

## 2017-06-29 DIAGNOSIS — N858 Other specified noninflammatory disorders of uterus: Secondary | ICD-10-CM | POA: Diagnosis not present

## 2017-06-29 NOTE — Patient Instructions (Signed)
dilation Dilation and Curettage or Vacuum Curettage Dilation and curettage (D&C) and vacuum curettage are minor procedures. A D&C involves stretching (dilation) the cervix and scraping (curettage) the inside lining of the uterus (endometrium). During a D&C, tissue is gently scraped from the endometrium, starting from the top portion of the uterus down to the lowest part of the uterus (cervix). During a vacuum curettage, the lining and tissue in the uterus are removed with the use of gentle suction. Curettage may be performed to either diagnose or treat a problem. As a diagnostic procedure, curettage is performed to examine tissues from the uterus. A diagnostic curettage may be done if you have:  Irregular bleeding in the uterus.  Bleeding with the development of clots.  Spotting between menstrual periods.  Prolonged menstrual periods or other abnormal bleeding.  Bleeding after menopause.  No menstrual period (amenorrhea).  A change in size and shape of the uterus.  Abnormal endometrial cells discovered during a Pap test.  As a treatment procedure, curettage may be performed for the following reasons:  Removal of an IUD (intrauterine device).  Removal of retained placenta after giving birth.  Abortion.  Miscarriage.  Removal of endometrial polyps.  Removal of uncommon types of noncancerous lumps (fibroids).  Tell a health care provider about:  Any allergies you have, including allergies to prescribed medicine or latex.  All medicines you are taking, including vitamins, herbs, eye drops, creams, and over-the-counter medicines. This is especially important if you take any blood-thinning medicine. Bring a list of all of your medicines to your appointment.  Any problems you or family members have had with anesthetic medicines.  Any blood disorders you have.  Any surgeries you have had.  Your medical history and any medical conditions you have.  Whether you are pregnant or  may be pregnant.  Recent vaginal infections you have had.  Recent menstrual periods, bleeding problems you have had, and what form of birth control (contraception) you use. What are the risks? Generally, this is a safe procedure. However, problems may occur, including:  Infection.  Heavy vaginal bleeding.  Allergic reactions to medicines.  Damage to the cervix or other structures or organs.  Development of scar tissue (adhesions) inside the uterus, which can cause abnormal amounts of menstrual bleeding. This may make it harder to get pregnant in the future.  A hole (perforation) or puncture in the uterine wall. This is rare.  What happens before the procedure? Staying hydrated Follow instructions from your health care provider about hydration, which may include:  Up to 2 hours before the procedure - you may continue to drink clear liquids, such as water, clear fruit juice, black coffee, and plain tea.  Eating and drinking restrictions Follow instructions from your health care provider about eating and drinking, which may include:  8 hours before the procedure - stop eating heavy meals or foods such as meat, fried foods, or fatty foods.  6 hours before the procedure - stop eating light meals or foods, such as toast or cereal.  6 hours before the procedure - stop drinking milk or drinks that contain milk.  2 hours before the procedure - stop drinking clear liquids. If your health care provider told you to take your medicine(s) on the day of your procedure, take them with only a sip of water.  Medicines  Ask your health care provider about: ? Changing or stopping your regular medicines. This is especially important if you are taking diabetes medicines or blood thinners. ?  Taking medicines such as aspirin and ibuprofen. These medicines can thin your blood. Do not take these medicines before your procedure if your health care provider instructs you not to.  You may be given  antibiotic medicine to help prevent infection. General instructions  For 24 hours before your procedure, do not: ? Douche. ? Use tampons. ? Use medicines, creams, or suppositories in the vagina. ? Have sexual intercourse.  You may be given a pregnancy test on the day of the procedure.  Plan to have someone take you home from the hospital or clinic.  You may have a blood or urine sample taken.  If you will be going home right after the procedure, plan to have someone with you for 24 hours. What happens during the procedure?  To reduce your risk of infection: ? Your health care team will wash or sanitize their hands. ? Your skin will be washed with soap.  An IV tube will be inserted into one of your veins.  You will be given one of the following: ? A medicine that numbs the area in and around the cervix (local anesthetic). ? A medicine to make you fall asleep (general anesthetic).  You will lie down on your back, with your feet in foot rests (stirrups).  The size and position of your uterus will be checked.  A lubricated instrument (speculum or Sims retractor) will be inserted into the back side of your vagina. The speculum will be used to hold apart the walls of your vagina so your health care provider can see your cervix.  A tool (tenaculum) will be attached to the lip of the cervix to stabilize it.  Your cervix will be softened and dilated. This may be done by: ? Taking a medicine. ? Having tapered dilators or thin rods (laminaria) or gradual widening instruments (tapered dilators) inserted into your cervix.  A small, sharp, curved instrument (curette) will be used to scrape a small amount of tissue or cells from the endometrium or cervical canal. In some cases, gentle suction is applied with the curette. The curette will then be removed. The cells will be taken to a lab for testing. The procedure may vary among health care providers and hospitals. What happens after the  procedure?  You may have mild cramping, backache, pain, and light bleeding or spotting. You may pass small blood clots from your vagina.  You may have to wear compression stockings. These stockings help to prevent blood clots and reduce swelling in your legs.  Your blood pressure, heart rate, breathing rate, and blood oxygen level will be monitored until the medicines you were given have worn off. Summary  Dilation and curettage (D&C) involves stretching (dilation) the cervix and scraping (curettage) the inside lining of the uterus (endometrium).  After the procedure, you may have mild cramping, backache, pain, and light bleeding or spotting. You may pass small blood clots from your vagina.  Plan to have someone take you home from the hospital or clinic. This information is not intended to replace advice given to you by your health care provider. Make sure you discuss any questions you have with your health care provider. Document Released: 02/24/2005 Document Revised: 11/11/2015 Document Reviewed: 11/11/2015 Elsevier Interactive Patient Education  2018 [employer].

## 2017-06-29 NOTE — Progress Notes (Signed)
Julia [name] 57 y.o. reports she had [name] about 1-2 days of bleeding prior to her last visit to the office. she hasn't had any bleeding since that episode. She denies any other symptoms.  Endometrial Biopsy: Patient given informed consent, signed copy in the chart, time out was performed. Time out taken. The patient was placed in the lithotomy position and the cervix brought into view with sterile speculum.  Portio of cervix cleansed x 2 with betadine swabs.  A tenaculum was placed in the anterior lip of the cervix. The uterus was sounded for depth of 7 cm,. Milex uterine Explora 3 mm was introduced to into the uterus, suction created,  and an endometrial sample was obtained, a moderate amount of tissue, intermittently gritty feel, suggestive of a polyp. All equipment was removed and accounted for.   The patient tolerated the procedure well.   Patient given post procedure instructions.  Followup: 4/26 for results and plans Done with DrRiccio By signing my name below, I, Soijett Blue, attest that this documentation [name] been prepared under the direction and in the presence of Jonnie Kind, MD. Electronically Signed: Soijett Blue, Presenter, broadcasting. 06/29/17. 9:35 AM.  I personally performed the services described in this documentation, which was SCRIBED in my presence. The recorded information [name] been reviewed and considered accurate. It [name] been edited as necessary during review. Jonnie Kind, MD

## 2017-07-01 DIAGNOSIS — Z1389 Encounter for screening for other disorder: Secondary | ICD-10-CM | POA: Diagnosis not present

## 2017-07-01 DIAGNOSIS — I10 Essential (primary) hypertension: Secondary | ICD-10-CM | POA: Diagnosis not present

## 2017-07-01 DIAGNOSIS — E663 Overweight: Secondary | ICD-10-CM | POA: Diagnosis not present

## 2017-07-01 DIAGNOSIS — Z6827 Body mass index (BMI) 27.0-27.9, adult: Secondary | ICD-10-CM | POA: Diagnosis not present

## 2017-07-02 ENCOUNTER — Telehealth: Payer: Self-pay | Admitting: *Deleted

## 2017-07-02 NOTE — Telephone Encounter (Signed)
Pt is requesting a prescription to help her stop bleeding. Will send message to Dr. Glo Herring.

## 2017-07-06 ENCOUNTER — Other Ambulatory Visit: Payer: Self-pay | Admitting: Obstetrics and Gynecology

## 2017-07-06 MED ORDER — MEGESTROL ACETATE 40 MG PO TABS
40.0000 mg | ORAL_TABLET | Freq: Three times a day (TID) | ORAL | 2 refills | Status: DC
Start: 2017-07-06 — End: 2017-09-22

## 2017-07-06 NOTE — Telephone Encounter (Signed)
Rx Megace sent to [facility]

## 2017-07-06 NOTE — Progress Notes (Signed)
Megace 40 mg tid Rx sent to [facility]

## 2017-07-10 DIAGNOSIS — E663 Overweight: Secondary | ICD-10-CM | POA: Diagnosis not present

## 2017-07-10 DIAGNOSIS — M47816 Spondylosis without myelopathy or radiculopathy, lumbar region: Secondary | ICD-10-CM | POA: Diagnosis not present

## 2017-07-10 DIAGNOSIS — Q8500 Neurofibromatosis, unspecified: Secondary | ICD-10-CM | POA: Diagnosis not present

## 2017-07-10 DIAGNOSIS — M545 Low back pain: Secondary | ICD-10-CM | POA: Diagnosis not present

## 2017-07-10 DIAGNOSIS — Z6827 Body mass index (BMI) 27.0-27.9, adult: Secondary | ICD-10-CM | POA: Diagnosis not present

## 2017-07-10 DIAGNOSIS — I10 Essential (primary) hypertension: Secondary | ICD-10-CM | POA: Diagnosis not present

## 2017-07-21 ENCOUNTER — Telehealth: Payer: Self-pay | Admitting: Obstetrics and Gynecology

## 2017-07-21 NOTE — Telephone Encounter (Signed)
She and informed of the biopsy which suggested endometrial polyp.  Review of the note indicates that we thought it was an endometrial polyp at the time of the biopsy due to the just the sensation of the biopsy. Patient has appointment in the a.m. we will schedule her hysteroscopy D&C

## 2017-07-22 ENCOUNTER — Ambulatory Visit (INDEPENDENT_AMBULATORY_CARE_PROVIDER_SITE_OTHER): Payer: Medicare Other | Admitting: Obstetrics and Gynecology

## 2017-07-22 ENCOUNTER — Encounter: Payer: Self-pay | Admitting: Obstetrics and Gynecology

## 2017-07-22 ENCOUNTER — Telehealth: Payer: Self-pay | Admitting: Obstetrics and Gynecology

## 2017-07-22 VITALS — BP 136/90 | HR 88 | Ht 60.0 in | Wt 139.0 lb

## 2017-07-22 DIAGNOSIS — B029 Zoster without complications: Secondary | ICD-10-CM

## 2017-07-22 MED ORDER — VALACYCLOVIR HCL 1 G PO TABS: 1000.0000 mg | ORAL_TABLET | Freq: Every day | ORAL | 2 refills | Status: DC

## 2017-07-22 NOTE — Progress Notes (Signed)
Patient ID: Julia [name], female   DOB: June 12, 1960, 57 y.o.   MRN: [id number] Preoperative History and Physical  Julia [name] is a 57 y.o. G3P3 here for surgical management of endometrial polyp. She is accompanied by her daughter. She also complains of a skin rash to vagina that is similar to an area of discomfort to her right buttock, that stings. She denies fever, chills or any other symptoms or complaints at this time.  No significant preoperative concerns. Surgery will be postponed as it appears she has herpes or shingles Proposed surgery: Hysteroscopy D&C and multiple skin tag excision  Past Medical History:  Diagnosis Date  . Anxiety   . Brain tumor (benign) ([place])   . Chronic pain   . Elevated cholesterol   . Headache(784.0)   . Hypertension   . Numerous moles   . Trichimoniasis    Past Surgical History:  Procedure Laterality Date  . TUBAL LIGATION     OB History  Gravida Para Term Preterm AB Living  3 3       3   SAB TAB Ectopic Multiple Live Births               # Outcome Date GA Lbr Len/2nd Weight Sex Delivery Anes PTL Lv  3 Para      Vag-Spont     2 Para      Vag-Spont     1 Para      Vag-Spont     Patient denies any other pertinent gynecologic issues.   Current Outpatient Medications on File Prior to Visit  Medication Sig Dispense Refill  . ALPRAZolam (XANAX) 1 MG tablet Take 1 mg by mouth 4 (four) times daily as needed for anxiety.     Marland Kitchen amitriptyline (ELAVIL) 25 MG tablet Take 25 mg by mouth at bedtime.     Marland Kitchen amLODipine (NORVASC) 10 MG tablet     . butalbital-acetaminophen-caffeine (FIORICET, ESGIC) 50-325-40 MG tablet     . ezetimibe (ZETIA) 10 MG tablet Take 10 mg by mouth daily.     . ferrous sulfate 325 (65 FE) MG tablet Take 325 mg by mouth at bedtime.    . hydrochlorothiazide (HYDRODIURIL) 12.5 MG tablet Take 1 tablet by mouth daily.    . Linaclotide (LINZESS) 145 MCG CAPS 1 PO 30 mins prior to your first meal (Patient taking differently: 145 mcg. 1  by mouth 30 mins prior to your first meal) 30 capsule 11  . losartan (COZAAR) 50 MG tablet 50 mg daily.     . megestrol (MEGACE) 40 MG tablet Take 1 tablet (40 mg total) by mouth 3 (three) times daily. Til bleeding stops then once daily 45 tablet 2  . oxyCODONE-acetaminophen (PERCOCET/ROXICET) 5-325 MG tablet Take 1 tablet by mouth 4 (four) times daily.    Marland Kitchen PARoxetine (PAXIL) 20 MG tablet Take 20 mg by mouth at bedtime.    . pravastatin (PRAVACHOL) 40 MG tablet Take 40 mg by mouth daily.    Marland Kitchen topiramate (TOPAMAX) 100 MG tablet Take 100 mg by mouth daily.     . potassium chloride (K-DUR) 10 MEQ tablet Take 1 tablet (10 mEq total) by mouth 2 (two) times daily. (Patient not taking: Reported on 06/17/2017) 10 tablet 0   No current facility-administered medications on file prior to visit.    Allergies  Allergen Reactions  . Naprosyn [Naproxen] Nausea Only  . Penicillins Nausea And Vomiting    Has patient had a PCN reaction causing immediate rash, facial/tongue/throat  swelling, SOB or lightheadedness with hypotension: No Has patient had a PCN reaction causing severe rash involving mucus membranes or skin necrosis: No Has patient had a PCN reaction that required hospitalization Yes Has patient had a PCN reaction occurring within the last 10 years: No If all of the above answers are "NO", then may proceed with Cephalosporin use.   . Promethazine Hcl Nausea Only    Social History:   reports that she has quit smoking. Her smoking use included cigarettes. She has never used smokeless tobacco. She reports that she does not drink alcohol or use drugs.  Family History  Problem Relation Age of Onset  . Stroke Mother   . Heart failure Mother   . Cancer Father        lung  . Diabetes Brother     Review of Systems: Noncontributory  PHYSICAL EXAM: Blood pressure 136/90, pulse 88, height 5' (1.524 m), weight 139 lb (63 kg), last menstrual period 09/06/2012. General appearance - alert, well  appearing, and in no distress.  Her niece has multiple long-standing warty growths on the face some which her intradermal on some appear to be condylomatous Chest - clear to auscultation, no wheezes, rales or rhonchi, symmetric air entry Heart - normal rate and regular rhythm Abdomen - soft, nontender, nondistended, no masses or organomegaly                      Pelvic - examination External genitalia - inguinal crease and right buttock appears to be shingles there is a cluster of vesicular lesions in the inguinal crease on the right and an additional cluster of 5 or 6 small vesicles that are yellow, not ruptured, on the right buttock Vulva - skin tag, keratin horn left anterior thigh, right posterior thigh Cervix - Deferred Uterus - Deferred  Extremities - peripheral pulses normal, no pedal edema, no clubbing or cyanosis  Labs: No results found for this or any previous visit (from the past 336 hour(s)).  Imaging Studies: No results found.  Assessment: 1. Shingles of right buttock and right inner thigh Patient Active Problem List   Diagnosis Date Noted  . Thickened endometrium 06/17/2017  . Screening for colorectal cancer 06/09/2017  . Encounter for gynecological examination with Papanicolaou smear of cervix 06/09/2017  . PMB (postmenopausal bleeding) 06/09/2017  . Constipation - functional 09/30/2012  . Colon cancer screening 09/30/2012  . Trichomoniasis of vagina 08/31/2012  . Pelvic relaxation due to cystocele, midline 06/02/2012  . Rectocele 06/02/2012  Multiple skin tags acrochordon and suspected condylomatous lesions of the face Plan: 1. Rx acyclovir   2. F/u 2 weeks 3. Once Shingles virus clears up, Patient will undergo surgical management with Hysteroscopy D&C and multiple skin tag excision.      By signing my name below, I, Margit Banda, attest that this documentation has been prepared under the direction and in the presence of Jonnie Kind,  MD. Electronically Signed: Margit Banda, Medical Scribe. 07/22/17. 11:24 AM.  I personally performed the services described in this documentation, which was SCRIBED in my presence. The recorded information has been reviewed and considered accurate. It has been edited as necessary during review. Jonnie Kind, MD

## 2017-07-23 DIAGNOSIS — M542 Cervicalgia: Secondary | ICD-10-CM | POA: Diagnosis not present

## 2017-07-23 DIAGNOSIS — I10 Essential (primary) hypertension: Secondary | ICD-10-CM | POA: Diagnosis not present

## 2017-07-23 DIAGNOSIS — D329 Benign neoplasm of meninges, unspecified: Secondary | ICD-10-CM | POA: Diagnosis not present

## 2017-07-23 DIAGNOSIS — G43701 Chronic migraine without aura, not intractable, with status migrainosus: Secondary | ICD-10-CM | POA: Diagnosis not present

## 2017-08-06 ENCOUNTER — Encounter: Payer: Medicare Other | Admitting: Obstetrics and Gynecology

## 2017-08-11 ENCOUNTER — Other Ambulatory Visit: Payer: Self-pay | Admitting: Obstetrics and Gynecology

## 2017-08-13 NOTE — Telephone Encounter (Signed)
VALTREX X 30 D X 2 DOSES CALLED IN

## 2017-08-18 DIAGNOSIS — E782 Mixed hyperlipidemia: Secondary | ICD-10-CM | POA: Diagnosis not present

## 2017-08-18 DIAGNOSIS — Z0001 Encounter for general adult medical examination with abnormal findings: Secondary | ICD-10-CM | POA: Diagnosis not present

## 2017-08-18 DIAGNOSIS — G43909 Migraine, unspecified, not intractable, without status migrainosus: Secondary | ICD-10-CM | POA: Diagnosis not present

## 2017-08-18 DIAGNOSIS — Z1389 Encounter for screening for other disorder: Secondary | ICD-10-CM | POA: Diagnosis not present

## 2017-08-18 DIAGNOSIS — I10 Essential (primary) hypertension: Secondary | ICD-10-CM | POA: Diagnosis not present

## 2017-08-18 DIAGNOSIS — Q8500 Neurofibromatosis, unspecified: Secondary | ICD-10-CM | POA: Diagnosis not present

## 2017-08-18 DIAGNOSIS — Z6826 Body mass index (BMI) 26.0-26.9, adult: Secondary | ICD-10-CM | POA: Diagnosis not present

## 2017-08-18 DIAGNOSIS — D510 Vitamin B12 deficiency anemia due to intrinsic factor deficiency: Secondary | ICD-10-CM | POA: Diagnosis not present

## 2017-08-18 DIAGNOSIS — E6609 Other obesity due to excess calories: Secondary | ICD-10-CM | POA: Diagnosis not present

## 2017-08-19 ENCOUNTER — Telehealth: Payer: Self-pay | Admitting: *Deleted

## 2017-08-19 ENCOUNTER — Telehealth: Payer: Self-pay | Admitting: Obstetrics and Gynecology

## 2017-08-19 ENCOUNTER — Ambulatory Visit (INDEPENDENT_AMBULATORY_CARE_PROVIDER_SITE_OTHER): Payer: Medicare Other | Admitting: Obstetrics and Gynecology

## 2017-08-19 DIAGNOSIS — Z5329 Procedure and treatment not carried out because of patient's decision for other reasons: Secondary | ICD-10-CM

## 2017-08-19 NOTE — Telephone Encounter (Signed)
Patient reminded of her appointment this morning for which she no showed.  Patient asked to please call us and keep her appointments so that we can remove the endometrial polyp

## 2017-08-19 NOTE — Progress Notes (Signed)
Preoperative History and Physical  Julia [name] is a 57 y.o. G3P3 here for surgical management of  1. Postmenopausal bleeding suspected due to endometrial polyp 2. Multiple skin nevi and skin tags. .   No significant preoperative concerns.  Proposed surgery:  1 Hysteroscopy, Dilation and curettage, removal of endometrial polyp 2. Excision of multiple skin tags.   Past Medical History:  Diagnosis Date  . Anxiety   . Brain tumor (benign) ([place])   . Chronic pain   . Elevated cholesterol   . Headache(784.0)   . Hypertension   . Numerous moles   . Trichimoniasis    Past Surgical History:  Procedure Laterality Date  . TUBAL LIGATION     OB History  Gravida Para Term Preterm AB Living  3 3       3   SAB TAB Ectopic Multiple Live Births               # Outcome Date GA Lbr Len/2nd Weight Sex Delivery Anes PTL Lv  3 Para      Vag-Spont     2 Para      Vag-Spont     1 Para      Vag-Spont     Patient denies any other pertinent gynecologic issues.   Current Outpatient Medications on File Prior to Visit  Medication Sig Dispense Refill  . ALPRAZolam (XANAX) 1 MG tablet Take 1 mg by mouth 4 (four) times daily as needed for anxiety.     Marland Kitchen amitriptyline (ELAVIL) 25 MG tablet Take 25 mg by mouth at bedtime.     Marland Kitchen amLODipine (NORVASC) 10 MG tablet     . butalbital-acetaminophen-caffeine (FIORICET, ESGIC) 50-325-40 MG tablet     . ezetimibe (ZETIA) 10 MG tablet Take 10 mg by mouth daily.     . ferrous sulfate 325 (65 FE) MG tablet Take 325 mg by mouth at bedtime.    . hydrochlorothiazide (HYDRODIURIL) 12.5 MG tablet Take 1 tablet by mouth daily.    . Linaclotide (LINZESS) 145 MCG CAPS 1 PO 30 mins prior to your first meal (Patient taking differently: 145 mcg. 1 by mouth 30 mins prior to your first meal) 30 capsule 11  . losartan (COZAAR) 50 MG tablet 50 mg daily.     . megestrol (MEGACE) 40 MG tablet Take 1 tablet (40 mg total) by mouth 3 (three) times daily. Til bleeding stops then  once daily 45 tablet 2  . oxyCODONE-acetaminophen (PERCOCET/ROXICET) 5-325 MG tablet Take 1 tablet by mouth 4 (four) times daily.    Marland Kitchen PARoxetine (PAXIL) 20 MG tablet Take 20 mg by mouth at bedtime.    . potassium chloride (K-DUR) 10 MEQ tablet Take 1 tablet (10 mEq total) by mouth 2 (two) times daily. (Patient not taking: Reported on 06/17/2017) 10 tablet 0  . pravastatin (PRAVACHOL) 40 MG tablet Take 40 mg by mouth daily.    Marland Kitchen topiramate (TOPAMAX) 100 MG tablet Take 100 mg by mouth daily.     . valACYclovir (VALTREX) 1000 MG tablet TAKE (1) TABLET BY MOUTH ONCE DAILY. 30 tablet 1   No current facility-administered medications on file prior to visit.    Allergies  Allergen Reactions  . Naprosyn [Naproxen] Nausea Only  . Penicillins Nausea And Vomiting    Has patient had a PCN reaction causing immediate rash, facial/tongue/throat swelling, SOB or lightheadedness with hypotension: No Has patient had a PCN reaction causing severe rash involving mucus membranes or skin necrosis: No Has patient had  a PCN reaction that required hospitalization Yes Has patient had a PCN reaction occurring within the last 10 years: No If all of the above answers are "NO", then may proceed with Cephalosporin use.   . Promethazine Hcl Nausea Only    Social History:   reports that she has quit smoking. Her smoking use included cigarettes. She has never used smokeless tobacco. She reports that she does not drink alcohol or use drugs.  Family History  Problem Relation Age of Onset  . Stroke Mother   . Heart failure Mother   . Cancer Father        lung  . Diabetes Brother     Review of Systems: Noncontributory  PHYSICAL EXAM: Patient NO SHOWED AFTER CHART PREPPED. Labs: No results found for this or any previous visit (from the past 336 hour(s)).  Imaging Studies: No results found.  Assessment: Patient Active Problem List   Diagnosis Date Noted  . Thickened endometrium 06/17/2017  . Screening for  colorectal cancer 06/09/2017  . Encounter for gynecological examination with Papanicolaou smear of cervix 06/09/2017  . PMB (postmenopausal bleeding) 06/09/2017  . Constipation - functional 09/30/2012  . Colon cancer screening 09/30/2012  . Trichomoniasis of vagina 08/31/2012  . Pelvic relaxation due to cystocele, midline 06/02/2012  . Rectocele 06/02/2012    Plan: WILL CONTACT PT TO RESCHEDULE. .mec 08/19/2017 8:56 AM

## 2017-08-24 ENCOUNTER — Encounter: Payer: Self-pay | Admitting: Obstetrics and Gynecology

## 2017-08-24 ENCOUNTER — Ambulatory Visit (INDEPENDENT_AMBULATORY_CARE_PROVIDER_SITE_OTHER): Payer: Medicare Other | Admitting: Obstetrics and Gynecology

## 2017-08-24 VITALS — BP 119/80 | HR 82 | Ht 61.0 in | Wt 138.6 lb

## 2017-08-24 DIAGNOSIS — Z113 Encounter for screening for infections with a predominantly sexual mode of transmission: Secondary | ICD-10-CM

## 2017-08-24 DIAGNOSIS — N950 Postmenopausal bleeding: Secondary | ICD-10-CM

## 2017-08-24 DIAGNOSIS — N9089 Other specified noninflammatory disorders of vulva and perineum: Secondary | ICD-10-CM | POA: Diagnosis not present

## 2017-08-24 DIAGNOSIS — L858 Other specified epidermal thickening: Secondary | ICD-10-CM

## 2017-08-24 DIAGNOSIS — N840 Polyp of corpus uteri: Secondary | ICD-10-CM

## 2017-08-24 NOTE — Progress Notes (Signed)
Patient ID: Julia [name], female   DOB: 18-Mar-1960, 57 y.o.   MRN: [id number]  Preoperative History and Physical  Julia [name] is a 57 y.o. G3P3 here for surgical management of endometrial polyp.She would like to have some skin tags removed specifically around her face, neck, and . 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 She doesn't take linzess anymore, she takes hydrochlorothiazide for her HTN.  No significant preoperative concerns.  Proposed surgery: Hysteroscopy and D&C, removal of endometrial polyp 2. Excision of multiple facial and neck polyps 3 excision vulvar lesion 4 excision right thigh lesion  Past Medical History:  Diagnosis Date  . Anxiety   . Brain tumor (benign) ([place])   . Chronic pain   . Elevated cholesterol   . Headache(784.0)   . Hypertension   . Numerous moles   . Trichimoniasis    Past Surgical History:  Procedure Laterality Date  . TUBAL LIGATION     OB History  Gravida Para Term Preterm AB Living  3 3       3   SAB TAB Ectopic Multiple Live Births               # Outcome Date GA Lbr Len/2nd Weight Sex Delivery Anes PTL Lv  3 Para      Vag-Spont     2 Para      Vag-Spont     1 Para      Vag-Spont     Patient denies any other pertinent gynecologic issues.   Current Outpatient Medications on File Prior to Visit  Medication Sig Dispense Refill  . ALPRAZolam (XANAX) 1 MG tablet Take 1 mg by mouth 4 (four) times daily as needed for anxiety.     Marland Kitchen amitriptyline (ELAVIL) 25 MG tablet Take 25 mg by mouth at bedtime.     Marland Kitchen amLODipine (NORVASC) 10 MG tablet     . ferrous sulfate 325 (65 FE) MG tablet Take 325 mg by mouth at bedtime.    . hydrochlorothiazide (HYDRODIURIL) 12.5 MG tablet Take 1 tablet by mouth daily.    Marland Kitchen losartan (COZAAR) 50 MG tablet 50 mg daily.     Marland Kitchen oxyCODONE-acetaminophen (PERCOCET/ROXICET) 5-325 MG tablet Take 1 tablet by mouth 4 (four) times daily.    Marland Kitchen PARoxetine  (PAXIL) 20 MG tablet Take 20 mg by mouth at bedtime.    . potassium chloride (K-DUR) 10 MEQ tablet Take 1 tablet (10 mEq total) by mouth 2 (two) times daily. 10 tablet 0  . pravastatin (PRAVACHOL) 40 MG tablet Take 40 mg by mouth daily.    Marland Kitchen topiramate (TOPAMAX) 100 MG tablet Take 100 mg by mouth daily.     . butalbital-acetaminophen-caffeine (FIORICET, ESGIC) 50-325-40 MG tablet     . ezetimibe (ZETIA) 10 MG tablet Take 10 mg by mouth daily.     . Linaclotide (LINZESS) 145 MCG CAPS 1 PO 30 mins prior to your first meal (Patient not taking: Reported on 08/24/2017) 30 capsule 11  . megestrol (MEGACE) 40 MG tablet Take 1 tablet (40 mg total) by mouth 3 (three) times daily. Til bleeding stops then once daily (Patient not taking: Reported on 08/24/2017) 45 tablet 2  . valACYclovir (VALTREX) 1000 MG tablet TAKE (1) TABLET BY MOUTH ONCE DAILY. (Patient not taking: Reported on 08/24/2017) 30 tablet 1   No current facility-administered medications on file prior to visit.    Allergies  Allergen Reactions  .  Naprosyn [Naproxen] Nausea Only  . Penicillins Nausea And Vomiting    Has patient had a PCN reaction causing immediate rash, facial/tongue/throat swelling, SOB or lightheadedness with hypotension: No Has patient had a PCN reaction causing severe rash involving mucus membranes or skin necrosis: No Has patient had a PCN reaction that required hospitalization Yes Has patient had a PCN reaction occurring within the last 10 years: No If all of the above answers are "NO", then may proceed with Cephalosporin use.   . Promethazine Hcl Nausea Only    Social History:   reports that she has quit smoking. Her smoking use included cigarettes. She has never used smokeless tobacco. She reports that she does not drink alcohol or use drugs.  Family History  Problem Relation Age of Onset  . Stroke Mother   . Heart failure Mother   . Cancer Father        lung  . Diabetes Brother     Review of Systems:  Noncontributory  PHYSICAL EXAM: Blood pressure 119/80, pulse 82, height 5\' 1"  (1.549 m), weight 138 lb 9.6 oz (62.9 kg), last menstrual period 09/06/2012. General appearance - alert, well appearing, and in no distress Chest - clear to auscultation, no wheezes, rales or rhonchi, symmetric air entry Heart - normal rate and regular rhythm Abdomen - soft, nontender, nondistended, no masses or organomegaly   Has keratin horne on right inner thigh  Pelvic exam:  VULVA: normal VAGINA: skin tag at entrance, CERVIX: normal UTERUS: first degree descensus denies problems when passing bowel movements   Extremities - peripheral pulses normal, no pedal edema, no clubbing or cyanosis  Labs: No results found for this or any previous visit (from the past 336 hour(s)).  Imaging Studies: No results found.  Assessment: Patient Active Problem List   Diagnosis Date Noted  . Thickened endometrium 06/17/2017  . Screening for colorectal cancer 06/09/2017  . Encounter for gynecological examination with Papanicolaou smear of cervix 06/09/2017  . PMB (postmenopausal bleeding) 06/09/2017  . Constipation - functional 09/30/2012  . Colon cancer screening 09/30/2012  . Trichomoniasis of vagina 08/31/2012  . Pelvic relaxation due to cystocele, midline 06/02/2012  . Rectocele 06/02/2012    Plan: Patient will undergo surgical management with 1 hysteroscopy, dilation and curettage removal endometrial polyp.  2. Excision multiple skin lesions face and neck. 3. Excision vulvar skin lesion 4  Excision right thigh skin lesion  By signing my name below, I, Julia [name], attest that this documentation has been prepared under the direction and in the presence of Jonnie Kind, MD. Electronically Signed: [place]. 08/24/17. 1:35 PM.  I personally performed the services described in this documentation, which was SCRIBED in my presence. The recorded information has been reviewed and  considered accurate. It has been edited as necessary during review. Jonnie Kind, MD

## 2017-08-26 LAB — GC/CHLAMYDIA PROBE AMP
CHLAMYDIA, DNA PROBE: NEGATIVE
NEISSERIA GONORRHOEAE BY PCR: NEGATIVE

## 2017-09-16 NOTE — Patient Instructions (Signed)
Your procedure is scheduled on: 09/22/2017  Report to Julia [name] at  7:20   AM.  Call this number if you have problems the morning of surgery: [phone]   Remember:   Do not drink or eat food:After Midnight.  :  Take these medicines the morning of surgery with A SIP OF WATER: Amlodipine, Zetia, and Losartan   Do not wear jewelry, make-up or nail polish.  Do not wear lotions, powders, or perfumes. You may wear deodorant.  Do not shave 48 hours prior to surgery. Men may shave face and neck.  Do not bring valuables to the hospital.  Contacts, dentures or bridgework may not be worn into surgery.  Leave suitcase in the car. After surgery it may be brought to your room.  For patients admitted to the hospital, checkout time is 11:00 AM the day of discharge.   Patients discharged the day of surgery will not be allowed to drive home.    Special Instructions: Shower using CHG night before surgery and shower the day of surgery use CHG.  Use special wash - you have one bottle of CHG for all showers.  You should use approximately 1/2 of the bottle for each shower.  Endometrial Ablation Endometrial ablation is a procedure that destroys the thin inner layer of the lining of the uterus (endometrium). This procedure may be done:  To stop heavy periods.  To stop bleeding that is causing anemia.  To control irregular bleeding.  To treat bleeding caused by small tumors (fibroids) in the endometrium.  This procedure is often an alternative to major surgery, such as removal of the uterus and cervix (hysterectomy). As a result of this procedure:  You may not be able to have children. However, if you are premenopausal (you have not gone through menopause): ? You may still have a small chance of getting pregnant. ? You will need to use a reliable method of birth control after the procedure to prevent pregnancy.  You may stop having a menstrual period, or you may have only a small amount of  bleeding during your period. Menstruation may return several years after the procedure.  Tell a health care provider about:  Any allergies you have.  All medicines you are taking, including vitamins, herbs, eye drops, creams, and over-the-counter medicines.  Any problems you or family members have had with the use of anesthetic medicines.  Any blood disorders you have.  Any surgeries you have had.  Any medical conditions you have. What are the risks? Generally, this is a safe procedure. However, problems may occur, including:  A hole (perforation) in the uterus or bowel.  Infection of the uterus, bladder, or vagina.  Bleeding.  Damage to other structures or organs.  An air bubble in the lung (air embolus).  Problems with pregnancy after the procedure.  Failure of the procedure.  Decreased ability to diagnose cancer in the endometrium.  What happens before the procedure?  You will have tests of your endometrium to make sure there are no pre-cancerous cells or cancer cells present.  You may have an ultrasound of the uterus.  You may be given medicines to thin the endometrium.  Ask your health care provider about: ? Changing or stopping your regular medicines. This is especially important if you take diabetes medicines or blood thinners. ? Taking medicines such as aspirin and ibuprofen. These medicines can thin your blood. Do not take these medicines before your procedure if your doctor tells you not to.  Plan to have someone take you home from the hospital or clinic. What happens during the procedure?  You will lie on an exam table with your feet and legs supported as in a pelvic exam.  To lower your risk of infection: ? Your health care team will wash or sanitize their hands and put on germ-free (sterile) gloves. ? Your genital area will be washed with soap.  An IV tube will be inserted into one of your veins.  You will be given a medicine to help you relax  (sedative).  A surgical instrument with a light and camera (resectoscope) will be inserted into your vagina and moved into your uterus. This allows your surgeon to see inside your uterus.  Endometrial tissue will be removed using one of the following methods: ? Radiofrequency. This method uses a radiofrequency-alternating electric current to remove the endometrium. ? Cryotherapy. This method uses extreme cold to freeze the endometrium. ? Heated-free liquid. This method uses a heated saltwater (saline) solution to remove the endometrium. ? Microwave. This method uses high-energy microwaves to heat up the endometrium and remove it. ? Thermal balloon. This method involves inserting a catheter with a balloon tip into the uterus. The balloon tip is filled with heated fluid to remove the endometrium. The procedure may vary among health care providers and hospitals. What happens after the procedure?  Your blood pressure, heart rate, breathing rate, and blood oxygen level will be monitored until the medicines you were given have worn off.  As tissue healing occurs, you may notice vaginal bleeding for 4-6 weeks after the procedure. You may also experience: ? Cramps. ? Thin, watery vaginal discharge that is light pink or brown in color. ? A need to urinate more frequently than usual. ? Nausea.  Do not drive for 24 hours if you were given a sedative.  Do not have sex or insert anything into your vagina until your health care provider approves. Summary  Endometrial ablation is done to treat the many causes of heavy menstrual bleeding.  The procedure may be done only after medications have been tried to control the bleeding.  Plan to have someone take you home from the hospital or clinic. This information is not intended to replace advice given to you by your health care provider. Make sure you discuss any questions you have with your health care provider. Document Released: 01/04/2004 Document  Revised: 03/13/2016 Document Reviewed: 03/13/2016 Elsevier Interactive Patient Education  2017 [place]. Hysteroscopy Hysteroscopy is a procedure used for looking inside the womb (uterus). It may be done for various reasons, including:  To evaluate abnormal bleeding, fibroid (benign, noncancerous) tumors, polyps, scar tissue (adhesions), and possibly cancer of the uterus.  To look for lumps (tumors) and other uterine growths.  To look for causes of why a woman cannot get pregnant (infertility), causes of recurrent loss of pregnancy (miscarriages), or a lost intrauterine device (IUD).  To perform a sterilization by blocking the fallopian tubes from inside the uterus.  In this procedure, a thin, flexible tube with a tiny light and camera on the end of it (hysteroscope) is used to look inside the uterus. A hysteroscopy should be done right after a menstrual period to be sure you are not pregnant. LET [facility] CARE PROVIDER KNOW ABOUT:  Any allergies you have.  All medicines you are taking, including vitamins, herbs, eye drops, creams, and over-the-counter medicines.  Previous problems you or members of your family have had with the use of anesthetics.  Any blood disorders you have.  Previous surgeries you have had.  Medical conditions you have. RISKS AND COMPLICATIONS Generally, this is a safe procedure. However, as with any procedure, complications can occur. Possible complications include:  Putting a hole in the uterus.  Excessive bleeding.  Infection.  Damage to the cervix.  Injury to other organs.  Allergic reaction to medicines.  Too much fluid used in the uterus for the procedure.  BEFORE THE PROCEDURE  Ask your health care provider about changing or stopping any regular medicines.  Do not take aspirin or blood thinners for 1 week before the procedure, or as directed by your health care provider. These can cause bleeding.  If you smoke, do not smoke for 2  weeks before the procedure.  In some cases, a medicine is placed in the cervix the day before the procedure. This medicine makes the cervix have a larger opening (dilate). This makes it easier for the instrument to be inserted into the uterus during the procedure.  Do not eat or drink anything for at least 8 hours before the surgery.  Arrange for someone to take you home after the procedure. PROCEDURE  You may be given a medicine to relax you (sedative). You may also be given one of the following: ? A medicine that numbs the area around the cervix (local anesthetic). ? A medicine that makes you sleep through the procedure (general anesthetic).  The hysteroscope is inserted through the vagina into the uterus. The camera on the hysteroscope sends a picture to a TV screen. This gives the surgeon a good view inside the uterus.  During the procedure, air or a liquid is put into the uterus, which allows the surgeon to see better.  Sometimes, tissue is gently scraped from inside the uterus. These tissue samples are sent to a lab for testing. What to expect after the procedure  If you had a general anesthetic, you may be groggy for a couple hours after the procedure.  If you had a local anesthetic, you will be able to go home as soon as you are stable and feel ready.  You may have some cramping. This normally lasts for a couple days.  You may have bleeding, which varies from light spotting for a few days to menstrual-like bleeding for 3-7 days. This is normal.  If your test results are not back during the visit, make an appointment with your health care provider to find out the results. This information is not intended to replace advice given to you by your health care provider. Make sure you discuss any questions you have with your health care provider. Document Released: 06/02/2000 Document Revised: 08/02/2015 Document Reviewed: 09/23/2012 Elsevier Interactive Patient Education  2017  [place] Anesthesia, Adult, Care After These instructions provide you with information about caring for yourself after your procedure. Your health care provider may also give you more specific instructions. Your treatment has been planned according to current medical practices, but problems sometimes occur. Call your health care provider if you have any problems or questions after your procedure. What can I expect after the procedure? After the procedure, it is common to have:  Vomiting.  A sore throat.  Mental slowness.  It is common to feel:  Nauseous.  Cold or shivery.  Sleepy.  Tired.  Sore or achy, even in parts of your body where you did not have surgery.  Follow these instructions at home: For at least 24 hours after the procedure:  Do not: ? Participate in activities where you could fall or become injured. ? Drive. ? Use heavy machinery. ? Drink alcohol. ? Take sleeping pills or medicines that cause drowsiness. ? Make important decisions or sign legal documents. ? Take care of children on your own.  Rest. Eating and drinking  If you vomit, drink water, juice, or soup when you can drink without vomiting.  Drink enough fluid to keep your urine clear or pale yellow.  Make sure you have little or no nausea before eating solid foods.  Follow the diet recommended by your health care provider. General instructions  Have a responsible adult stay with you until you are awake and alert.  Return to your normal activities as told by your health care provider. Ask your health care provider what activities are safe for you.  Take over-the-counter and prescription medicines only as told by your health care provider.  If you smoke, do not smoke without supervision.  Keep all follow-up visits as told by your health care provider. This is important. Contact a health care provider if:  You continue to have nausea or vomiting at home, and medicines are not  helpful.  You cannot drink fluids or start eating again.  You cannot urinate after 8-12 hours.  You develop a skin rash.  You have fever.  You have increasing redness at the site of your procedure. Get help right away if:  You have difficulty breathing.  You have chest pain.  You have unexpected bleeding.  You feel that you are having a life-threatening or urgent problem. This information is not intended to replace advice given to you by your health care provider. Make sure you discuss any questions you have with your health care provider. Document Released: 06/02/2000 Document Revised: 07/30/2015 Document Reviewed: 02/08/2015 Elsevier Interactive Patient Education  [employer].

## 2017-09-17 ENCOUNTER — Encounter (HOSPITAL_COMMUNITY)
Admission: RE | Admit: 2017-09-17 | Discharge: 2017-09-17 | Disposition: A | Discharge status: Home / Self Care | Payer: Medicare Other | Source: Ambulatory Visit | Attending: Obstetrics and Gynecology | Admitting: Obstetrics and Gynecology

## 2017-09-17 ENCOUNTER — Other Ambulatory Visit: Payer: Self-pay | Admitting: Obstetrics and Gynecology

## 2017-09-17 DIAGNOSIS — Z0183 Encounter for blood typing: Secondary | ICD-10-CM | POA: Insufficient documentation

## 2017-09-17 DIAGNOSIS — Z01812 Encounter for preprocedural laboratory examination: Secondary | ICD-10-CM | POA: Insufficient documentation

## 2017-09-17 DIAGNOSIS — I10 Essential (primary) hypertension: Secondary | ICD-10-CM | POA: Diagnosis not present

## 2017-09-17 LAB — COMPREHENSIVE METABOLIC PANEL
ALT: 37 U/L (ref 0–44)
AST: 47 U/L — AB (ref 15–41)
Albumin: 4 g/dL (ref 3.5–5.0)
Alkaline Phosphatase: 111 U/L (ref 38–126)
Anion gap: 7 (ref 5–15)
BILIRUBIN TOTAL: 1.3 mg/dL — AB (ref 0.3–1.2)
BUN: 11 mg/dL (ref 6–20)
CHLORIDE: 108 mmol/L (ref 98–111)
CO2: 27 mmol/L (ref 22–32)
Calcium: 10.1 mg/dL (ref 8.9–10.3)
Creatinine, Ser: 0.79 mg/dL (ref 0.44–1.00)
GFR calc Af Amer: >60 mL/min (ref >60)
GLUCOSE: 93 mg/dL (ref 70–99)
POTASSIUM: 2.9 mmol/L — AB (ref 3.5–5.1)
Sodium: 142 mmol/L (ref 135–145)
TOTAL PROTEIN: 7 g/dL (ref 6.5–8.1)

## 2017-09-17 LAB — CBC WITH DIFFERENTIAL/PLATELET
BASOS ABS: 0 10*3/uL (ref 0.0–0.1)
Basophils Relative: 1 %
Eosinophils Absolute: 0.1 10*3/uL (ref 0.0–0.7)
Eosinophils Relative: 2 %
HEMATOCRIT: 38.6 % (ref 36.0–46.0)
Hemoglobin: 13.1 g/dL (ref 12.0–15.0)
LYMPHS PCT: 38 %
Lymphs Abs: 2.1 10*3/uL (ref 0.7–4.0)
MCH: 30.5 pg (ref 26.0–34.0)
MCHC: 33.9 g/dL (ref 30.0–36.0)
MCV: 89.8 fL (ref 78.0–100.0)
MONO ABS: 0.4 10*3/uL (ref 0.1–1.0)
MONOS PCT: 8 %
Neutro Abs: 2.8 10*3/uL (ref 1.7–7.7)
Neutrophils Relative %: 51 %
Platelets: 212 10*3/uL (ref 150–400)
RBC: 4.3 MIL/uL (ref 3.87–5.11)
RDW: 14.3 % (ref 11.5–15.5)
WBC: 5.5 10*3/uL (ref 4.0–10.5)

## 2017-09-17 LAB — TYPE AND SCREEN
ABO/RH(D): A POS
Antibody Screen: NEGATIVE

## 2017-09-17 LAB — URINALYSIS, ROUTINE W REFLEX MICROSCOPIC
Bilirubin Urine: NEGATIVE
Glucose, UA: NEGATIVE mg/dL
Hgb urine dipstick: NEGATIVE
Ketones, ur: NEGATIVE mg/dL
LEUKOCYTES UA: NEGATIVE
Nitrite: NEGATIVE
PROTEIN: NEGATIVE mg/dL
SPECIFIC GRAVITY, URINE: 1.012 (ref 1.005–1.030)
pH: 7 (ref 5.0–8.0)

## 2017-09-17 NOTE — Progress Notes (Addendum)
K+ 2.9 and was called to Dr Gerarda Fraction.  Will follow up tomorrow. Dr Gerarda Fraction has called in K+ for patient and office to notify her.  ISTAT will be done day of surgery.

## 2017-09-17 NOTE — Progress Notes (Unsigned)
Note:

## 2017-09-22 ENCOUNTER — Ambulatory Visit (HOSPITAL_COMMUNITY)
Admission: RE | Admit: 2017-09-22 | Discharge: 2017-09-22 | Disposition: A | Discharge status: Home / Self Care | Payer: Medicare Other | Source: Ambulatory Visit | Attending: Obstetrics and Gynecology | Admitting: Obstetrics and Gynecology

## 2017-09-22 ENCOUNTER — Encounter (HOSPITAL_COMMUNITY): Payer: Self-pay | Admitting: *Deleted

## 2017-09-22 ENCOUNTER — Ambulatory Visit (HOSPITAL_COMMUNITY): Payer: Medicare Other | Admitting: Anesthesiology

## 2017-09-22 ENCOUNTER — Encounter (HOSPITAL_COMMUNITY)
Admission: RE | Disposition: A | Discharge status: Home / Self Care | Payer: Self-pay | Source: Ambulatory Visit | Attending: Obstetrics and Gynecology

## 2017-09-22 DIAGNOSIS — F419 Anxiety disorder, unspecified: Secondary | ICD-10-CM | POA: Insufficient documentation

## 2017-09-22 DIAGNOSIS — Z79899 Other long term (current) drug therapy: Secondary | ICD-10-CM | POA: Insufficient documentation

## 2017-09-22 DIAGNOSIS — L918 Other hypertrophic disorders of the skin: Secondary | ICD-10-CM | POA: Diagnosis not present

## 2017-09-22 DIAGNOSIS — N840 Polyp of corpus uteri: Secondary | ICD-10-CM | POA: Diagnosis not present

## 2017-09-22 DIAGNOSIS — L821 Other seborrheic keratosis: Secondary | ICD-10-CM | POA: Insufficient documentation

## 2017-09-22 DIAGNOSIS — D2230 Melanocytic nevi of unspecified part of face: Secondary | ICD-10-CM | POA: Insufficient documentation

## 2017-09-22 DIAGNOSIS — D224 Melanocytic nevi of scalp and neck: Secondary | ICD-10-CM | POA: Diagnosis not present

## 2017-09-22 DIAGNOSIS — G8929 Other chronic pain: Secondary | ICD-10-CM | POA: Insufficient documentation

## 2017-09-22 DIAGNOSIS — N841 Polyp of cervix uteri: Secondary | ICD-10-CM | POA: Diagnosis not present

## 2017-09-22 DIAGNOSIS — I10 Essential (primary) hypertension: Secondary | ICD-10-CM | POA: Insufficient documentation

## 2017-09-22 DIAGNOSIS — N950 Postmenopausal bleeding: Secondary | ICD-10-CM | POA: Insufficient documentation

## 2017-09-22 DIAGNOSIS — R51 Headache: Secondary | ICD-10-CM | POA: Diagnosis not present

## 2017-09-22 DIAGNOSIS — L820 Inflamed seborrheic keratosis: Secondary | ICD-10-CM | POA: Diagnosis not present

## 2017-09-22 DIAGNOSIS — Z79891 Long term (current) use of opiate analgesic: Secondary | ICD-10-CM | POA: Diagnosis not present

## 2017-09-22 DIAGNOSIS — D2330 Other benign neoplasm of skin of unspecified part of face: Secondary | ICD-10-CM | POA: Diagnosis not present

## 2017-09-22 DIAGNOSIS — E7800 Pure hypercholesterolemia, unspecified: Secondary | ICD-10-CM | POA: Insufficient documentation

## 2017-09-22 DIAGNOSIS — D234 Other benign neoplasm of skin of scalp and neck: Secondary | ICD-10-CM | POA: Diagnosis not present

## 2017-09-22 DIAGNOSIS — Z87891 Personal history of nicotine dependence: Secondary | ICD-10-CM | POA: Insufficient documentation

## 2017-09-22 HISTORY — PX: EXCISION OF SKIN TAG: SHX6270

## 2017-09-22 HISTORY — PX: POLYPECTOMY: SHX5525

## 2017-09-22 HISTORY — PX: HYSTEROSCOPY WITH D & C: SHX1775

## 2017-09-22 HISTORY — PX: HYSTEROSCOPY W/D&C: SHX1775

## 2017-09-22 LAB — POCT I-STAT 4, (NA,K, GLUC, HGB,HCT)
Glucose, Bld: 96 mg/dL (ref 70–99)
HCT: 36 % (ref 36.0–46.0)
Hemoglobin: 12.2 g/dL (ref 12.0–15.0)
Potassium: 3.3 mmol/L — ABNORMAL LOW (ref 3.5–5.1)
Sodium: 140 mmol/L (ref 135–145)

## 2017-09-22 SURGERY — DILATATION AND CURETTAGE /HYSTEROSCOPY
Anesthesia: General

## 2017-09-22 MED ORDER — FENTANYL CITRATE (PF) 100 MCG/2ML IJ SOLN
INTRAMUSCULAR | Status: DC | PRN
  Administered 2017-09-22: 25 ug via INTRAVENOUS
  Administered 2017-09-22: 50 ug via INTRAVENOUS
  Administered 2017-09-22 (×3): 25 ug via INTRAVENOUS

## 2017-09-22 MED ORDER — ONDANSETRON HCL 4 MG/2ML IJ SOLN
INTRAMUSCULAR | Status: DC | PRN
  Administered 2017-09-22: 4 mg via INTRAVENOUS

## 2017-09-22 MED ORDER — LIDOCAINE HCL (PF) 1 % IJ SOLN
INTRAMUSCULAR | Status: DC | PRN
  Administered 2017-09-22: .5 mL

## 2017-09-22 MED ORDER — PROPOFOL 10 MG/ML IV BOLUS
INTRAVENOUS | Status: DC | PRN
  Administered 2017-09-22: 160 mg via INTRAVENOUS

## 2017-09-22 MED ORDER — MIDAZOLAM HCL 2 MG/2ML IJ SOLN
INTRAMUSCULAR | Status: AC
  Filled 2017-09-22: qty 2

## 2017-09-22 MED ORDER — HYDROCODONE-ACETAMINOPHEN 7.5-325 MG PO TABS: 1.0000 | ORAL_TABLET | Freq: Once | ORAL | Status: DC | PRN

## 2017-09-22 MED ORDER — LIDOCAINE HCL (PF) 1 % IJ SOLN
INTRAMUSCULAR | Status: AC
  Filled 2017-09-22: qty 30

## 2017-09-22 MED ORDER — FENTANYL CITRATE (PF) 100 MCG/2ML IJ SOLN: 25.0000 ug | INTRAMUSCULAR | Status: DC | PRN

## 2017-09-22 MED ORDER — BUPIVACAINE-EPINEPHRINE (PF) 0.5% -1:200000 IJ SOLN
INTRAMUSCULAR | Status: DC | PRN
  Administered 2017-09-22: 10 mL

## 2017-09-22 MED ORDER — BUPIVACAINE-EPINEPHRINE (PF) 0.5% -1:200000 IJ SOLN
INTRAMUSCULAR | Status: AC
  Filled 2017-09-22: qty 30

## 2017-09-22 MED ORDER — LACTATED RINGERS IV SOLN
INTRAVENOUS | Status: DC
  Administered 2017-09-22: 1000 mL via INTRAVENOUS
  Administered 2017-09-22: 11:00:00 via INTRAVENOUS

## 2017-09-22 MED ORDER — SODIUM CHLORIDE 0.9 % IR SOLN
Status: DC | PRN
  Administered 2017-09-22: 3000 mL
  Administered 2017-09-22: 1000 mL

## 2017-09-22 MED ORDER — BACITRACIN-NEOMYCIN-POLYMYXIN 400-5-5000 EX OINT
TOPICAL_OINTMENT | CUTANEOUS | Status: DC | PRN
  Administered 2017-09-22: 1 via TOPICAL

## 2017-09-22 MED ORDER — BACITRACIN-NEOMYCIN-POLYMYXIN 400-5-5000 EX OINT: 1.0000 "application " | TOPICAL_OINTMENT | Freq: Two times a day (BID) | CUTANEOUS | 99 refills | Status: AC

## 2017-09-22 MED ORDER — BACITRACIN-NEOMYCIN-POLYMYXIN 400-5-5000 EX OINT
TOPICAL_OINTMENT | CUTANEOUS | Status: AC
  Filled 2017-09-22: qty 3

## 2017-09-22 MED ORDER — FENTANYL CITRATE (PF) 250 MCG/5ML IJ SOLN
INTRAMUSCULAR | Status: AC
  Filled 2017-09-22: qty 5

## 2017-09-22 MED ORDER — LIDOCAINE HCL (CARDIAC) PF 50 MG/5ML IV SOSY
PREFILLED_SYRINGE | INTRAVENOUS | Status: DC | PRN
  Administered 2017-09-22: 30 mg via INTRAVENOUS

## 2017-09-22 MED ORDER — MIDAZOLAM HCL 5 MG/5ML IJ SOLN
INTRAMUSCULAR | Status: DC | PRN
  Administered 2017-09-22: 2 mg via INTRAVENOUS

## 2017-09-22 SURGICAL SUPPLY — 33 items
BANDAGE STRIP 1X3 FLEXIBLE (GAUZE/BANDAGES/DRESSINGS) ×12 IMPLANT
CLOSURE WOUND 1/4 X3 (GAUZE/BANDAGES/DRESSINGS) ×1
CLOTH BEACON ORANGE TIMEOUT ST (SAFETY) ×3 IMPLANT
COVER LIGHT HANDLE STERIS (MISCELLANEOUS) ×6 IMPLANT
COVER MAYO STAND XLG (DRAPE) ×3 IMPLANT
DECANTER SPIKE VIAL GLASS SM (MISCELLANEOUS) ×6 IMPLANT
DRSG TELFA 3X8 NADH (GAUZE/BANDAGES/DRESSINGS) ×6 IMPLANT
ELECT REM PT RETURN 9FT ADLT (ELECTROSURGICAL)
ELECTRODE REM PT RTRN 9FT ADLT (ELECTROSURGICAL) IMPLANT
GAUZE SPONGE 4X4 12PLY STRL (GAUZE/BANDAGES/DRESSINGS) ×3 IMPLANT
GLOVE BIOGEL PI IND STRL 7.0 (GLOVE) ×2 IMPLANT
GLOVE BIOGEL PI IND STRL 9 (GLOVE) ×1 IMPLANT
GLOVE BIOGEL PI INDICATOR 7.0 (GLOVE) ×4
GLOVE BIOGEL PI INDICATOR 9 (GLOVE) ×2
GLOVE ECLIPSE 6.5 STRL STRAW (GLOVE) ×6 IMPLANT
GLOVE ECLIPSE 9.0 STRL (GLOVE) ×6 IMPLANT
GOWN SPEC L3 XXLG W/TWL (GOWN DISPOSABLE) ×6 IMPLANT
GOWN STRL REUS W/TWL LRG LVL3 (GOWN DISPOSABLE) ×3 IMPLANT
INST SET HYSTEROSCOPY (KITS) ×3 IMPLANT
IV NS IRRIG 3000ML ARTHROMATIC (IV SOLUTION) ×3 IMPLANT
KIT TURNOVER KIT A (KITS) ×3 IMPLANT
MANIFOLD NEPTUNE II (INSTRUMENTS) ×3 IMPLANT
NS IRRIG 1000ML POUR BTL (IV SOLUTION) ×3 IMPLANT
PACK PERI GYN (CUSTOM PROCEDURE TRAY) ×3 IMPLANT
PAD ARMBOARD 7.5X6 YLW CONV (MISCELLANEOUS) ×3 IMPLANT
PAD TELFA 3X4 1S STER (GAUZE/BANDAGES/DRESSINGS) ×9 IMPLANT
PENCIL HANDSWITCHING (ELECTRODE) ×3 IMPLANT
SET BASIN LINEN APH (SET/KITS/TRAYS/PACK) ×3 IMPLANT
SET CYSTO W/LG BORE CLAMP LF (SET/KITS/TRAYS/PACK) ×3 IMPLANT
STRIP CLOSURE SKIN 1/4X3 (GAUZE/BANDAGES/DRESSINGS) ×2 IMPLANT
SYR CONTROL 10ML LL (SYRINGE) ×6 IMPLANT
TAPE PAPER 2X10 WHT MICROPORE (GAUZE/BANDAGES/DRESSINGS) ×3 IMPLANT
TOWEL OR 17X26 4PK STRL BLUE (TOWEL DISPOSABLE) ×3 IMPLANT

## 2017-09-22 NOTE — Progress Notes (Signed)
  September 22, 2017  Patient: Julia [name]  Date of Birth: 07-06-1960  Date of Visit:  09/22/2017    To Whom It May Concern:  Julia [name] was seen and treated in our surgical department on 09/22/2017. Julia [name] can return to work on 09/28/2017 per MD.  Sincerely,  Short Stay Staff

## 2017-09-22 NOTE — Anesthesia Procedure Notes (Signed)
Procedure Name: LMA Insertion Date/Time: 09/22/2017 9:54 AM Performed by: Ollen Bowl, CRNA Pre-anesthesia Checklist: Patient identified, Patient being monitored, Emergency Drugs available, Timeout performed and Suction available Patient Re-evaluated:Patient Re-evaluated prior to induction Oxygen Delivery Method: Circle System Utilized Preoxygenation: Pre-oxygenation with 100% oxygen Induction Type: IV induction Ventilation: Mask ventilation without difficulty LMA: LMA inserted LMA Size: 3.0 Number of attempts: 1 Placement Confirmation: positive ETCO2 and breath sounds checked- equal and bilateral

## 2017-09-22 NOTE — H&P (Signed)
Preoperative History and Physical  Julia [name] is a 57 y.o. G3P3 here for surgical management of endometrial polyp.She would like to have some skin tags removed specifically around her face, neck, and . 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 She doesn't take linzess anymore, she takes hydrochlorothiazide for her HTN.  No significant preoperative concerns.  Proposed surgery: Hysteroscopy and D&C, removal of endometrial polyp 2. Excision of multiple facial and neck polyps 3 excision vulvar lesion 4 excision right thigh lesion      Past Medical History:  Diagnosis Date  . Anxiety   . Brain tumor (benign) ([place])   . Chronic pain   . Elevated cholesterol   . Headache(784.0)   . Hypertension   . Numerous moles   . Trichimoniasis         Past Surgical History:  Procedure Laterality Date  . TUBAL LIGATION                     OB History  Gravida Para Term Preterm AB Living  3 3       3   SAB TAB Ectopic Multiple Live Births                     # Outcome Date GA Lbr Len/2nd Weight Sex Delivery Anes PTL Lv  3 Para      Vag-Spont     2 Para      Vag-Spont     1 Para      Vag-Spont     Patient denies any other pertinent gynecologic issues.         Current Outpatient Medications on File Prior to Visit  Medication Sig Dispense Refill  . ALPRAZolam (XANAX) 1 MG tablet Take 1 mg by mouth 4 (four) times daily as needed for anxiety.     Marland Kitchen amitriptyline (ELAVIL) 25 MG tablet Take 25 mg by mouth at bedtime.     Marland Kitchen amLODipine (NORVASC) 10 MG tablet     . ferrous sulfate 325 (65 FE) MG tablet Take 325 mg by mouth at bedtime.    . hydrochlorothiazide (HYDRODIURIL) 12.5 MG tablet Take 1 tablet by mouth daily.    Marland Kitchen losartan (COZAAR) 50 MG tablet 50 mg daily.     Marland Kitchen oxyCODONE-acetaminophen (PERCOCET/ROXICET) 5-325 MG tablet Take 1 tablet by mouth 4 (four) times daily.     Marland Kitchen PARoxetine (PAXIL) 20 MG tablet Take 20 mg by mouth at bedtime.    . potassium chloride (K-DUR) 10 MEQ tablet Take 1 tablet (10 mEq total) by mouth 2 (two) times daily. 10 tablet 0  . pravastatin (PRAVACHOL) 40 MG tablet Take 40 mg by mouth daily.    Marland Kitchen topiramate (TOPAMAX) 100 MG tablet Take 100 mg by mouth daily.     . butalbital-acetaminophen-caffeine (FIORICET, ESGIC) 50-325-40 MG tablet     . ezetimibe (ZETIA) 10 MG tablet Take 10 mg by mouth daily.     . Linaclotide (LINZESS) 145 MCG CAPS 1 PO 30 mins prior to your first meal (Patient not taking: Reported on 08/24/2017) 30 capsule 11  . megestrol (MEGACE) 40 MG tablet Take 1 tablet (40 mg total) by mouth 3 (three) times daily. Til bleeding stops then once daily (Patient not taking: Reported on 08/24/2017) 45 tablet 2  . valACYclovir (VALTREX) 1000 MG tablet TAKE (1) TABLET BY MOUTH ONCE DAILY. (Patient not taking: Reported on 08/24/2017) 30 tablet  1   No current facility-administered medications on file prior to visit.         Allergies  Allergen Reactions  . Naprosyn [Naproxen] Nausea Only  . Penicillins Nausea And Vomiting    Has patient had a PCN reaction causing immediate rash, facial/tongue/throat swelling, SOB or lightheadedness with hypotension: No Has patient had a PCN reaction causing severe rash involving mucus membranes or skin necrosis: No Has patient had a PCN reaction that required hospitalization Yes Has patient had a PCN reaction occurring within the last 10 years: No If all of the above answers are "NO", then may proceed with Cephalosporin use.   . Promethazine Hcl Nausea Only    Social History:   reports that she has quit smoking. Her smoking use included cigarettes. She has never used smokeless tobacco. She reports that she does not drink alcohol or use drugs.       Family History  Problem Relation Age of Onset  . Stroke Mother   . Heart failure Mother   . Cancer Father        lung  .  Diabetes Brother     Review of Systems: Noncontributory  PHYSICAL EXAM: Blood pressure 119/80, pulse 82, height 5\' 1"  (1.549 m), weight 138 lb 9.6 oz (62.9 kg), last menstrual period 09/06/2012. General appearance - alert, well appearing, and in no distress Chest - clear to auscultation, no wheezes, rales or rhonchi, symmetric air entry Heart - normal rate and regular rhythm Abdomen - soft, nontender, nondistended, no masses or organomegaly   Has keratin horne on right inner thigh  Pelvic exam:  VULVA: normal VAGINA: skin tag at entrance, CERVIX: normal UTERUS: first degree descensus denies problems when passing bowel movements   Extremities - peripheral pulses normal, no pedal edema, no clubbing or cyanosis  Labs: CMP     Component Value Date/Time   NA 142 09/17/2017 1323   K 2.9 (L) 09/17/2017 1323   CL 108 09/17/2017 1323   CO2 27 09/17/2017 1323   GLUCOSE 93 09/17/2017 1323   BUN 11 09/17/2017 1323   CREATININE 0.79 09/17/2017 1323   CALCIUM 10.1 09/17/2017 1323   PROT 7.0 09/17/2017 1323   ALBUMIN 4.0 09/17/2017 1323   AST 47 (H) 09/17/2017 1323   ALT 37 09/17/2017 1323   ALKPHOS 111 09/17/2017 1323   BILITOT 1.3 (H) 09/17/2017 1323   GFRNONAA >60 09/17/2017 1323   GFRAA >60 09/17/2017 1323   CBC    Component Value Date/Time   WBC 5.5 09/17/2017 1323   RBC 4.30 09/17/2017 1323   HGB 13.1 09/17/2017 1323   HCT 38.6 09/17/2017 1323   PLT 212 09/17/2017 1323   MCV 89.8 09/17/2017 1323   MCH 30.5 09/17/2017 1323   MCHC 33.9 09/17/2017 1323   RDW 14.3 09/17/2017 1323   LYMPHSABS 2.1 09/17/2017 1323   MONOABS 0.4 09/17/2017 1323   EOSABS 0.1 09/17/2017 1323   BASOSABS 0.0 09/17/2017 1323    Imaging Studies:  GYNECOLOGIC SONOGRAM   Julia [name] is a 57 y.o. G3P3, she is here for a pelvic sonogram for postmenopausal bleeding.  Uterus                      8.4 x 4.4 x 4.6 cm,vol 90 ml,  heterogeneous retroverted uterus  w/mult.fibroids  Endometrium          16.9 mm, symmetrical, heterogeneous thickened endometrium w/color flow  Right ovary  1.7 x 1.5 x 1.7 cm, wnl  Left ovary                2.5 x 1.5 x 1.5 cm, wnl  Fibroids                   (#1) subserosal posterior left fibroid 1.8 x 1.6 x 1.7 cm,(#2) intramural mid right 1 x  .9 x 1.1 cm  simple nabothian cyst 1.3 x .9 x .9 cm,no free fluid  Technician Comments:  PELVIC US TA/TV: heterogeneous retroverted uterus w/mult.fibroids,(#1) subserosal posterior left fibroid 1.8 x 1.6 x 1.7 cm,(#2) intramural mid right 1 x  .9 x 1.1 cm,heterogeneous thickened endometrium w/color flow 16.9 mm,normal ovaries bilat,simple nabothian cyst 1.3 x .9 x .9 cm,no free fluid,no pain during ultrasound,ovaries appear mobile    [employer] 06/17/2017 11:30 AM  Clinical Impression and recommendations:  I have reviewed the sonogram results above.  Combined with the patient's current clinical course, below are my impressions and any appropriate recommendations for management based on the sonographic findings:  1.  Thickened endometrium in clinical setting of postmenopausal bleeding, endometrial biopsy or D&C recommended 2.  Normal ovaries bilaterally  Jonnie Kind  Assessment:     Patient Active Problem List   Diagnosis Date Noted  . Thickened endometrium 06/17/2017  . Screening for colorectal cancer 06/09/2017  . Encounter for gynecological examination with Papanicolaou smear of cervix 06/09/2017  . PMB (postmenopausal bleeding) 06/09/2017  . Constipation - functional 09/30/2012  . Colon cancer screening 09/30/2012  . Trichomoniasis of vagina 08/31/2012  . Pelvic relaxation due to cystocele, midline 06/02/2012  . Rectocele 06/02/2012    Plan: Patient will undergo surgical management with 1 hysteroscopy, dilation and curettage removal endometrial polyp.  2. Excision multiple skin lesions face and neck. 3. Excision vulvar  skin lesion 4  Excision right thigh skin lesion    Jonnie Kind, MD

## 2017-09-22 NOTE — Op Note (Signed)
Please see the brief op note for details.

## 2017-09-22 NOTE — Interval H&P Note (Signed)
History and Physical Interval Note:  09/22/2017 9:42 AM  Julia [name]  has presented today for surgery, with the diagnosis of Postmenopausal bleeding endometrial polyp multiple skin tags  The various methods of treatment have been discussed with the patient and family. After consideration of risks, benefits and other options for treatment, the patient has consented to  Procedure(s): DILATATION AND CURETTAGE /HYSTEROSCOPY (N/A) REMOVAL OF ENDOMETRIAL POLYP (N/A) EXCISION OF SKIN TAGS ON FACE,NECK,VULVAR, AND THIGH (N/A) as a surgical intervention .  The patient's history has been reviewed, patient examined, no change in status, stable for surgery.  I have reviewed the patient's chart and labs.  Questions were answered to the patient's satisfaction.  I have used a skin marker to mark 30 or more of the more prominent skin tags on the face , neck and thigh that she wants removed.  I will not remove the ones close to the eye, and will ask her to see a dermatologist for followup for the flat lesions and periorbital lesions.   Jonnie Kind

## 2017-09-22 NOTE — Brief Op Note (Signed)
09/22/2017  11:55 AM  PATIENT:  Julia [name]  57 y.o. female  PRE-OPERATIVE DIAGNOSIS:  Postmenopausal bleeding endometrial polyp multiple skin tags  POST-OPERATIVE DIAGNOSIS:  Postmenopausal bleeding endometrial polyp multiple skin tags  PROCEDURE:  Procedure(s): DILATATION AND CURETTAGE /HYSTEROSCOPY; EXCISION OF VULVAR AND RIGHT AND LEFT THIGH SKIN TAGS (Procedure #1) (N/A) REMOVAL OF ENDOMETRIAL POLYP (Procedure #1) (N/A) EXCISION OF SKIN TAGS ON FACE AND NECK (Procedure #2) (N/A)  SURGEON:  Surgeon(s) and Role:    Jonnie Kind, MD - Primary  PHYSICIAN ASSISTANT:   ASSISTANTS: Zoila Shutter, CST  ANESTHESIA:   local and general  EBL:  20 mL   BLOOD ADMINISTERED:none  DRAINS: none   LOCAL MEDICATIONS USED:  LIDOCAINE less than 10 cc  SPECIMEN:  Source of Specimen:  Large endometrial polyp from the endometrial cavity, and multiple skin tags and 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   Delay start of Pharmacological VTE agent (>24hrs) due to surgical blood loss or risk of bleeding: not applicable Details of procedure: Patient was taken the operating room prepped and draped for vaginal procedure, and timeout conducted explaining the procedure procedure planned which had several parts including hysteroscopy with removal of endometrial polyp as well as excision of extensive skin tags from thigh as well as neck and head Details of procedure: Patient was in low lithotomy support position, and after completion of timeout speculum was inserted cervix grasped with single-tooth tenaculum and the cervix found already be dilated to 23 [place] as we attempted serial dilation.  This is suspected to the size of  the polyp.  Hysteroscopy was performed.  There was some loss of fluid through the cervix due to the floppy over dilated cervix.  There was huge endometrial polyp which originated from the anterior fundal portion of the uterus.  This could be grasped with Randall stone forceps twisted and taken out in segments.  Repeat hysteroscopy helped identify the base of the polyp once the majority of it been removed and then we were able to snip off the remaining polyp remnants from its base the room remaining hysteroscopic visualization the endometrial cavity showed no endometrial tissue, both tubal ostia were visible and there was no suspicion of perforation.  The previously prepped right inner thigh skin tag was then sharply excised using needle-tipped Bovie cautery at low setting of 15 and 15 similarly a skin tag on the left anterior thigh was also trimmed Drapes were removed patient reposition legs put down in fine position and attention directed the patient's neck.  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  Approximately 100 of the small condylomatous appearing warty growths were sharply trimmed and a shave technique from the base of the neck and upper chest, extending from the hairline posteriorly around the neck to the level of the chin. There were several on the right side of the face which were then trimmed.  Similar shave technique was used throughout in the 6-10 removed from the right side of the face were sent as a separate specimen We then redraped the patient's left side  of the neck and face.  The largest of the warty growths on the patient's left side of the cheek were infiltrated and raised using Marcaine.  Similarly a large smooth lesion on the forehead was injected with lidocaine to raise it up so that by shave technique to be used to excise the suspected benign  growth Of note, there is an area of hardened reddened 1 cm area just on the cheek above the maxilla on the left side was noted.  This is probably represents a precancerous lesion and the patient will be referred to dermatology for this The left neck area was then addressed and treated similarly the right side and probably 50 or 60 of the small condylomatous lesions were removed from the base of the leg neck.  Most of these had minimal bleeding and could be treated with simple pressure.  A few of them required needle-tipped point cautery to achieve hemostasis.  This was done with the cautery at a very low setting successfully the areas of the neck and face were treated with Steri-Strips across the facial lesions, then Band-Aids applied.  The neck areas were treated with topical Neosporin, Telfa dressing and tape over the entire area regimen counts were correct and patient to recovery room in stable condition

## 2017-09-22 NOTE — Anesthesia Postprocedure Evaluation (Signed)
Anesthesia Post Note  Patient: Julia [name]  Procedure(s) Performed: DILATATION AND CURETTAGE /HYSTEROSCOPY; EXCISION OF VULVAR AND RIGHT AND LEFT THIGH SKIN TAGS (Procedure #1) (N/A ) REMOVAL OF ENDOMETRIAL POLYP (Procedure #1) (N/A ) EXCISION OF SKIN TAGS ON FACE AND NECK (Procedure #2) (N/A )  Patient location during evaluation: PACU Anesthesia Type: General Level of consciousness: awake and alert and oriented Pain management: pain level controlled Respiratory status: spontaneous breathing Cardiovascular status: blood pressure returned to baseline and stable Postop Assessment: no apparent nausea or vomiting Anesthetic complications: no     Last Vitals:  Vitals:   09/22/17 1230 09/22/17 1235  BP: 114/72 124/73  Pulse: 82 84  Resp: 14 16  Temp:  36.5 C  SpO2: 93% 93%    Last Pain:  Vitals:   09/22/17 1235  TempSrc: Oral  PainSc: 0-No pain                 Arionna Hoggard

## 2017-09-22 NOTE — Anesthesia Preprocedure Evaluation (Signed)
Anesthesia Evaluation  Patient identified by MRN, date of birth, ID band Patient awake    Reviewed: Allergy & Precautions, NPO status , Patient's Chart, lab work & pertinent test results  Airway Mallampati: II  TM Distance: >3 FB Neck ROM: Full    Dental no notable dental hx. (+) Edentulous Upper Poor dentition on the lower :   Pulmonary neg pulmonary ROS, former smoker,    Pulmonary exam normal breath sounds clear to auscultation       Cardiovascular Exercise Tolerance: Poor hypertension, Pt. on medications negative cardio ROS Normal cardiovascular examII Rhythm:Regular Rate:Normal     Neuro/Psych  Headaches, Anxiety negative neurological ROS  negative psych ROS   GI/Hepatic negative GI ROS, Neg liver ROS,   Endo/Other  negative endocrine ROS  Renal/GU negative Renal ROS  negative genitourinary   Musculoskeletal negative musculoskeletal ROS (+)   Abdominal   Peds negative pediatric ROS (+)  Hematology negative hematology ROS (+)   Anesthesia Other Findings   Reproductive/Obstetrics negative OB ROS                             Anesthesia Physical Anesthesia Plan  ASA: II  Anesthesia Plan: General   Post-op Pain Management:    Induction: Intravenous  PONV Risk Score and Plan:   Airway Management Planned: LMA  Additional Equipment:   Intra-op Plan:   Post-operative Plan:   Informed Consent: I have reviewed the patients History and Physical, chart, labs and discussed the procedure including the risks, benefits and alternatives for the proposed anesthesia with the patient or authorized representative who has indicated his/her understanding and acceptance.   Dental advisory given  Plan Discussed with: CRNA  Anesthesia Plan Comments:         Anesthesia Quick Evaluation

## 2017-09-22 NOTE — Transfer of Care (Signed)
Immediate Anesthesia Transfer of Care Note  Patient: Julia [name]  Procedure(s) Performed: DILATATION AND CURETTAGE /HYSTEROSCOPY; EXCISION OF VULVAR AND RIGHT AND LEFT THIGH SKIN TAGS (Procedure #1) (N/A ) REMOVAL OF ENDOMETRIAL POLYP (Procedure #1) (N/A ) EXCISION OF SKIN TAGS ON FACE AND NECK (Procedure #2) (N/A )  Patient Location: PACU  Anesthesia Type:General  Level of Consciousness: awake and alert   Airway & Oxygen Therapy: Patient Spontanous Breathing and Patient connected to face mask oxygen  Post-op Assessment: Report given to RN  Post vital signs: Reviewed and stable  Last Vitals:  Vitals Value Taken Time  BP    Temp    Pulse 116 09/22/2017 11:54 AM  Resp    SpO2 92 % 09/22/2017 11:54 AM  Vitals shown include unvalidated device data.  Last Pain:  Vitals:   09/22/17 0805  TempSrc: Oral  PainSc: 0-No pain      Patients Stated Pain Goal: 5 (26/33/35 4562)  Complications: No apparent anesthesia complications

## 2017-09-23 ENCOUNTER — Encounter (HOSPITAL_COMMUNITY): Payer: Self-pay | Admitting: Obstetrics and Gynecology

## 2017-09-25 ENCOUNTER — Telehealth: Payer: Self-pay | Admitting: Obstetrics and Gynecology

## 2017-09-25 NOTE — Telephone Encounter (Signed)
Patient called stating that she had surgery not to long ago and she want to know if it was normal for her to still be bleeding and she would also like to know is she could take a bath. Please contact pt

## 2017-09-25 NOTE — Telephone Encounter (Signed)
Pt called stating that she is having some vaginal bleeding post surgery. Advised that was normal. She asks if she can take a bath. Advised not to sit in a tub but she could shower instead as long as Dr Glo Herring did not tell her differently. Advised to make sure to dry the post op areas after the shower. Pt verbalized understanding.

## 2017-09-28 ENCOUNTER — Ambulatory Visit: Payer: Medicare Other | Admitting: Gastroenterology

## 2017-09-28 ENCOUNTER — Encounter: Payer: Self-pay | Admitting: Gastroenterology

## 2017-09-28 ENCOUNTER — Telehealth: Payer: Self-pay | Admitting: Gastroenterology

## 2017-09-28 NOTE — Telephone Encounter (Signed)
PATIENT WAS A NO SHOW AND LETTER SENT  °

## 2017-09-30 ENCOUNTER — Ambulatory Visit (INDEPENDENT_AMBULATORY_CARE_PROVIDER_SITE_OTHER): Payer: Medicare Other | Admitting: Obstetrics and Gynecology

## 2017-09-30 ENCOUNTER — Encounter: Payer: Self-pay | Admitting: Obstetrics and Gynecology

## 2017-09-30 VITALS — BP 116/71 | HR 80 | Wt 141.0 lb

## 2017-09-30 DIAGNOSIS — Z9889 Other specified postprocedural states: Secondary | ICD-10-CM | POA: Diagnosis not present

## 2017-09-30 DIAGNOSIS — Z09 Encounter for follow-up examination after completed treatment for conditions other than malignant neoplasm: Secondary | ICD-10-CM

## 2017-09-30 NOTE — Progress Notes (Signed)
Patient ID: [name], female   DOB: October 31, 1960, 57 y.o.   MRN: [id number]  Subjective:  Julia [name] is a 57 y.o. female now 1 weeks status post D&C/ Hysteroscopy; Excision of vulval and right and left thigh skin tags, removal of endometrial polyp. Excision of skin tags on face and neck.  Over 100 small skin tags removed from the right side of neck and base of chest, 50+ from the left side of the neck, more than a dozen from the face including several very large ones.  She is healing well is been taking care of the lesions with lots of topical antibiotic creams  Denies vaginal bleeding, as polyp was removed also  Review of Systems Negative except    Diet:   normal   Bowel movements : normal.  The patient is not having any pain.  Objective:  LMP 09/06/2012  General:Well developed, well nourished.  No acute distress. Abdomen: Bowel sounds normal, soft, non-tender.  Pelvic Exam: DEFERRED   Incision(s): N/A Assessment:  Post-Op 1 weeks s/p D&C/ Hysteroscopy; Excision of vulval and right and left thigh skin tags, removal of endometrial polyp. Excision of skin tags on face and neck  Patient very pleased with results Doing well postoperatively.   Plan:  1. Activity restrictions: none 2. return to work: not applicable. 3. Follow up in 1  year or PRN.  By signing my name below, I, Samul Dada, attest that this documentation has been prepared under the direction and in the presence of Jonnie Kind, MD. Electronically Signed: [place]. 09/30/17. 10:18 AM.  I personally performed the services described in this documentation, which was SCRIBED in my presence. The recorded information has been reviewed and considered accurate. It has been edited as necessary during review. Jonnie Kind, MD

## 2017-11-03 DIAGNOSIS — E538 Deficiency of other specified B group vitamins: Secondary | ICD-10-CM | POA: Diagnosis not present

## 2017-11-03 DIAGNOSIS — Z6831 Body mass index (BMI) 31.0-31.9, adult: Secondary | ICD-10-CM | POA: Diagnosis not present

## 2017-11-03 DIAGNOSIS — F329 Major depressive disorder, single episode, unspecified: Secondary | ICD-10-CM | POA: Diagnosis not present

## 2017-11-03 DIAGNOSIS — F419 Anxiety disorder, unspecified: Secondary | ICD-10-CM | POA: Diagnosis not present

## 2017-11-03 DIAGNOSIS — I10 Essential (primary) hypertension: Secondary | ICD-10-CM | POA: Diagnosis not present

## 2017-11-10 DIAGNOSIS — I10 Essential (primary) hypertension: Secondary | ICD-10-CM | POA: Diagnosis not present

## 2017-11-10 DIAGNOSIS — M542 Cervicalgia: Secondary | ICD-10-CM | POA: Diagnosis not present

## 2017-11-10 DIAGNOSIS — D329 Benign neoplasm of meninges, unspecified: Secondary | ICD-10-CM | POA: Diagnosis not present

## 2017-11-10 DIAGNOSIS — G43701 Chronic migraine without aura, not intractable, with status migrainosus: Secondary | ICD-10-CM | POA: Diagnosis not present

## 2017-12-17 ENCOUNTER — Emergency Department (HOSPITAL_COMMUNITY): Payer: Medicare Other

## 2017-12-17 ENCOUNTER — Emergency Department (HOSPITAL_COMMUNITY)
Admission: EM | Admit: 2017-12-17 | Discharge: 2017-12-17 | Disposition: A | Discharge status: Home / Self Care | Payer: Medicare Other | Attending: Emergency Medicine | Admitting: Emergency Medicine

## 2017-12-17 ENCOUNTER — Other Ambulatory Visit: Payer: Self-pay

## 2017-12-17 ENCOUNTER — Encounter (HOSPITAL_COMMUNITY): Payer: Self-pay | Admitting: Emergency Medicine

## 2017-12-17 DIAGNOSIS — Z79899 Other long term (current) drug therapy: Secondary | ICD-10-CM | POA: Diagnosis not present

## 2017-12-17 DIAGNOSIS — Y998 Other external cause status: Secondary | ICD-10-CM | POA: Insufficient documentation

## 2017-12-17 DIAGNOSIS — Y33XXXA Other specified events, undetermined intent, initial encounter: Secondary | ICD-10-CM | POA: Insufficient documentation

## 2017-12-17 DIAGNOSIS — S8392XA Sprain of unspecified site of left knee, initial encounter: Secondary | ICD-10-CM | POA: Diagnosis not present

## 2017-12-17 DIAGNOSIS — I10 Essential (primary) hypertension: Secondary | ICD-10-CM | POA: Insufficient documentation

## 2017-12-17 DIAGNOSIS — E7800 Pure hypercholesterolemia, unspecified: Secondary | ICD-10-CM | POA: Diagnosis not present

## 2017-12-17 DIAGNOSIS — S8992XA Unspecified injury of left lower leg, initial encounter: Secondary | ICD-10-CM | POA: Diagnosis not present

## 2017-12-17 DIAGNOSIS — Y929 Unspecified place or not applicable: Secondary | ICD-10-CM | POA: Diagnosis not present

## 2017-12-17 DIAGNOSIS — Y939 Activity, unspecified: Secondary | ICD-10-CM | POA: Diagnosis not present

## 2017-12-17 DIAGNOSIS — M25562 Pain in left knee: Secondary | ICD-10-CM | POA: Diagnosis not present

## 2017-12-17 MED ORDER — HYDROCODONE-ACETAMINOPHEN 5-325 MG PO TABS
1.0000 | ORAL_TABLET | Freq: Once | ORAL | Status: AC
  Administered 2017-12-17: 1 via ORAL
  Filled 2017-12-17: qty 1

## 2017-12-17 MED ORDER — HYDROCODONE-ACETAMINOPHEN 5-325 MG PO TABS: ORAL_TABLET | ORAL | 0 refills | Status: DC

## 2017-12-17 NOTE — ED Triage Notes (Signed)
Pt reports a fall last night. C/o pain to LT leg. Pt reports difficulty ambulating. No obvious deformity noted.

## 2017-12-17 NOTE — ED Provider Notes (Signed)
[facility] EMERGENCY DEPARTMENT Provider Note   CSN: [id number] Arrival date & time: 12/17/17  1245     History   Chief Complaint Chief Complaint  Patient presents with  . Fall    HPI DARNELLA ZEITER is a 57 y.o. female.  HPI   CALVARY DIFRANCO is a 57 y.o. female who presents to the Emergency Department complaining of left knee pain secondary to a fall that occurred last night.  She describes a twisting injury of the knee.  Initially she was able to bear weight to the affected extremity, but states this morning she now has increased pain associated with weight bearing.  She also complains of swelling to the knee and shooting pain into her lower leg.  She is taken to Tylenol this morning with out relief.  She denies numbness of the extremity, redness, open wounds and other injuries.    Past Medical History:  Diagnosis Date  . Anxiety   . Brain tumor (benign) ([place])   . Chronic pain   . Elevated cholesterol   . Headache(784.0)   . Hypertension   . Numerous moles   . Trichimoniasis     Patient Active Problem List   Diagnosis Date Noted  . Thickened endometrium 06/17/2017  . Screening for colorectal cancer 06/09/2017  . Encounter for gynecological examination with Papanicolaou smear of cervix 06/09/2017  . PMB (postmenopausal bleeding) 06/09/2017  . Constipation - functional 09/30/2012  . Colon cancer screening 09/30/2012  . Trichomoniasis of vagina 08/31/2012  . Pelvic relaxation due to cystocele, midline 06/02/2012  . Rectocele 06/02/2012    Past Surgical History:  Procedure Laterality Date  . EXCISION OF SKIN TAG N/A 09/22/2017   Procedure: EXCISION OF SKIN TAGS ON FACE AND NECK (Procedure #2);  Surgeon: Jonnie Kind, MD;  Location: [facility];  Service: Gynecology;  Laterality: N/A;  . HYSTEROSCOPY W/D&C N/A 09/22/2017   Procedure: DILATATION AND CURETTAGE /HYSTEROSCOPY; EXCISION OF VULVAR AND RIGHT AND LEFT THIGH SKIN TAGS (Procedure #1);  Surgeon: Jonnie Kind, MD;  Location: [facility];  Service: Gynecology;  Laterality: N/A;  . POLYPECTOMY N/A 09/22/2017   Procedure: REMOVAL OF ENDOMETRIAL POLYP (Procedure #1);  Surgeon: Jonnie Kind, MD;  Location: [facility];  Service: Gynecology;  Laterality: N/A;  . TUBAL LIGATION       OB History    Gravida  3   Para  3   Term      Preterm      AB      Living  3     SAB      TAB      Ectopic      Multiple      Live Births               Home Medications    Prior to Admission medications   Medication Sig Start Date End Date Taking? Authorizing Provider  acetaminophen (TYLENOL) 325 MG tablet Take 650 mg by mouth every 6 (six) hours as needed for moderate pain or headache.    [provider]  albuterol (PROVENTIL HFA;VENTOLIN HFA) 108 (90 Base) MCG/ACT inhaler  09/18/17   [provider]  ALPRAZolam Duanne Moron) 1 MG tablet Take 1 mg by mouth 3 (three) times daily as needed for anxiety.     [provider]  amitriptyline (ELAVIL) 25 MG tablet Take 25 mg by mouth at bedtime.  05/22/12   [provider]  amLODipine (NORVASC) 10 MG tablet Take 10  mg by mouth daily.  07/10/17   [provider]  ezetimibe (ZETIA) 10 MG tablet Take 10 mg by mouth daily.  08/15/16   [provider]  HYDROcodone-acetaminophen (NORCO) 7.5-325 MG tablet Take 1-2 tablets by mouth daily as needed (for headache).  07/23/17   [provider]  ibuprofen (ADVIL,MOTRIN) 200 MG tablet Take 200-400 mg by mouth every 6 (six) hours as needed for headache or moderate pain.    [provider]  Linaclotide (LINZESS) 145 MCG CAPS 1 PO 30 mins prior to your first meal Patient not taking: Reported on 08/24/2017 09/30/12   Danie Binder, MD  losartan (COZAAR) 50 MG tablet Take 50 mg by mouth daily.  09/01/13   [provider]  PARoxetine (PAXIL) 30 MG tablet Take 30 mg by mouth at bedtime.  05/22/12   [provider]  potassium chloride (K-DUR) 10 MEQ  tablet Take 1 tablet (10 mEq total) by mouth 2 (two) times daily. Patient not taking: Reported on 09/14/2017 03/10/16   Ward, Delice Bison, DO  pravastatin (PRAVACHOL) 40 MG tablet Take 40 mg by mouth daily. 05/20/12   [provider]  valACYclovir (VALTREX) 1000 MG tablet TAKE (1) TABLET BY MOUTH ONCE DAILY. Patient not taking: Reported on 08/24/2017 08/13/17   Jonnie Kind, MD    Family History Family History  Problem Relation Age of Onset  . Stroke Mother   . Heart failure Mother   . Cancer Father        lung  . Diabetes Brother     Social History Social History   Tobacco Use  . Smoking status: Never Smoker  . Smokeless tobacco: Never Used  Substance Use Topics  . Alcohol use: No  . Drug use: No     Allergies   Naprosyn [naproxen]; Penicillins; and Promethazine hcl   Review of Systems Review of Systems  Constitutional: Negative for chills and fever.  Respiratory: Negative for shortness of breath.   Gastrointestinal: Negative for nausea and vomiting.  Musculoskeletal: Positive for arthralgias (Left knee pain) and joint swelling. Negative for back pain and neck pain.  Skin: Negative for color change and wound.  Neurological: Negative for syncope, weakness, numbness and headaches.     Physical Exam Updated Vital Signs BP 115/71 (BP Location: Right Arm)   Pulse 76   Temp 98.6 F (37 C) (Oral)   Resp 18   Ht 5' (1.524 m)   Wt 63.5 kg   LMP 09/06/2012   SpO2 98%   BMI 27.34 kg/m   Physical Exam  Constitutional: She appears well-developed and well-nourished. No distress.  HENT:  Head: Atraumatic.  Neck: Normal range of motion.  Cardiovascular: Normal rate, regular rhythm and intact distal pulses.  Pulmonary/Chest: Effort normal and breath sounds normal. No respiratory distress. She exhibits no tenderness.  Musculoskeletal: She exhibits tenderness. She exhibits no edema or deformity.  Diffuse tenderness to palpation of the posterior and medial left knee.   No bony deformity.  Pain with valgus and varus stress, negative drawer sign.  Left calf is soft and nontender  Neurological: She is alert. No sensory deficit.  Skin: Skin is warm. Capillary refill takes less than 2 seconds. No erythema.  Psychiatric: She has a normal mood and affect.  Nursing note and vitals reviewed.    ED Treatments / Results  Labs (all labs ordered are listed, but only abnormal results are displayed) Labs Reviewed - No data to display  EKG None  Radiology Dg  Knee Complete 4 Views Left  Result Date: 12/17/2017 CLINICAL DATA:  Generalized left knee pain after twisting injury. EXAM: LEFT KNEE - COMPLETE 4+ VIEW COMPARISON:  03/22/2013 FINDINGS: No evidence of fracture, or dislocation. The evaluation for joint effusion is limited due to positioning. No evidence of arthropathy or other focal bone abnormality. Mild soft tissue swelling. IMPRESSION: No acute fracture or dislocation identified about the left knee. Electronically Signed   By: Fidela Salisbury M.D.   On: 12/17/2017 10:03    Procedures Procedures (including critical care time)  Medications Ordered in ED Medications  HYDROcodone-acetaminophen (NORCO/VICODIN) 5-325 MG per tablet 1 tablet (has no administration in time range)     Initial Impression / Assessment and Plan / ED Course  I have reviewed the triage vital signs and the nursing notes.  Pertinent labs & imaging results that were available during my care of the patient were reviewed by me and considered in my medical decision making (see chart for details).     Patient with likely strain of left knee.  X-ray negative for acute bony injury.  Neurovascularly intact. Knee sleeve applied and crutches given, patient agrees to RICE therapy and close orthopedic follow-up in 1 week if not improving.  Final Clinical Impressions(s) / ED Diagnoses   Final diagnoses:  Sprain of left knee, unspecified ligament, initial encounter    ED Discharge  Orders    None       Kem Parkinson, PA-C 12/17/17 1047    Milton Ferguson, MD 12/18/17 1011

## 2017-12-17 NOTE — Discharge Instructions (Addendum)
Elevate and apply ice packs on and off to your knee.  Use the crutches as needed for weightbearing.  Wear the knee brace as needed for support, but do not wear continuously or at bedtime.  Call Dr. Ruthe Mannan office in 1 week to arrange a follow-up appointment if not improving

## 2017-12-23 ENCOUNTER — Ambulatory Visit (INDEPENDENT_AMBULATORY_CARE_PROVIDER_SITE_OTHER): Payer: Medicare Other | Admitting: Orthopaedic Surgery

## 2017-12-23 ENCOUNTER — Encounter: Payer: Self-pay | Admitting: Orthopaedic Surgery

## 2017-12-23 VITALS — BP 122/81 | HR 79 | Ht 61.0 in | Wt 138.0 lb

## 2017-12-23 DIAGNOSIS — M25562 Pain in left knee: Secondary | ICD-10-CM

## 2017-12-23 MED ORDER — HYDROCODONE-ACETAMINOPHEN 5-325 MG PO TABS: ORAL_TABLET | ORAL | 0 refills | Status: DC

## 2017-12-23 NOTE — Progress Notes (Signed)
Subjective:    Patient ID: Julia [name], female    DOB: [birth year], 57 y.o.   MRN: [id number]  HPI She fell and hurt her left knee a week ago today.  She was seen in the ER.  X-rays were negative.  She continues to have knee pain.  Her swelling [name] decreased but she still [name] some.  She [name] no giving way.  She [name] been taking her medicine.  She [name] no other problem.  I have reviewed the ER report, x-rays and x-ray report.   Review of Systems  Constitutional: Positive for activity change.  Musculoskeletal: Positive for arthralgias, gait problem and joint swelling.  Neurological: Positive for headaches.  Psychiatric/Behavioral: The patient is nervous/anxious.   All other systems reviewed and are negative.  For Review of Systems, all other systems reviewed and are negative.  The following is a summary of the past history medically, past history surgically, known current medicines, social history and family history.  This information is gathered electronically by the computer from prior information and documentation.  I review this each visit and have found including this information at this point in the chart is beneficial and informative.   Past Medical History:  Diagnosis Date  . Anxiety   . Brain tumor (benign) ([place])   . Chronic pain   . Elevated cholesterol   . Headache(784.0)   . Hypertension   . Numerous moles   . Trichimoniasis     Past Surgical History:  Procedure Laterality Date  . EXCISION OF SKIN TAG N/A 09/22/2017   Procedure: EXCISION OF SKIN TAGS ON FACE AND NECK (Procedure #2);  Surgeon: Jonnie Kind, MD;  Location: [facility];  Service: Gynecology;  Laterality: N/A;  . HYSTEROSCOPY W/D&C N/A 09/22/2017   Procedure: DILATATION AND CURETTAGE /HYSTEROSCOPY; EXCISION OF VULVAR AND RIGHT AND LEFT THIGH SKIN TAGS (Procedure #1);  Surgeon: Jonnie Kind, MD;  Location: [facility];  Service: Gynecology;  Laterality: N/A;  . POLYPECTOMY N/A 09/22/2017   Procedure:  REMOVAL OF ENDOMETRIAL POLYP (Procedure #1);  Surgeon: Jonnie Kind, MD;  Location: [facility];  Service: Gynecology;  Laterality: N/A;  . TUBAL LIGATION      Current Outpatient Medications on File Prior to Visit  Medication Sig Dispense Refill  . acetaminophen (TYLENOL) 325 MG tablet Take 650 mg by mouth every 6 (six) hours as needed for moderate pain or headache.    . albuterol (PROVENTIL HFA;VENTOLIN HFA) 108 (90 Base) MCG/ACT inhaler     . ALPRAZolam (XANAX) 1 MG tablet Take 1 mg by mouth 3 (three) times daily as needed for anxiety.     Marland Kitchen amitriptyline (ELAVIL) 25 MG tablet Take 25 mg by mouth at bedtime.     Marland Kitchen amLODipine (NORVASC) 10 MG tablet Take 10 mg by mouth daily.     Marland Kitchen ezetimibe (ZETIA) 10 MG tablet Take 10 mg by mouth daily.     Marland Kitchen ibuprofen (ADVIL,MOTRIN) 200 MG tablet Take 200-400 mg by mouth every 6 (six) hours as needed for headache or moderate pain.    . Linaclotide (LINZESS) 145 MCG CAPS 1 PO 30 mins prior to your first meal (Patient not taking: Reported on 08/24/2017) 30 capsule 11  . losartan (COZAAR) 50 MG tablet Take 50 mg by mouth daily.     Marland Kitchen PARoxetine (PAXIL) 30 MG tablet Take 30 mg by mouth at bedtime.     . potassium chloride (K-DUR) 10 MEQ tablet Take 1 tablet (10 mEq total) by  mouth 2 (two) times daily. (Patient not taking: Reported on 09/14/2017) 10 tablet 0  . pravastatin (PRAVACHOL) 40 MG tablet Take 40 mg by mouth daily.    . valACYclovir (VALTREX) 1000 MG tablet TAKE (1) TABLET BY MOUTH ONCE DAILY. (Patient not taking: Reported on 08/24/2017) 30 tablet 1   No current facility-administered medications on file prior to visit.     Social History   Socioeconomic History  . Marital status: Widowed    Spouse name: Not on file  . Number of children: Not on file  . Years of education: Not on file  . Highest education level: Not on file  Occupational History  . Not on file  Social Needs  . Financial resource strain: Not on file  . Food insecurity:    Worry:  Not on file    Inability: Not on file  . Transportation needs:    Medical: Not on file    Non-medical: Not on file  Tobacco Use  . Smoking status: Never Smoker  . Smokeless tobacco: Never Used  Substance and Sexual Activity  . Alcohol use: No  . Drug use: No  . Sexual activity: Not Currently    Birth control/protection: Post-menopausal, Surgical    Comment: tubal  Lifestyle  . Physical activity:    Days per week: Not on file    Minutes per session: Not on file  . Stress: Not on file  Relationships  . Social connections:    Talks on phone: Not on file    Gets together: Not on file    Attends religious service: Not on file    Active member of club or organization: Not on file    Attends meetings of clubs or organizations: Not on file    Relationship status: Not on file  . Intimate partner violence:    Fear of current or ex partner: Not on file    Emotionally abused: Not on file    Physically abused: Not on file    Forced sexual activity: Not on file  Other Topics Concern  . Not on file  Social History Narrative  . Not on file    Family History  Problem Relation Age of Onset  . Stroke Mother   . Heart failure Mother   . Cancer Father        lung  . Diabetes Brother     BP 122/81   Pulse 79   Ht 5\' 1"  (1.549 m)   Wt 138 lb (62.6 kg)   LMP 09/06/2012   BMI 26.07 kg/m   Body mass index is 26.07 kg/m.     Objective:   Physical Exam  Constitutional: She is oriented to person, place, and time. She appears well-developed and well-nourished.  HENT:  Head: Normocephalic and atraumatic.  Eyes: Pupils are equal, round, and reactive to light. Conjunctivae and EOM are normal.  Neck: Normal range of motion. Neck supple.  Cardiovascular: Normal rate, regular rhythm and intact distal pulses.  Pulmonary/Chest: Effort normal.  Abdominal: Soft.  Musculoskeletal:       Left knee: She exhibits swelling and effusion. Tenderness found. Medial joint line tenderness noted.         Legs: Neurological: She is alert and oriented to person, place, and time. She [name] normal reflexes. She displays normal reflexes. No cranial nerve deficit. She exhibits normal muscle tone. Coordination normal.  Skin: Skin is warm and dry.  Psychiatric: She [name] a normal mood and affect. Her behavior is normal. Judgment  and thought content normal.          Assessment & Plan:   Encounter Diagnosis  Name Primary?  . Acute pain of left knee Yes   PROCEDURE NOTE:  The patient requests injections of the left knee , verbal consent was obtained.  The left knee was prepped appropriately after time out was performed.   Sterile technique was observed and injection of 1 cc of Depo-Medrol 40 mg with several cc's of plain xylocaine. Anesthesia was provided by ethyl chloride and a 20-gauge needle was used to inject the knee area. The injection was tolerated well.  A band aid dressing was applied.  The patient was advised to apply ice later today and tomorrow to the injection sight as needed.  I have reviewed the [place] web site prior to prescribing narcotic medicine for this patient.    Return in two weeks.  Call if any problem.  Precautions discussed.   Electronically Signed Sanjuana Kava, MD 10/16/20199:31 AM

## 2018-01-06 ENCOUNTER — Encounter: Payer: Self-pay | Admitting: Orthopaedic Surgery

## 2018-01-06 ENCOUNTER — Ambulatory Visit (INDEPENDENT_AMBULATORY_CARE_PROVIDER_SITE_OTHER): Payer: Medicare Other | Admitting: Orthopaedic Surgery

## 2018-01-06 VITALS — BP 118/83 | HR 122 | Ht 61.0 in | Wt 137.0 lb

## 2018-01-06 DIAGNOSIS — M25562 Pain in left knee: Secondary | ICD-10-CM

## 2018-01-06 MED ORDER — HYDROCODONE-ACETAMINOPHEN 5-325 MG PO TABS: ORAL_TABLET | ORAL | 0 refills | Status: DC

## 2018-01-06 NOTE — Progress Notes (Signed)
Patient Julia [name], female DOB:September 18, 1960, 57 y.o. YDX:[id number]  Chief Complaint  Patient presents with  . Knee Pain    left    HPI  Julia [name] is a 57 y.o. female who has left knee pain.  It is somewhat better but she still has medial pain and some posterior medial pain.  She has no giving way, the swelling is less.  She is taking her medicine and it has helped.     Body mass index is 25.89 kg/m.  ROS  Review of Systems  Constitutional: Positive for activity change.  Musculoskeletal: Positive for arthralgias, gait problem and joint swelling.  Neurological: Positive for headaches.  Psychiatric/Behavioral: The patient is nervous/anxious.   All other systems reviewed and are negative.   All other systems reviewed and are negative.  The following is a summary of the past history medically, past history surgically, known current medicines, social history and family history.  This information is gathered electronically by the computer from prior information and documentation.  I review this each visit and have found including this information at this point in the chart is beneficial and informative.    Past Medical History:  Diagnosis Date  . Anxiety   . Brain tumor (benign) ([place])   . Chronic pain   . Elevated cholesterol   . Headache(784.0)   . Hypertension   . Numerous moles   . Trichimoniasis     Past Surgical History:  Procedure Laterality Date  . EXCISION OF SKIN TAG N/A 09/22/2017   Procedure: EXCISION OF SKIN TAGS ON FACE AND NECK (Procedure #2);  Surgeon: Jonnie Kind, MD;  Location: [facility];  Service: Gynecology;  Laterality: N/A;  . HYSTEROSCOPY W/D&C N/A 09/22/2017   Procedure: DILATATION AND CURETTAGE /HYSTEROSCOPY; EXCISION OF VULVAR AND RIGHT AND LEFT THIGH SKIN TAGS (Procedure #1);  Surgeon: Jonnie Kind, MD;  Location: [facility];  Service: Gynecology;  Laterality: N/A;  . POLYPECTOMY N/A 09/22/2017   Procedure: REMOVAL OF ENDOMETRIAL POLYP  (Procedure #1);  Surgeon: Jonnie Kind, MD;  Location: [facility];  Service: Gynecology;  Laterality: N/A;  . TUBAL LIGATION      Family History  Problem Relation Age of Onset  . Stroke Mother   . Heart failure Mother   . Cancer Father        lung  . Diabetes Brother     Social History Social History   Tobacco Use  . Smoking status: Never Smoker  . Smokeless tobacco: Never Used  Substance Use Topics  . Alcohol use: No  . Drug use: No    Allergies  Allergen Reactions  . Naprosyn [Naproxen] Nausea Only  . Penicillins Nausea And Vomiting and Other (See Comments)    Has patient had a PCN reaction causing immediate rash, facial/tongue/throat swelling, SOB or lightheadedness with hypotension: No Has patient had a PCN reaction causing severe rash involving mucus membranes or skin necrosis: No Has patient had a PCN reaction that required hospitalization Yes Has patient had a PCN reaction occurring within the last 10 years: No If all of the above answers are "NO", then may proceed with Cephalosporin use.   . Promethazine Hcl Nausea Only    Current Outpatient Medications  Medication Sig Dispense Refill  . acetaminophen (TYLENOL) 325 MG tablet Take 650 mg by mouth every 6 (six) hours as needed for moderate pain or headache.    . albuterol (PROVENTIL HFA;VENTOLIN HFA) 108 (90 Base) MCG/ACT inhaler     .  ALPRAZolam (XANAX) 1 MG tablet Take 1 mg by mouth 3 (three) times daily as needed for anxiety.     Marland Kitchen amitriptyline (ELAVIL) 25 MG tablet Take 25 mg by mouth at bedtime.     Marland Kitchen amLODipine (NORVASC) 10 MG tablet Take 10 mg by mouth daily.     Marland Kitchen ezetimibe (ZETIA) 10 MG tablet Take 10 mg by mouth daily.     Marland Kitchen HYDROcodone-acetaminophen (NORCO/VICODIN) 5-325 MG tablet One tablet every six hours for pain.  Limit 7 days. 28 tablet 0  . ibuprofen (ADVIL,MOTRIN) 200 MG tablet Take 200-400 mg by mouth every 6 (six) hours as needed for headache or moderate pain.    . Linaclotide (LINZESS) 145  MCG CAPS 1 PO 30 mins prior to your first meal (Patient not taking: Reported on 08/24/2017) 30 capsule 11  . losartan (COZAAR) 50 MG tablet Take 50 mg by mouth daily.     Marland Kitchen PARoxetine (PAXIL) 30 MG tablet Take 30 mg by mouth at bedtime.     . potassium chloride (K-DUR) 10 MEQ tablet Take 1 tablet (10 mEq total) by mouth 2 (two) times daily. (Patient not taking: Reported on 09/14/2017) 10 tablet 0  . pravastatin (PRAVACHOL) 40 MG tablet Take 40 mg by mouth daily.    . valACYclovir (VALTREX) 1000 MG tablet TAKE (1) TABLET BY MOUTH ONCE DAILY. (Patient not taking: Reported on 08/24/2017) 30 tablet 1   No current facility-administered medications for this visit.      Physical Exam  Blood pressure 118/83, pulse (!) 122, height 5\' 1"  (1.549 m), weight 137 lb (62.1 kg), last menstrual period 09/06/2012.  Constitutional: overall normal hygiene, normal nutrition, well developed, normal grooming, normal body habitus. Assistive device:none  Musculoskeletal: gait and station Limp left, muscle tone and strength are normal, no tremors or atrophy is present.  .  Neurological: coordination overall normal.  Deep tendon reflex/nerve stretch intact.  Sensation normal.  Cranial nerves II-XII intact.   Skin:   Normal overall no scars, lesions, ulcers or rashes. No psoriasis.  Psychiatric: Alert and oriented x 3.  Recent memory intact, remote memory unclear.  Normal mood and affect. Well groomed.  Good eye contact.  Cardiovascular: overall no swelling, no varicosities, no edema bilaterally, normal temperatures of the legs and arms, no clubbing, cyanosis and good capillary refill.  Lymphatic: palpation is normal.  Left knee has slight effusion, crepitus, ROM 0 to 105, medial joint line pain, stable, NV intact.  All other systems reviewed and are negative   The patient has been educated about the nature of the problem(s) and counseled on treatment options.  The patient appeared to understand what I have  discussed and is in agreement with it.  Encounter Diagnosis  Name Primary?  . Acute pain of left knee Yes    PLAN Call if any problems.  Precautions discussed.  Continue current medications.   Return to clinic 3 weeks   I have reviewed the [place] web site prior to prescribing narcotic medicine for this patient.    Electronically Signed Sanjuana Kava, MD 10/30/20192:48 PM

## 2018-01-18 DIAGNOSIS — M542 Cervicalgia: Secondary | ICD-10-CM | POA: Diagnosis not present

## 2018-01-18 DIAGNOSIS — Z79891 Long term (current) use of opiate analgesic: Secondary | ICD-10-CM | POA: Diagnosis not present

## 2018-01-18 DIAGNOSIS — M545 Low back pain: Secondary | ICD-10-CM | POA: Diagnosis not present

## 2018-01-18 DIAGNOSIS — I10 Essential (primary) hypertension: Secondary | ICD-10-CM | POA: Diagnosis not present

## 2018-01-18 DIAGNOSIS — D329 Benign neoplasm of meninges, unspecified: Secondary | ICD-10-CM | POA: Diagnosis not present

## 2018-01-18 DIAGNOSIS — G43701 Chronic migraine without aura, not intractable, with status migrainosus: Secondary | ICD-10-CM | POA: Diagnosis not present

## 2018-01-27 ENCOUNTER — Ambulatory Visit: Payer: Medicare Other | Admitting: Orthopaedic Surgery

## 2018-02-11 DIAGNOSIS — J329 Chronic sinusitis, unspecified: Secondary | ICD-10-CM | POA: Diagnosis not present

## 2018-02-11 DIAGNOSIS — N10 Acute tubulo-interstitial nephritis: Secondary | ICD-10-CM | POA: Diagnosis not present

## 2018-02-11 DIAGNOSIS — Z1389 Encounter for screening for other disorder: Secondary | ICD-10-CM | POA: Diagnosis not present

## 2018-02-11 DIAGNOSIS — Z6827 Body mass index (BMI) 27.0-27.9, adult: Secondary | ICD-10-CM | POA: Diagnosis not present

## 2018-02-11 DIAGNOSIS — R197 Diarrhea, unspecified: Secondary | ICD-10-CM | POA: Diagnosis not present

## 2018-02-11 DIAGNOSIS — I10 Essential (primary) hypertension: Secondary | ICD-10-CM | POA: Diagnosis not present

## 2018-02-11 DIAGNOSIS — E663 Overweight: Secondary | ICD-10-CM | POA: Diagnosis not present

## 2018-02-11 DIAGNOSIS — F419 Anxiety disorder, unspecified: Secondary | ICD-10-CM | POA: Diagnosis not present

## 2018-02-11 DIAGNOSIS — F329 Major depressive disorder, single episode, unspecified: Secondary | ICD-10-CM | POA: Diagnosis not present

## 2018-02-23 ENCOUNTER — Encounter: Payer: Self-pay | Admitting: Gastroenterology

## 2018-03-11 DIAGNOSIS — R1013 Epigastric pain: Secondary | ICD-10-CM | POA: Diagnosis not present

## 2018-03-11 DIAGNOSIS — E663 Overweight: Secondary | ICD-10-CM | POA: Diagnosis not present

## 2018-03-11 DIAGNOSIS — R8290 Unspecified abnormal findings in urine: Secondary | ICD-10-CM | POA: Diagnosis not present

## 2018-03-11 DIAGNOSIS — Z6826 Body mass index (BMI) 26.0-26.9, adult: Secondary | ICD-10-CM | POA: Diagnosis not present

## 2018-03-11 DIAGNOSIS — N10 Acute tubulo-interstitial nephritis: Secondary | ICD-10-CM | POA: Diagnosis not present

## 2018-03-15 DIAGNOSIS — G894 Chronic pain syndrome: Secondary | ICD-10-CM | POA: Diagnosis not present

## 2018-03-15 DIAGNOSIS — G43701 Chronic migraine without aura, not intractable, with status migrainosus: Secondary | ICD-10-CM | POA: Diagnosis not present

## 2018-03-15 DIAGNOSIS — M545 Low back pain: Secondary | ICD-10-CM | POA: Diagnosis not present

## 2018-03-15 DIAGNOSIS — D329 Benign neoplasm of meninges, unspecified: Secondary | ICD-10-CM | POA: Diagnosis not present

## 2018-03-15 DIAGNOSIS — M542 Cervicalgia: Secondary | ICD-10-CM | POA: Diagnosis not present

## 2018-03-31 DIAGNOSIS — Z23 Encounter for immunization: Secondary | ICD-10-CM | POA: Diagnosis not present

## 2018-05-10 NOTE — Telephone Encounter (Signed)
Provider notified

## 2018-05-12 DIAGNOSIS — R39198 Other difficulties with micturition: Secondary | ICD-10-CM | POA: Diagnosis not present

## 2018-05-12 DIAGNOSIS — E663 Overweight: Secondary | ICD-10-CM | POA: Diagnosis not present

## 2018-05-12 DIAGNOSIS — M5136 Other intervertebral disc degeneration, lumbar region: Secondary | ICD-10-CM | POA: Diagnosis not present

## 2018-05-12 DIAGNOSIS — E538 Deficiency of other specified B group vitamins: Secondary | ICD-10-CM | POA: Diagnosis not present

## 2018-05-12 DIAGNOSIS — Z6827 Body mass index (BMI) 27.0-27.9, adult: Secondary | ICD-10-CM | POA: Diagnosis not present

## 2018-05-18 ENCOUNTER — Encounter: Payer: Self-pay | Admitting: Gastroenterology

## 2018-05-18 ENCOUNTER — Ambulatory Visit: Payer: Medicare Other | Admitting: Gastroenterology

## 2018-06-07 ENCOUNTER — Other Ambulatory Visit: Payer: Self-pay | Admitting: Neurology

## 2018-06-07 ENCOUNTER — Other Ambulatory Visit (HOSPITAL_COMMUNITY): Payer: Self-pay | Admitting: Neurology

## 2018-06-07 DIAGNOSIS — Q8500 Neurofibromatosis, unspecified: Secondary | ICD-10-CM

## 2018-06-07 DIAGNOSIS — D329 Benign neoplasm of meninges, unspecified: Secondary | ICD-10-CM | POA: Diagnosis not present

## 2018-06-07 DIAGNOSIS — G43701 Chronic migraine without aura, not intractable, with status migrainosus: Secondary | ICD-10-CM | POA: Diagnosis not present

## 2018-06-07 DIAGNOSIS — D497 Neoplasm of unspecified behavior of endocrine glands and other parts of nervous system: Secondary | ICD-10-CM

## 2018-06-07 DIAGNOSIS — M542 Cervicalgia: Secondary | ICD-10-CM | POA: Diagnosis not present

## 2018-06-07 DIAGNOSIS — I10 Essential (primary) hypertension: Secondary | ICD-10-CM | POA: Diagnosis not present

## 2018-06-16 DIAGNOSIS — E538 Deficiency of other specified B group vitamins: Secondary | ICD-10-CM | POA: Diagnosis not present

## 2018-06-16 DIAGNOSIS — Z6827 Body mass index (BMI) 27.0-27.9, adult: Secondary | ICD-10-CM | POA: Diagnosis not present

## 2018-06-16 DIAGNOSIS — F419 Anxiety disorder, unspecified: Secondary | ICD-10-CM | POA: Diagnosis not present

## 2018-06-16 DIAGNOSIS — Z1389 Encounter for screening for other disorder: Secondary | ICD-10-CM | POA: Diagnosis not present

## 2018-06-16 DIAGNOSIS — E663 Overweight: Secondary | ICD-10-CM | POA: Diagnosis not present

## 2018-06-23 ENCOUNTER — Telehealth: Payer: Self-pay | Admitting: Neurology

## 2018-07-12 ENCOUNTER — Ambulatory Visit (HOSPITAL_COMMUNITY): Payer: Medicare Other

## 2018-07-12 ENCOUNTER — Encounter (HOSPITAL_COMMUNITY): Payer: Self-pay

## 2018-09-01 DIAGNOSIS — G43701 Chronic migraine without aura, not intractable, with status migrainosus: Secondary | ICD-10-CM | POA: Diagnosis not present

## 2018-09-01 DIAGNOSIS — D329 Benign neoplasm of meninges, unspecified: Secondary | ICD-10-CM | POA: Diagnosis not present

## 2018-09-01 DIAGNOSIS — M545 Low back pain: Secondary | ICD-10-CM | POA: Diagnosis not present

## 2018-09-01 DIAGNOSIS — M542 Cervicalgia: Secondary | ICD-10-CM | POA: Diagnosis not present

## 2018-09-01 DIAGNOSIS — Z79891 Long term (current) use of opiate analgesic: Secondary | ICD-10-CM | POA: Diagnosis not present

## 2018-09-01 DIAGNOSIS — I10 Essential (primary) hypertension: Secondary | ICD-10-CM | POA: Diagnosis not present

## 2018-09-29 DIAGNOSIS — I10 Essential (primary) hypertension: Secondary | ICD-10-CM | POA: Diagnosis not present

## 2018-09-29 DIAGNOSIS — Z0000 Encounter for general adult medical examination without abnormal findings: Secondary | ICD-10-CM | POA: Diagnosis not present

## 2018-09-29 DIAGNOSIS — Q8500 Neurofibromatosis, unspecified: Secondary | ICD-10-CM | POA: Diagnosis not present

## 2018-09-29 DIAGNOSIS — E559 Vitamin D deficiency, unspecified: Secondary | ICD-10-CM | POA: Diagnosis not present

## 2018-09-29 DIAGNOSIS — R0789 Other chest pain: Secondary | ICD-10-CM | POA: Diagnosis not present

## 2018-09-29 DIAGNOSIS — R0609 Other forms of dyspnea: Secondary | ICD-10-CM | POA: Diagnosis not present

## 2018-09-29 DIAGNOSIS — G894 Chronic pain syndrome: Secondary | ICD-10-CM | POA: Diagnosis not present

## 2018-09-29 DIAGNOSIS — E663 Overweight: Secondary | ICD-10-CM | POA: Diagnosis not present

## 2018-09-29 DIAGNOSIS — Z1389 Encounter for screening for other disorder: Secondary | ICD-10-CM | POA: Diagnosis not present

## 2018-09-29 DIAGNOSIS — Z6827 Body mass index (BMI) 27.0-27.9, adult: Secondary | ICD-10-CM | POA: Diagnosis not present

## 2018-09-30 ENCOUNTER — Other Ambulatory Visit (HOSPITAL_COMMUNITY): Payer: Self-pay | Admitting: Internal Medicine

## 2018-09-30 DIAGNOSIS — R945 Abnormal results of liver function studies: Secondary | ICD-10-CM

## 2018-09-30 DIAGNOSIS — R220 Localized swelling, mass and lump, head: Secondary | ICD-10-CM

## 2018-09-30 DIAGNOSIS — R7989 Other specified abnormal findings of blood chemistry: Secondary | ICD-10-CM

## 2018-10-07 ENCOUNTER — Ambulatory Visit (HOSPITAL_COMMUNITY): Payer: Medicare Other

## 2018-10-08 ENCOUNTER — Ambulatory Visit (HOSPITAL_COMMUNITY): Payer: Medicare Other

## 2018-10-20 ENCOUNTER — Ambulatory Visit (HOSPITAL_COMMUNITY): Payer: Medicare Other

## 2018-10-20 ENCOUNTER — Encounter (HOSPITAL_COMMUNITY): Payer: Self-pay

## 2018-10-22 ENCOUNTER — Encounter (HOSPITAL_COMMUNITY): Payer: Self-pay

## 2018-10-22 ENCOUNTER — Ambulatory Visit (HOSPITAL_COMMUNITY): Payer: Medicare Other

## 2018-11-04 ENCOUNTER — Ambulatory Visit (HOSPITAL_COMMUNITY): Payer: Medicare Other

## 2018-11-04 ENCOUNTER — Other Ambulatory Visit (HOSPITAL_COMMUNITY): Payer: Medicare Other

## 2018-11-10 ENCOUNTER — Ambulatory Visit (HOSPITAL_COMMUNITY): Payer: Medicare Other | Attending: Internal Medicine

## 2018-11-10 ENCOUNTER — Ambulatory Visit (HOSPITAL_COMMUNITY): Admission: RE | Admit: 2018-11-10 | Payer: Medicare Other | Source: Ambulatory Visit

## 2018-11-17 DIAGNOSIS — G43701 Chronic migraine without aura, not intractable, with status migrainosus: Secondary | ICD-10-CM | POA: Diagnosis not present

## 2018-11-17 DIAGNOSIS — D329 Benign neoplasm of meninges, unspecified: Secondary | ICD-10-CM | POA: Diagnosis not present

## 2018-11-17 DIAGNOSIS — K5903 Drug induced constipation: Secondary | ICD-10-CM | POA: Diagnosis not present

## 2018-11-17 DIAGNOSIS — M542 Cervicalgia: Secondary | ICD-10-CM | POA: Diagnosis not present

## 2018-11-22 ENCOUNTER — Ambulatory Visit (HOSPITAL_COMMUNITY): Payer: Medicare Other | Attending: Internal Medicine

## 2018-11-22 ENCOUNTER — Ambulatory Visit (HOSPITAL_COMMUNITY): Payer: Medicare Other

## 2018-12-09 ENCOUNTER — Ambulatory Visit (HOSPITAL_COMMUNITY)
Admission: RE | Admit: 2018-12-09 | Discharge: 2018-12-09 | Disposition: A | Discharge status: Home / Self Care | Payer: Medicare Other | Source: Ambulatory Visit | Attending: Internal Medicine | Admitting: Internal Medicine

## 2018-12-09 ENCOUNTER — Other Ambulatory Visit: Payer: Self-pay

## 2018-12-09 DIAGNOSIS — R945 Abnormal results of liver function studies: Secondary | ICD-10-CM | POA: Diagnosis not present

## 2018-12-09 DIAGNOSIS — R220 Localized swelling, mass and lump, head: Secondary | ICD-10-CM | POA: Diagnosis not present

## 2018-12-09 DIAGNOSIS — K760 Fatty (change of) liver, not elsewhere classified: Secondary | ICD-10-CM | POA: Diagnosis not present

## 2018-12-09 DIAGNOSIS — D320 Benign neoplasm of cerebral meninges: Secondary | ICD-10-CM | POA: Diagnosis not present

## 2018-12-09 DIAGNOSIS — R7989 Other specified abnormal findings of blood chemistry: Secondary | ICD-10-CM

## 2018-12-09 DIAGNOSIS — I679 Cerebrovascular disease, unspecified: Secondary | ICD-10-CM | POA: Diagnosis not present

## 2018-12-16 DIAGNOSIS — D510 Vitamin B12 deficiency anemia due to intrinsic factor deficiency: Secondary | ICD-10-CM | POA: Diagnosis not present

## 2018-12-16 DIAGNOSIS — I10 Essential (primary) hypertension: Secondary | ICD-10-CM | POA: Diagnosis not present

## 2018-12-16 DIAGNOSIS — E7849 Other hyperlipidemia: Secondary | ICD-10-CM | POA: Diagnosis not present

## 2018-12-16 DIAGNOSIS — Z23 Encounter for immunization: Secondary | ICD-10-CM | POA: Diagnosis not present

## 2018-12-16 DIAGNOSIS — F419 Anxiety disorder, unspecified: Secondary | ICD-10-CM | POA: Diagnosis not present

## 2018-12-16 DIAGNOSIS — Z6827 Body mass index (BMI) 27.0-27.9, adult: Secondary | ICD-10-CM | POA: Diagnosis not present

## 2019-03-16 DIAGNOSIS — Z6826 Body mass index (BMI) 26.0-26.9, adult: Secondary | ICD-10-CM | POA: Diagnosis not present

## 2019-03-16 DIAGNOSIS — E538 Deficiency of other specified B group vitamins: Secondary | ICD-10-CM | POA: Diagnosis not present

## 2019-03-16 DIAGNOSIS — D3610 Benign neoplasm of peripheral nerves and autonomic nervous system, unspecified: Secondary | ICD-10-CM | POA: Diagnosis not present

## 2019-03-16 DIAGNOSIS — E663 Overweight: Secondary | ICD-10-CM | POA: Diagnosis not present

## 2019-03-16 DIAGNOSIS — F419 Anxiety disorder, unspecified: Secondary | ICD-10-CM | POA: Diagnosis not present

## 2019-03-22 ENCOUNTER — Encounter: Payer: Self-pay | Admitting: Gastroenterology

## 2019-03-29 DIAGNOSIS — Z79891 Long term (current) use of opiate analgesic: Secondary | ICD-10-CM | POA: Diagnosis not present

## 2019-03-29 DIAGNOSIS — M545 Low back pain: Secondary | ICD-10-CM | POA: Diagnosis not present

## 2019-03-29 DIAGNOSIS — G43701 Chronic migraine without aura, not intractable, with status migrainosus: Secondary | ICD-10-CM | POA: Diagnosis not present

## 2019-03-29 DIAGNOSIS — D329 Benign neoplasm of meninges, unspecified: Secondary | ICD-10-CM | POA: Diagnosis not present

## 2019-03-29 DIAGNOSIS — K5903 Drug induced constipation: Secondary | ICD-10-CM | POA: Diagnosis not present

## 2019-03-29 DIAGNOSIS — M542 Cervicalgia: Secondary | ICD-10-CM | POA: Diagnosis not present

## 2019-04-08 DIAGNOSIS — J329 Chronic sinusitis, unspecified: Secondary | ICD-10-CM | POA: Diagnosis not present

## 2019-04-08 DIAGNOSIS — Z681 Body mass index (BMI) 19 or less, adult: Secondary | ICD-10-CM | POA: Diagnosis not present

## 2019-04-08 DIAGNOSIS — I10 Essential (primary) hypertension: Secondary | ICD-10-CM | POA: Diagnosis not present

## 2019-04-08 DIAGNOSIS — G894 Chronic pain syndrome: Secondary | ICD-10-CM | POA: Diagnosis not present

## 2019-04-08 DIAGNOSIS — B349 Viral infection, unspecified: Secondary | ICD-10-CM | POA: Diagnosis not present

## 2019-04-10 DIAGNOSIS — G894 Chronic pain syndrome: Secondary | ICD-10-CM | POA: Diagnosis not present

## 2019-04-10 DIAGNOSIS — F4542 Pain disorder with related psychological factors: Secondary | ICD-10-CM | POA: Diagnosis not present

## 2019-04-10 DIAGNOSIS — I10 Essential (primary) hypertension: Secondary | ICD-10-CM | POA: Diagnosis not present

## 2019-04-10 DIAGNOSIS — M5136 Other intervertebral disc degeneration, lumbar region: Secondary | ICD-10-CM | POA: Diagnosis not present

## 2019-04-11 ENCOUNTER — Ambulatory Visit: Payer: Medicaid Other | Attending: Internal Medicine

## 2019-04-13 DIAGNOSIS — R6889 Other general symptoms and signs: Secondary | ICD-10-CM | POA: Diagnosis not present

## 2019-04-13 DIAGNOSIS — E663 Overweight: Secondary | ICD-10-CM | POA: Diagnosis not present

## 2019-04-13 DIAGNOSIS — Z6825 Body mass index (BMI) 25.0-25.9, adult: Secondary | ICD-10-CM | POA: Diagnosis not present

## 2019-04-13 DIAGNOSIS — J019 Acute sinusitis, unspecified: Secondary | ICD-10-CM | POA: Diagnosis not present

## 2019-04-27 ENCOUNTER — Encounter: Payer: Self-pay | Admitting: Nurse Practitioner

## 2019-04-28 ENCOUNTER — Ambulatory Visit: Payer: Medicare HMO | Admitting: Nurse Practitioner

## 2019-05-08 DIAGNOSIS — F4542 Pain disorder with related psychological factors: Secondary | ICD-10-CM | POA: Diagnosis not present

## 2019-05-08 DIAGNOSIS — I10 Essential (primary) hypertension: Secondary | ICD-10-CM | POA: Diagnosis not present

## 2019-05-08 DIAGNOSIS — G894 Chronic pain syndrome: Secondary | ICD-10-CM | POA: Diagnosis not present

## 2019-05-08 DIAGNOSIS — M5136 Other intervertebral disc degeneration, lumbar region: Secondary | ICD-10-CM | POA: Diagnosis not present

## 2019-05-12 ENCOUNTER — Telehealth: Payer: Self-pay | Admitting: Nurse Practitioner

## 2019-05-12 ENCOUNTER — Encounter: Payer: Self-pay | Admitting: General Practice

## 2019-05-12 ENCOUNTER — Ambulatory Visit: Payer: Medicare HMO | Admitting: Nurse Practitioner

## 2019-05-12 ENCOUNTER — Other Ambulatory Visit: Payer: Self-pay

## 2019-05-12 NOTE — Telephone Encounter (Signed)
Discharge from practice.

## 2019-05-12 NOTE — Progress Notes (Deleted)
Primary Care Physician:  Redmond School, MD Primary Gastroenterologist:  Dr.   Rayne Du chief complaint on file.   HPI:   Julia [name] is a 59 y.o. female who presents on referral from primary care to schedule colonoscopy.  Nurse/phone triage was deferred office visit due to medications likely necessitating monitored anesthesia care.  Reviewed information provided with referral including ***.  No history of colonoscopy found in our system.  Today she states   Past Medical History:  Diagnosis Date  . Anxiety   . Brain tumor (benign) ([place])   . Chronic pain   . Elevated cholesterol   . Headache(784.0)   . Hypertension   . Numerous moles   . Trichimoniasis     Past Surgical History:  Procedure Laterality Date  . EXCISION OF SKIN TAG N/A 09/22/2017   Procedure: EXCISION OF SKIN TAGS ON FACE AND NECK (Procedure #2);  Surgeon: Jonnie Kind, MD;  Location: [facility];  Service: Gynecology;  Laterality: N/A;  . HYSTEROSCOPY WITH D & C N/A 09/22/2017   Procedure: DILATATION AND CURETTAGE /HYSTEROSCOPY; EXCISION OF VULVAR AND RIGHT AND LEFT THIGH SKIN TAGS (Procedure #1);  Surgeon: Jonnie Kind, MD;  Location: [facility];  Service: Gynecology;  Laterality: N/A;  . POLYPECTOMY N/A 09/22/2017   Procedure: REMOVAL OF ENDOMETRIAL POLYP (Procedure #1);  Surgeon: Jonnie Kind, MD;  Location: [facility];  Service: Gynecology;  Laterality: N/A;  . TUBAL LIGATION      Current Outpatient Medications  Medication Sig Dispense Refill  . acetaminophen (TYLENOL) 325 MG tablet Take 650 mg by mouth every 6 (six) hours as needed for moderate pain or headache.    . albuterol (PROVENTIL HFA;VENTOLIN HFA) 108 (90 Base) MCG/ACT inhaler     . ALPRAZolam (XANAX) 1 MG tablet Take 1 mg by mouth 3 (three) times daily as needed for anxiety.     Marland Kitchen amitriptyline (ELAVIL) 25 MG tablet Take 25 mg by mouth at bedtime.     Marland Kitchen amLODipine (NORVASC) 10 MG tablet Take 10 mg by mouth daily.     Marland Kitchen ezetimibe (ZETIA) 10 MG  tablet Take 10 mg by mouth daily.     Marland Kitchen HYDROcodone-acetaminophen (NORCO/VICODIN) 5-325 MG tablet One tablet every six hours for pain.  Limit 7 days. 28 tablet 0  . ibuprofen (ADVIL,MOTRIN) 200 MG tablet Take 200-400 mg by mouth every 6 (six) hours as needed for headache or moderate pain.    . Linaclotide (LINZESS) 145 MCG CAPS 1 PO 30 mins prior to your first meal (Patient not taking: Reported on 08/24/2017) 30 capsule 11  . losartan (COZAAR) 50 MG tablet Take 50 mg by mouth daily.     Marland Kitchen PARoxetine (PAXIL) 30 MG tablet Take 30 mg by mouth at bedtime.     . potassium chloride (K-DUR) 10 MEQ tablet Take 1 tablet (10 mEq total) by mouth 2 (two) times daily. (Patient not taking: Reported on 09/14/2017) 10 tablet 0  . pravastatin (PRAVACHOL) 40 MG tablet Take 40 mg by mouth daily.    . valACYclovir (VALTREX) 1000 MG tablet TAKE (1) TABLET BY MOUTH ONCE DAILY. (Patient not taking: Reported on 08/24/2017) 30 tablet 1   No current facility-administered medications for this visit.    Allergies as of 05/12/2019 - Review Complete 01/06/2018  Allergen Reaction Noted  . Naprosyn [naproxen] Nausea Only 06/02/2012  . Penicillins Nausea And Vomiting and Other (See Comments) 10/16/2014  . Promethazine hcl Nausea Only 11/19/2010    Family History  Problem Relation Age of Onset  . Stroke Mother   . Heart failure Mother   . Cancer Father        lung  . Diabetes Brother     Social History   Socioeconomic History  . Marital status: Widowed    Spouse name: Not on file  . Number of children: Not on file  . Years of education: Not on file  . Highest education level: Not on file  Occupational History  . Not on file  Tobacco Use  . Smoking status: Never Smoker  . Smokeless tobacco: Never Used  Substance and Sexual Activity  . Alcohol use: No  . Drug use: No  . Sexual activity: Not Currently    Birth control/protection: Post-menopausal, Surgical    Comment: tubal  Other Topics Concern  . Not on  file  Social History Narrative  . Not on file   Social Determinants of Health   Financial Resource Strain:   . Difficulty of Paying Living Expenses: Not on file  Food Insecurity:   . Worried About Charity fundraiser in the Last Year: Not on file  . Ran Out of Food in the Last Year: Not on file  Transportation Needs:   . Lack of Transportation (Medical): Not on file  . Lack of Transportation (Non-Medical): Not on file  Physical Activity:   . Days of Exercise per Week: Not on file  . Minutes of Exercise per Session: Not on file  Stress:   . Feeling of Stress : Not on file  Social Connections:   . Frequency of Communication with Friends and Family: Not on file  . Frequency of Social Gatherings with Friends and Family: Not on file  . Attends Religious Services: Not on file  . Active Member of Clubs or Organizations: Not on file  . Attends Archivist Meetings: Not on file  . Marital Status: Not on file  Intimate Partner Violence:   . Fear of Current or Ex-Partner: Not on file  . Emotionally Abused: Not on file  . Physically Abused: Not on file  . Sexually Abused: Not on file    Review of Systems: General: Negative for anorexia, weight loss, fever, chills, fatigue, weakness. Eyes: Negative for vision changes.  ENT: Negative for hoarseness, difficulty swallowing , nasal congestion. CV: Negative for chest pain, angina, palpitations, dyspnea on exertion, peripheral edema.  Respiratory: Negative for dyspnea at rest, dyspnea on exertion, cough, sputum, wheezing.  GI: See history of present illness. GU:  Negative for dysuria, hematuria, urinary incontinence, urinary frequency, nocturnal urination.  MS: Negative for joint pain, low back pain.  Derm: Negative for rash or itching.  Neuro: Negative for weakness, abnormal sensation, seizure, frequent headaches, memory loss, confusion.  Psych: Negative for anxiety, depression, suicidal ideation, hallucinations.  Endo: Negative  for unusual weight change.  Heme: Negative for bruising or bleeding. Allergy: Negative for rash or hives.    Physical Exam: LMP 09/06/2012  General:   Alert and oriented. Pleasant and cooperative. Well-nourished and well-developed.  Head:  Normocephalic and atraumatic. Eyes:  Without icterus, sclera clear and conjunctiva pink.  Ears:  Normal auditory acuity. Mouth:  No deformity or lesions, oral mucosa pink.  Throat/Neck:  Supple, without mass or thyromegaly. Cardiovascular:  S1, S2 present without murmurs appreciated. Normal pulses noted. Extremities without clubbing or edema. Respiratory:  Clear to auscultation bilaterally. No wheezes, rales, or rhonchi. No distress.  Gastrointestinal:  +BS, soft, non-tender and non-distended. No HSM noted. No  guarding or rebound. No masses appreciated.  Rectal:  Deferred  Musculoskalatal:  Symmetrical without gross deformities. Normal posture. Skin:  Intact without significant lesions or rashes. Neurologic:  Alert and oriented x4;  grossly normal neurologically. Psych:  Alert and cooperative. Normal mood and affect. Heme/Lymph/Immune: No significant cervical adenopathy. No excessive bruising noted.    05/12/2019 1:25 PM   Disclaimer: This note was dictated with voice recognition software. Similar sounding words can inadvertently be transcribed and may not be corrected upon review.

## 2019-05-12 NOTE — Telephone Encounter (Signed)
Discharge letter mailed  

## 2019-05-12 NOTE — Telephone Encounter (Signed)
PATIENT NO SHOW X 3.  HAS RECEIVED 2 LETTERS WITH NO SHOW POLICY

## 2019-05-12 NOTE — Telephone Encounter (Signed)
Routing to Dr. Oneida Alar for recommendations on discharging the patient from the practice.

## 2019-05-13 NOTE — Telephone Encounter (Signed)
Noted  

## 2019-06-08 DIAGNOSIS — M5136 Other intervertebral disc degeneration, lumbar region: Secondary | ICD-10-CM | POA: Diagnosis not present

## 2019-06-08 DIAGNOSIS — G894 Chronic pain syndrome: Secondary | ICD-10-CM | POA: Diagnosis not present

## 2019-06-08 DIAGNOSIS — F4542 Pain disorder with related psychological factors: Secondary | ICD-10-CM | POA: Diagnosis not present

## 2019-06-08 DIAGNOSIS — E7849 Other hyperlipidemia: Secondary | ICD-10-CM | POA: Diagnosis not present

## 2019-06-14 DIAGNOSIS — M545 Low back pain: Secondary | ICD-10-CM | POA: Diagnosis not present

## 2019-06-14 DIAGNOSIS — R110 Nausea: Secondary | ICD-10-CM | POA: Diagnosis not present

## 2019-06-14 DIAGNOSIS — Z6825 Body mass index (BMI) 25.0-25.9, adult: Secondary | ICD-10-CM | POA: Diagnosis not present

## 2019-06-14 DIAGNOSIS — R8290 Unspecified abnormal findings in urine: Secondary | ICD-10-CM | POA: Diagnosis not present

## 2019-06-14 DIAGNOSIS — I959 Hypotension, unspecified: Secondary | ICD-10-CM | POA: Diagnosis not present

## 2019-06-14 DIAGNOSIS — E538 Deficiency of other specified B group vitamins: Secondary | ICD-10-CM | POA: Diagnosis not present

## 2019-06-16 ENCOUNTER — Other Ambulatory Visit (HOSPITAL_COMMUNITY): Payer: Self-pay | Admitting: Internal Medicine

## 2019-06-16 DIAGNOSIS — Z1231 Encounter for screening mammogram for malignant neoplasm of breast: Secondary | ICD-10-CM

## 2019-06-22 ENCOUNTER — Ambulatory Visit (HOSPITAL_COMMUNITY): Payer: Medicare HMO

## 2019-06-28 DIAGNOSIS — Q8500 Neurofibromatosis, unspecified: Secondary | ICD-10-CM | POA: Diagnosis not present

## 2019-06-28 DIAGNOSIS — G4489 Other headache syndrome: Secondary | ICD-10-CM | POA: Diagnosis not present

## 2019-06-28 DIAGNOSIS — G43009 Migraine without aura, not intractable, without status migrainosus: Secondary | ICD-10-CM | POA: Diagnosis not present

## 2019-06-28 DIAGNOSIS — D329 Benign neoplasm of meninges, unspecified: Secondary | ICD-10-CM | POA: Diagnosis not present

## 2019-06-28 DIAGNOSIS — I10 Essential (primary) hypertension: Secondary | ICD-10-CM | POA: Diagnosis not present

## 2019-06-28 DIAGNOSIS — K5903 Drug induced constipation: Secondary | ICD-10-CM | POA: Diagnosis not present

## 2019-06-28 DIAGNOSIS — M545 Low back pain: Secondary | ICD-10-CM | POA: Diagnosis not present

## 2019-06-28 DIAGNOSIS — Z79891 Long term (current) use of opiate analgesic: Secondary | ICD-10-CM | POA: Diagnosis not present

## 2019-06-28 DIAGNOSIS — M542 Cervicalgia: Secondary | ICD-10-CM | POA: Diagnosis not present

## 2019-07-08 DIAGNOSIS — F4542 Pain disorder with related psychological factors: Secondary | ICD-10-CM | POA: Diagnosis not present

## 2019-07-08 DIAGNOSIS — E7849 Other hyperlipidemia: Secondary | ICD-10-CM | POA: Diagnosis not present

## 2019-07-08 DIAGNOSIS — M5136 Other intervertebral disc degeneration, lumbar region: Secondary | ICD-10-CM | POA: Diagnosis not present

## 2019-07-08 DIAGNOSIS — G894 Chronic pain syndrome: Secondary | ICD-10-CM | POA: Diagnosis not present

## 2019-07-19 ENCOUNTER — Ambulatory Visit: Payer: Medicare HMO | Admitting: Orthopaedic Surgery

## 2019-07-21 ENCOUNTER — Ambulatory Visit: Payer: Medicare HMO | Admitting: Orthopaedic Surgery

## 2019-07-22 ENCOUNTER — Telehealth: Payer: Self-pay | Admitting: Adult Health

## 2019-07-22 NOTE — Telephone Encounter (Signed)

## 2019-07-25 ENCOUNTER — Other Ambulatory Visit: Payer: Medicare HMO | Admitting: Adult Health

## 2019-08-08 DIAGNOSIS — F4542 Pain disorder with related psychological factors: Secondary | ICD-10-CM | POA: Diagnosis not present

## 2019-08-08 DIAGNOSIS — M5136 Other intervertebral disc degeneration, lumbar region: Secondary | ICD-10-CM | POA: Diagnosis not present

## 2019-08-08 DIAGNOSIS — G894 Chronic pain syndrome: Secondary | ICD-10-CM | POA: Diagnosis not present

## 2019-08-08 DIAGNOSIS — E7849 Other hyperlipidemia: Secondary | ICD-10-CM | POA: Diagnosis not present

## 2019-08-26 DIAGNOSIS — G8929 Other chronic pain: Secondary | ICD-10-CM | POA: Diagnosis not present

## 2019-08-26 DIAGNOSIS — E559 Vitamin D deficiency, unspecified: Secondary | ICD-10-CM | POA: Diagnosis not present

## 2019-08-26 DIAGNOSIS — I10 Essential (primary) hypertension: Secondary | ICD-10-CM | POA: Diagnosis not present

## 2019-08-26 DIAGNOSIS — Z1389 Encounter for screening for other disorder: Secondary | ICD-10-CM | POA: Diagnosis not present

## 2019-08-26 DIAGNOSIS — G43009 Migraine without aura, not intractable, without status migrainosus: Secondary | ICD-10-CM | POA: Diagnosis not present

## 2019-08-26 DIAGNOSIS — E663 Overweight: Secondary | ICD-10-CM | POA: Diagnosis not present

## 2019-08-26 DIAGNOSIS — Z6826 Body mass index (BMI) 26.0-26.9, adult: Secondary | ICD-10-CM | POA: Diagnosis not present

## 2019-08-26 DIAGNOSIS — Z0001 Encounter for general adult medical examination with abnormal findings: Secondary | ICD-10-CM | POA: Diagnosis not present

## 2019-09-07 DIAGNOSIS — E7849 Other hyperlipidemia: Secondary | ICD-10-CM | POA: Diagnosis not present

## 2019-09-07 DIAGNOSIS — G894 Chronic pain syndrome: Secondary | ICD-10-CM | POA: Diagnosis not present

## 2019-09-07 DIAGNOSIS — F4542 Pain disorder with related psychological factors: Secondary | ICD-10-CM | POA: Diagnosis not present

## 2019-09-07 DIAGNOSIS — M5136 Other intervertebral disc degeneration, lumbar region: Secondary | ICD-10-CM | POA: Diagnosis not present

## 2019-09-23 ENCOUNTER — Ambulatory Visit (HOSPITAL_COMMUNITY): Payer: Medicare HMO

## 2019-09-26 ENCOUNTER — Other Ambulatory Visit (HOSPITAL_COMMUNITY): Payer: Self-pay | Admitting: Neurology

## 2019-09-26 DIAGNOSIS — M542 Cervicalgia: Secondary | ICD-10-CM | POA: Diagnosis not present

## 2019-09-26 DIAGNOSIS — I10 Essential (primary) hypertension: Secondary | ICD-10-CM | POA: Diagnosis not present

## 2019-09-26 DIAGNOSIS — Q8500 Neurofibromatosis, unspecified: Secondary | ICD-10-CM | POA: Diagnosis not present

## 2019-09-26 DIAGNOSIS — G4489 Other headache syndrome: Secondary | ICD-10-CM | POA: Diagnosis not present

## 2019-09-26 DIAGNOSIS — M545 Low back pain, unspecified: Secondary | ICD-10-CM

## 2019-09-26 DIAGNOSIS — Z79891 Long term (current) use of opiate analgesic: Secondary | ICD-10-CM | POA: Diagnosis not present

## 2019-09-26 DIAGNOSIS — K5903 Drug induced constipation: Secondary | ICD-10-CM | POA: Diagnosis not present

## 2019-09-26 DIAGNOSIS — G43009 Migraine without aura, not intractable, without status migrainosus: Secondary | ICD-10-CM | POA: Diagnosis not present

## 2019-09-26 DIAGNOSIS — D329 Benign neoplasm of meninges, unspecified: Secondary | ICD-10-CM | POA: Diagnosis not present

## 2019-10-11 ENCOUNTER — Ambulatory Visit: Payer: Medicare HMO | Admitting: Orthopaedic Surgery

## 2019-10-11 ENCOUNTER — Encounter: Payer: Self-pay | Admitting: Orthopaedic Surgery

## 2019-10-24 ENCOUNTER — Other Ambulatory Visit: Payer: Medicare HMO | Admitting: Adult Health

## 2019-10-26 ENCOUNTER — Ambulatory Visit
Admission: EM | Admit: 2019-10-26 | Discharge: 2019-10-26 | Disposition: A | Discharge status: Home / Self Care | Payer: Medicare HMO | Attending: Emergency Medicine | Admitting: Emergency Medicine

## 2019-10-26 ENCOUNTER — Other Ambulatory Visit: Payer: Self-pay

## 2019-10-26 DIAGNOSIS — E663 Overweight: Secondary | ICD-10-CM | POA: Diagnosis not present

## 2019-10-26 DIAGNOSIS — J069 Acute upper respiratory infection, unspecified: Secondary | ICD-10-CM

## 2019-10-26 DIAGNOSIS — K5900 Constipation, unspecified: Secondary | ICD-10-CM | POA: Diagnosis not present

## 2019-10-26 DIAGNOSIS — R1032 Left lower quadrant pain: Secondary | ICD-10-CM | POA: Diagnosis not present

## 2019-10-26 DIAGNOSIS — R1031 Right lower quadrant pain: Secondary | ICD-10-CM | POA: Diagnosis not present

## 2019-10-26 DIAGNOSIS — Z1152 Encounter for screening for COVID-19: Secondary | ICD-10-CM

## 2019-10-26 DIAGNOSIS — Z6826 Body mass index (BMI) 26.0-26.9, adult: Secondary | ICD-10-CM | POA: Diagnosis not present

## 2019-10-26 MED ORDER — FLUTICASONE PROPIONATE 50 MCG/ACT NA SUSP
1.0000 | Freq: Every day | NASAL | 0 refills | Status: DC
Start: 2019-10-26 — End: 2020-11-18

## 2019-10-26 MED ORDER — BENZONATATE 100 MG PO CAPS
100.0000 mg | ORAL_CAPSULE | Freq: Three times a day (TID) | ORAL | 0 refills | Status: DC
Start: 2019-10-26 — End: 2020-07-29

## 2019-10-26 MED ORDER — PREDNISONE 10 MG PO TABS
20.0000 mg | ORAL_TABLET | Freq: Every day | ORAL | 0 refills | Status: AC
Start: 2019-10-26 — End: 2019-11-02

## 2019-10-26 NOTE — ED Triage Notes (Signed)
Pt presents with c/o cough and body aches that began on Sunday

## 2019-10-26 NOTE — ED Provider Notes (Signed)
[facility]   [id number] 10/26/19 Arrival Time: 1937   CC: COVID symptoms  SUBJECTIVE: History from: patient.  Julia [name] is a 59 y.o. female who presents to the urgent care with a complaint of cough, body ache for the past 3 days.  Denies sick exposure to COVID, flu or strep.  Denies recent travel.  Has tried OTC medication without relief.  Denies alleviating or aggravating factors.  Denies previous symptoms in the past.   Denies fever, chills, fatigue, sinus pain, rhinorrhea, sore throat, SOB, wheezing, chest pain, nausea, changes in bowel or bladder habits.     ROS: As per HPI.  All other pertinent ROS negative.     Past Medical History:  Diagnosis Date  . Anxiety   . Brain tumor (benign) ([place])   . Chronic pain   . Elevated cholesterol   . Headache(784.0)   . Hypertension   . Numerous moles   . Trichimoniasis    Past Surgical History:  Procedure Laterality Date  . EXCISION OF SKIN TAG N/A 09/22/2017   Procedure: EXCISION OF SKIN TAGS ON FACE AND NECK (Procedure #2);  Surgeon: Jonnie Kind, MD;  Location: [facility];  Service: Gynecology;  Laterality: N/A;  . HYSTEROSCOPY WITH D & C N/A 09/22/2017   Procedure: DILATATION AND CURETTAGE /HYSTEROSCOPY; EXCISION OF VULVAR AND RIGHT AND LEFT THIGH SKIN TAGS (Procedure #1);  Surgeon: Jonnie Kind, MD;  Location: [facility];  Service: Gynecology;  Laterality: N/A;  . POLYPECTOMY N/A 09/22/2017   Procedure: REMOVAL OF ENDOMETRIAL POLYP (Procedure #1);  Surgeon: Jonnie Kind, MD;  Location: [facility];  Service: Gynecology;  Laterality: N/A;  . TUBAL LIGATION     Allergies  Allergen Reactions  . Naprosyn [Naproxen] Nausea Only  . Penicillins Nausea And Vomiting and Other (See Comments)    Has patient had a PCN reaction causing immediate rash, facial/tongue/throat swelling, SOB or lightheadedness with hypotension: No Has patient had a PCN reaction causing severe rash involving mucus membranes or skin necrosis: No Has  patient had a PCN reaction that required hospitalization Yes Has patient had a PCN reaction occurring within the last 10 years: No If all of the above answers are "NO", then may proceed with Cephalosporin use.   . Promethazine Hcl Nausea Only   No current facility-administered medications on file prior to encounter.   Current Outpatient Medications on File Prior to Encounter  Medication Sig Dispense Refill  . acetaminophen (TYLENOL) 325 MG tablet Take 650 mg by mouth every 6 (six) hours as needed for moderate pain or headache.    . albuterol (PROVENTIL HFA;VENTOLIN HFA) 108 (90 Base) MCG/ACT inhaler     . ALPRAZolam (XANAX) 1 MG tablet Take 1 mg by mouth 3 (three) times daily as needed for anxiety.     Marland Kitchen amitriptyline (ELAVIL) 25 MG tablet Take 25 mg by mouth at bedtime.     Marland Kitchen amLODipine (NORVASC) 10 MG tablet Take 10 mg by mouth daily.     Marland Kitchen ezetimibe (ZETIA) 10 MG tablet Take 10 mg by mouth daily.     Marland Kitchen HYDROcodone-acetaminophen (NORCO/VICODIN) 5-325 MG tablet One tablet every six hours for pain.  Limit 7 days. 28 tablet 0  . ibuprofen (ADVIL,MOTRIN) 200 MG tablet Take 200-400 mg by mouth every 6 (six) hours as needed for headache or moderate pain.    . Linaclotide (LINZESS) 145 MCG CAPS 1 PO 30 mins prior to your first meal (Patient not taking: Reported on 08/24/2017) 30 capsule  11  . losartan (COZAAR) 50 MG tablet Take 50 mg by mouth daily.     Marland Kitchen PARoxetine (PAXIL) 30 MG tablet Take 30 mg by mouth at bedtime.     . potassium chloride (K-DUR) 10 MEQ tablet Take 1 tablet (10 mEq total) by mouth 2 (two) times daily. (Patient not taking: Reported on 09/14/2017) 10 tablet 0  . pravastatin (PRAVACHOL) 40 MG tablet Take 40 mg by mouth daily.    . valACYclovir (VALTREX) 1000 MG tablet TAKE (1) TABLET BY MOUTH ONCE DAILY. (Patient not taking: Reported on 08/24/2017) 30 tablet 1   Social History   Socioeconomic History  . Marital status: Widowed    Spouse name: Not on file  . Number of children:  Not on file  . Years of education: Not on file  . Highest education level: Not on file  Occupational History  . Not on file  Tobacco Use  . Smoking status: Never Smoker  . Smokeless tobacco: Never Used  Vaping Use  . Vaping Use: Never used  Substance and Sexual Activity  . Alcohol use: No  . Drug use: No  . Sexual activity: Not Currently    Birth control/protection: Post-menopausal, Surgical    Comment: tubal  Other Topics Concern  . Not on file  Social History Narrative  . Not on file   Social Determinants of Health   Financial Resource Strain:   . Difficulty of Paying Living Expenses:   Food Insecurity:   . Worried About Charity fundraiser in the Last Year:   . Arboriculturist in the Last Year:   Transportation Needs:   . Film/video editor (Medical):   Marland Kitchen Lack of Transportation (Non-Medical):   Physical Activity:   . Days of Exercise per Week:   . Minutes of Exercise per Session:   Stress:   . Feeling of Stress :   Social Connections:   . Frequency of Communication with Friends and Family:   . Frequency of Social Gatherings with Friends and Family:   . Attends Religious Services:   . Active Member of Clubs or Organizations:   . Attends Archivist Meetings:   Marland Kitchen Marital Status:   Intimate Partner Violence:   . Fear of Current or Ex-Partner:   . Emotionally Abused:   Marland Kitchen Physically Abused:   . Sexually Abused:    Family History  Problem Relation Age of Onset  . Stroke Mother   . Heart failure Mother   . Cancer Father        lung  . Diabetes Brother     OBJECTIVE:  Vitals:   10/26/19 0838  BP: 100/68  Pulse: 71  Resp: 18  Temp: 97.7 F (36.5 C)  SpO2: 99%     General appearance: alert; appears fatigued, but nontoxic; speaking in full sentences and tolerating own secretions HEENT: NCAT; Ears: EACs clear, TMs pearly gray; Eyes: PERRL.  EOM grossly intact. Sinuses: nontender; Nose: nares patent without rhinorrhea, Throat: oropharynx  clear, tonsils non erythematous or enlarged, uvula midline  Neck: supple without LAD Lungs: unlabored respirations, symmetrical air entry; cough: present; no respiratory distress; CTAB Heart: regular rate and rhythm.  Radial pulses 2+ symmetrical bilaterally Skin: warm and dry Psychological: alert and cooperative; normal mood and affect  LABS:  No results found for this or any previous visit (from the past 24 hour(s)).   ASSESSMENT & PLAN:  1. Viral URI with cough   2. Encounter for screening for COVID-19  Meds ordered this encounter  Medications  . benzonatate (TESSALON) 100 MG capsule    Sig: Take 1 capsule (100 mg total) by mouth every 8 (eight) hours.    Dispense:  30 capsule    Refill:  0  . fluticasone (FLONASE) 50 MCG/ACT nasal spray    Sig: Place 1 spray into both nostrils daily for 14 days.    Dispense:  16 g    Refill:  0  . predniSONE (DELTASONE) 10 MG tablet    Sig: Take 2 tablets (20 mg total) by mouth daily for 7 days.    Dispense:  14 tablet    Refill:  0    Discharge Instructions   COVID testing ordered.  It will take between 2-7 days for test results.  Someone will contact you regarding abnormal results.    In the meantime: You should remain isolated in your home for 10 days from symptom onset AND greater than 24 hours after symptoms resolution (absence of fever without the use of fever-reducing medication and improvement in respiratory symptoms), whichever is longer Get plenty of rest and push fluids Tessalon Perles prescribed for cough Low-dose prednisone prescribed Flonase for nasal congestion and runny nose Use medications daily for symptom relief Use OTC medications like ibuprofen or tylenol as needed fever or pain Call or go to the ED if you have any new or worsening symptoms such as fever, worsening cough, shortness of breath, chest tightness, chest pain, turning blue, changes in mental status, etc...   Reviewed expectations re: course of  current medical issues. Questions answered. Outlined signs and symptoms indicating need for more acute intervention. Patient verbalized understanding. After Visit Summary given.      Note: This document was prepared using Dragon voice recognition software and may include unintentional dictation errors.    Emerson Monte, [place] 10/26/19 [phone]

## 2019-10-26 NOTE — Discharge Instructions (Addendum)
COVID testing ordered.  It will take between 2-7 days for test results.  Someone will contact you regarding abnormal results.    In the meantime: You should remain isolated in your home for 10 days from symptom onset AND greater than 24 hours after symptoms resolution (absence of fever without the use of fever-reducing medication and improvement in respiratory symptoms), whichever is longer Get plenty of rest and push fluids Tessalon Perles prescribed for cough Flonase for nasal congestion and runny nose Low dose prednisone prescribed Use medications daily for symptom relief Use OTC medications like ibuprofen or tylenol as needed fever or pain Call or go to the ED if you have any new or worsening symptoms such as fever, worsening cough, shortness of breath, chest tightness, chest pain, turning blue, changes in mental status, etc..Marland Kitchen

## 2019-10-27 LAB — NOVEL CORONAVIRUS, NAA: SARS-CoV-2, NAA: NOT DETECTED

## 2019-10-27 LAB — SARS-COV-2, NAA 2 DAY TAT

## 2019-11-21 ENCOUNTER — Ambulatory Visit (HOSPITAL_COMMUNITY): Payer: Medicare HMO

## 2019-12-08 DIAGNOSIS — M5136 Other intervertebral disc degeneration, lumbar region: Secondary | ICD-10-CM | POA: Diagnosis not present

## 2019-12-08 DIAGNOSIS — F4542 Pain disorder with related psychological factors: Secondary | ICD-10-CM | POA: Diagnosis not present

## 2019-12-08 DIAGNOSIS — E7849 Other hyperlipidemia: Secondary | ICD-10-CM | POA: Diagnosis not present

## 2019-12-08 DIAGNOSIS — G894 Chronic pain syndrome: Secondary | ICD-10-CM | POA: Diagnosis not present

## 2019-12-14 ENCOUNTER — Other Ambulatory Visit: Payer: Medicare HMO | Admitting: Adult Health

## 2019-12-16 DIAGNOSIS — F419 Anxiety disorder, unspecified: Secondary | ICD-10-CM | POA: Diagnosis not present

## 2019-12-16 DIAGNOSIS — I10 Essential (primary) hypertension: Secondary | ICD-10-CM | POA: Diagnosis not present

## 2019-12-16 DIAGNOSIS — J329 Chronic sinusitis, unspecified: Secondary | ICD-10-CM | POA: Diagnosis not present

## 2019-12-16 DIAGNOSIS — Z681 Body mass index (BMI) 19 or less, adult: Secondary | ICD-10-CM | POA: Diagnosis not present

## 2019-12-21 DIAGNOSIS — G4489 Other headache syndrome: Secondary | ICD-10-CM | POA: Diagnosis not present

## 2019-12-21 DIAGNOSIS — Q8500 Neurofibromatosis, unspecified: Secondary | ICD-10-CM | POA: Diagnosis not present

## 2019-12-21 DIAGNOSIS — K5903 Drug induced constipation: Secondary | ICD-10-CM | POA: Diagnosis not present

## 2019-12-21 DIAGNOSIS — D329 Benign neoplasm of meninges, unspecified: Secondary | ICD-10-CM | POA: Diagnosis not present

## 2019-12-21 DIAGNOSIS — M545 Low back pain, unspecified: Secondary | ICD-10-CM | POA: Diagnosis not present

## 2019-12-21 DIAGNOSIS — I10 Essential (primary) hypertension: Secondary | ICD-10-CM | POA: Diagnosis not present

## 2019-12-21 DIAGNOSIS — M542 Cervicalgia: Secondary | ICD-10-CM | POA: Diagnosis not present

## 2019-12-21 DIAGNOSIS — G43009 Migraine without aura, not intractable, without status migrainosus: Secondary | ICD-10-CM | POA: Diagnosis not present

## 2019-12-21 DIAGNOSIS — Z79891 Long term (current) use of opiate analgesic: Secondary | ICD-10-CM | POA: Diagnosis not present

## 2020-01-07 DIAGNOSIS — E7849 Other hyperlipidemia: Secondary | ICD-10-CM | POA: Diagnosis not present

## 2020-01-07 DIAGNOSIS — G894 Chronic pain syndrome: Secondary | ICD-10-CM | POA: Diagnosis not present

## 2020-01-07 DIAGNOSIS — M5136 Other intervertebral disc degeneration, lumbar region: Secondary | ICD-10-CM | POA: Diagnosis not present

## 2020-01-07 DIAGNOSIS — F4542 Pain disorder with related psychological factors: Secondary | ICD-10-CM | POA: Diagnosis not present

## 2020-01-09 ENCOUNTER — Ambulatory Visit (HOSPITAL_COMMUNITY): Payer: Medicare HMO

## 2020-01-19 ENCOUNTER — Other Ambulatory Visit: Payer: Self-pay

## 2020-01-19 ENCOUNTER — Ambulatory Visit (HOSPITAL_COMMUNITY)
Admission: RE | Admit: 2020-01-19 | Discharge: 2020-01-19 | Disposition: A | Discharge status: Home / Self Care | Payer: Medicare HMO | Source: Ambulatory Visit | Attending: Internal Medicine | Admitting: Internal Medicine

## 2020-01-19 DIAGNOSIS — Z1231 Encounter for screening mammogram for malignant neoplasm of breast: Secondary | ICD-10-CM | POA: Diagnosis not present

## 2020-01-21 DIAGNOSIS — M5137 Other intervertebral disc degeneration, lumbosacral region: Secondary | ICD-10-CM | POA: Diagnosis not present

## 2020-01-26 DIAGNOSIS — Z79891 Long term (current) use of opiate analgesic: Secondary | ICD-10-CM | POA: Diagnosis not present

## 2020-01-26 DIAGNOSIS — M545 Low back pain, unspecified: Secondary | ICD-10-CM | POA: Diagnosis not present

## 2020-01-26 DIAGNOSIS — K5903 Drug induced constipation: Secondary | ICD-10-CM | POA: Diagnosis not present

## 2020-01-26 DIAGNOSIS — M542 Cervicalgia: Secondary | ICD-10-CM | POA: Diagnosis not present

## 2020-01-26 DIAGNOSIS — D329 Benign neoplasm of meninges, unspecified: Secondary | ICD-10-CM | POA: Diagnosis not present

## 2020-01-26 DIAGNOSIS — G43009 Migraine without aura, not intractable, without status migrainosus: Secondary | ICD-10-CM | POA: Diagnosis not present

## 2020-01-26 DIAGNOSIS — Q8500 Neurofibromatosis, unspecified: Secondary | ICD-10-CM | POA: Diagnosis not present

## 2020-01-26 DIAGNOSIS — I10 Essential (primary) hypertension: Secondary | ICD-10-CM | POA: Diagnosis not present

## 2020-01-26 DIAGNOSIS — G4489 Other headache syndrome: Secondary | ICD-10-CM | POA: Diagnosis not present

## 2020-01-30 ENCOUNTER — Other Ambulatory Visit: Payer: Medicare HMO | Admitting: Adult Health

## 2020-02-21 DIAGNOSIS — I10 Essential (primary) hypertension: Secondary | ICD-10-CM | POA: Diagnosis not present

## 2020-02-21 DIAGNOSIS — F419 Anxiety disorder, unspecified: Secondary | ICD-10-CM | POA: Diagnosis not present

## 2020-02-21 DIAGNOSIS — Z6828 Body mass index (BMI) 28.0-28.9, adult: Secondary | ICD-10-CM | POA: Diagnosis not present

## 2020-02-21 DIAGNOSIS — I959 Hypotension, unspecified: Secondary | ICD-10-CM | POA: Diagnosis not present

## 2020-02-21 DIAGNOSIS — J309 Allergic rhinitis, unspecified: Secondary | ICD-10-CM | POA: Diagnosis not present

## 2020-02-21 DIAGNOSIS — M5416 Radiculopathy, lumbar region: Secondary | ICD-10-CM | POA: Diagnosis not present

## 2020-03-08 DIAGNOSIS — Z6827 Body mass index (BMI) 27.0-27.9, adult: Secondary | ICD-10-CM | POA: Diagnosis not present

## 2020-03-08 DIAGNOSIS — I10 Essential (primary) hypertension: Secondary | ICD-10-CM | POA: Diagnosis not present

## 2020-03-08 DIAGNOSIS — J329 Chronic sinusitis, unspecified: Secondary | ICD-10-CM | POA: Diagnosis not present

## 2020-03-08 DIAGNOSIS — R55 Syncope and collapse: Secondary | ICD-10-CM | POA: Diagnosis not present

## 2020-03-09 DIAGNOSIS — F4542 Pain disorder with related psychological factors: Secondary | ICD-10-CM | POA: Diagnosis not present

## 2020-03-09 DIAGNOSIS — G894 Chronic pain syndrome: Secondary | ICD-10-CM | POA: Diagnosis not present

## 2020-03-09 DIAGNOSIS — E7849 Other hyperlipidemia: Secondary | ICD-10-CM | POA: Diagnosis not present

## 2020-03-09 DIAGNOSIS — M5136 Other intervertebral disc degeneration, lumbar region: Secondary | ICD-10-CM | POA: Diagnosis not present

## 2020-03-13 ENCOUNTER — Ambulatory Visit: Payer: Medicare HMO | Admitting: Orthopaedic Surgery

## 2020-03-16 ENCOUNTER — Encounter: Payer: Self-pay | Admitting: Emergency Medicine

## 2020-03-16 ENCOUNTER — Ambulatory Visit
Admission: EM | Admit: 2020-03-16 | Discharge: 2020-03-16 | Disposition: A | Discharge status: Home / Self Care | Payer: Medicare HMO | Attending: Family Medicine | Admitting: Family Medicine

## 2020-03-16 ENCOUNTER — Other Ambulatory Visit: Payer: Self-pay

## 2020-03-16 DIAGNOSIS — R112 Nausea with vomiting, unspecified: Secondary | ICD-10-CM

## 2020-03-16 DIAGNOSIS — K529 Noninfective gastroenteritis and colitis, unspecified: Secondary | ICD-10-CM

## 2020-03-16 DIAGNOSIS — R197 Diarrhea, unspecified: Secondary | ICD-10-CM

## 2020-03-16 DIAGNOSIS — R52 Pain, unspecified: Secondary | ICD-10-CM | POA: Diagnosis not present

## 2020-03-16 DIAGNOSIS — R5383 Other fatigue: Secondary | ICD-10-CM

## 2020-03-16 DIAGNOSIS — Z1152 Encounter for screening for COVID-19: Secondary | ICD-10-CM

## 2020-03-16 DIAGNOSIS — R1084 Generalized abdominal pain: Secondary | ICD-10-CM

## 2020-03-16 MED ORDER — ONDANSETRON HCL 4 MG PO TABS: 4.0000 mg | ORAL_TABLET | Freq: Four times a day (QID) | ORAL | 0 refills | Status: DC

## 2020-03-16 MED ORDER — ONDANSETRON 4 MG PO TBDP
4.0000 mg | ORAL_TABLET | Freq: Once | ORAL | Status: AC
  Administered 2020-03-16: 4 mg via ORAL

## 2020-03-16 NOTE — ED Triage Notes (Signed)
N/V/D, headache, body aches that started this morning.  Pt has vomited x 3 and had 3 loose stools since 0630 today.

## 2020-03-16 NOTE — ED Provider Notes (Signed)
[place]   [id number] 03/16/20 Arrival Time: 4098   CC: COVID symptoms  SUBJECTIVE: History from: patient.  Julia [name] is a 60 y.o. female who presents with nausea, vomiting, diarrhea, headache, body aches that started this morning. Denies sick exposure to COVID, flu or strep. Denies recent travel. Has negative history of Covid. Has not completed Covid vaccines. Has not taken OTC medications for this. There are no aggravating or alleviating factors. Denies previous symptoms in the past. Denies fever, chills, fatigue, sinus pain, rhinorrhea, sore throat, SOB, wheezing, chest pain, changes in bladder habits.    ROS: As per HPI.  All other pertinent ROS negative.     Past Medical History:  Diagnosis Date  . Anxiety   . Brain tumor (benign) ([place])   . Chronic pain   . Elevated cholesterol   . Headache(784.0)   . Hypertension   . Numerous moles   . Trichimoniasis    Past Surgical History:  Procedure Laterality Date  . EXCISION OF SKIN TAG N/A 09/22/2017   Procedure: EXCISION OF SKIN TAGS ON FACE AND NECK (Procedure #2);  Surgeon: Jonnie Kind, MD;  Location: [facility];  Service: Gynecology;  Laterality: N/A;  . HYSTEROSCOPY WITH D & C N/A 09/22/2017   Procedure: DILATATION AND CURETTAGE /HYSTEROSCOPY; EXCISION OF VULVAR AND RIGHT AND LEFT THIGH SKIN TAGS (Procedure #1);  Surgeon: Jonnie Kind, MD;  Location: [facility];  Service: Gynecology;  Laterality: N/A;  . POLYPECTOMY N/A 09/22/2017   Procedure: REMOVAL OF ENDOMETRIAL POLYP (Procedure #1);  Surgeon: Jonnie Kind, MD;  Location: [facility];  Service: Gynecology;  Laterality: N/A;  . TUBAL LIGATION     Allergies  Allergen Reactions  . Naprosyn [Naproxen] Nausea Only  . Penicillins Nausea And Vomiting and Other (See Comments)    Has patient had a PCN reaction causing immediate rash, facial/tongue/throat swelling, SOB or lightheadedness with hypotension: No Has patient had a PCN reaction causing severe rash  involving mucus membranes or skin necrosis: No Has patient had a PCN reaction that required hospitalization Yes Has patient had a PCN reaction occurring within the last 10 years: No If all of the above answers are "NO", then may proceed with Cephalosporin use.   . Promethazine Hcl Nausea Only   No current facility-administered medications on file prior to encounter.   Current Outpatient Medications on File Prior to Encounter  Medication Sig Dispense Refill  . acetaminophen (TYLENOL) 325 MG tablet Take 650 mg by mouth every 6 (six) hours as needed for moderate pain or headache.    . albuterol (PROVENTIL HFA;VENTOLIN HFA) 108 (90 Base) MCG/ACT inhaler     . ALPRAZolam (XANAX) 1 MG tablet Take 1 mg by mouth 3 (three) times daily as needed for anxiety.     Marland Kitchen amitriptyline (ELAVIL) 25 MG tablet Take 25 mg by mouth at bedtime.     Marland Kitchen amLODipine (NORVASC) 10 MG tablet Take 10 mg by mouth daily.     . benzonatate (TESSALON) 100 MG capsule Take 1 capsule (100 mg total) by mouth every 8 (eight) hours. 30 capsule 0  . ezetimibe (ZETIA) 10 MG tablet Take 10 mg by mouth daily.     . fluticasone (FLONASE) 50 MCG/ACT nasal spray Place 1 spray into both nostrils daily for 14 days. 16 g 0  . HYDROcodone-acetaminophen (NORCO/VICODIN) 5-325 MG tablet One tablet every six hours for pain.  Limit 7 days. 28 tablet 0  . ibuprofen (ADVIL,MOTRIN) 200 MG tablet Take 200-400  mg by mouth every 6 (six) hours as needed for headache or moderate pain.    . Linaclotide (LINZESS) 145 MCG CAPS 1 PO 30 mins prior to your first meal (Patient not taking: Reported on 08/24/2017) 30 capsule 11  . losartan (COZAAR) 50 MG tablet Take 50 mg by mouth daily.     Marland Kitchen PARoxetine (PAXIL) 30 MG tablet Take 30 mg by mouth at bedtime.     . potassium chloride (K-DUR) 10 MEQ tablet Take 1 tablet (10 mEq total) by mouth 2 (two) times daily. (Patient not taking: Reported on 09/14/2017) 10 tablet 0  . pravastatin (PRAVACHOL) 40 MG tablet Take 40 mg by  mouth daily.    . valACYclovir (VALTREX) 1000 MG tablet TAKE (1) TABLET BY MOUTH ONCE DAILY. (Patient not taking: Reported on 08/24/2017) 30 tablet 1   Social History   Socioeconomic History  . Marital status: Widowed    Spouse name: Not on file  . Number of children: Not on file  . Years of education: Not on file  . Highest education level: Not on file  Occupational History  . Not on file  Tobacco Use  . Smoking status: Never Smoker  . Smokeless tobacco: Never Used  Vaping Use  . Vaping Use: Never used  Substance and Sexual Activity  . Alcohol use: No  . Drug use: No  . Sexual activity: Not Currently    Birth control/protection: Post-menopausal, Surgical    Comment: tubal  Other Topics Concern  . Not on file  Social History Narrative  . Not on file   Social Determinants of Health   Financial Resource Strain: Not on file  Food Insecurity: Not on file  Transportation Needs: Not on file  Physical Activity: Not on file  Stress: Not on file  Social Connections: Not on file  Intimate Partner Violence: Not on file   Family History  Problem Relation Age of Onset  . Stroke Mother   . Heart failure Mother   . Cancer Father        lung  . Diabetes Brother     OBJECTIVE:  Vitals:   03/16/20 1328  BP: 126/89  Pulse: 99  Resp: 18  Temp: 98.8 F (37.1 C)  TempSrc: Oral  SpO2: 97%  Weight: 140 lb (63.5 kg)  Height: 5' (1.524 m)     General appearance: alert; appears fatigued, but nontoxic; speaking in full sentences and tolerating own secretions HEENT: NCAT; Ears: EACs clear, TMs pearly gray; Eyes: PERRL.  EOM grossly intact. Sinuses: nontender; Nose: nares patent with clear rhinorrhea, Throat: oropharynx erythematous, cobblestoning present, tonsils non erythematous or enlarged, uvula midline  Neck: supple without LAD Lungs: unlabored respirations, symmetrical air entry; cough: absent; no respiratory distress; CTAB Heart: regular rate and rhythm.  Radial pulses 2+  symmetrical bilaterally Skin: warm and dry Psychological: alert and cooperative; normal mood and affect  LABS:  No results found for this or any previous visit (from the past 24 hour(s)).   ASSESSMENT & PLAN:  1. Generalized body aches   2. Noninfectious gastroenteritis, unspecified type   3. Other fatigue   4. Generalized abdominal pain   5. Encounter for screening for COVID-19   6. Nausea and vomiting, intractability of vomiting not specified, unspecified vomiting type   7. Diarrhea, unspecified type     Meds ordered this encounter  Medications  . ondansetron (ZOFRAN-ODT) disintegrating tablet 4 mg  . ondansetron (ZOFRAN) 4 MG tablet    Sig: Take 1 tablet (4  mg total) by mouth every 6 (six) hours.    Dispense:  12 tablet    Refill:  0    Order Specific Question:   Supervising Provider    Answer:   Chase Picket [[id number]]   Zofran given in office today Prescribed zofran May use imodium for diarrhea  Continue supportive care at home COVID and flu testing ordered.  It will take between 2-3 days for test results. Someone will contact you regarding abnormal results.   Patient should remain in quarantine until they have received Covid results.  If negative you may resume normal activities (go back to work/school) while practicing hand hygiene, social distance, and mask wearing.  If positive, patient should remain in quarantine for at least 5 days from symptom onset AND greater than 72 hours after symptoms resolution (absence of fever without the use of fever-reducing medication and improvement in respiratory symptoms), whichever is longer Get plenty of rest and push fluids Use OTC zyrtec for nasal congestion, runny nose, and/or sore throat Use OTC flonase for nasal congestion and runny nose Use medications daily for symptom relief Use OTC medications like ibuprofen or tylenol as needed fever or pain Call or go to the ED if you have any new or worsening symptoms such as fever,  worsening cough, shortness of breath, chest tightness, chest pain, turning blue, changes in mental status.  Reviewed expectations re: course of current medical issues. Questions answered. Outlined signs and symptoms indicating need for more acute intervention. Patient verbalized understanding. After Visit Summary given.         Faustino Congress, NP 03/16/20 1346

## 2020-03-16 NOTE — Discharge Instructions (Addendum)
We have given you zofran in the office today for nausea  I have sent in Zofran for you to take one tablet every 8 hours as needed for nausea.  May use imodium for diarrhea  Rehydrate with oral electrolyte solutions  Follow up with this office or with primary care if symptoms are persisting.  Follow up in the ER for high fever, trouble swallowing, trouble breathing, other concerning symptoms.

## 2020-03-21 LAB — COVID-19, FLU A+B NAA
Influenza A, NAA: NOT DETECTED
Influenza B, NAA: NOT DETECTED
SARS-CoV-2, NAA: NOT DETECTED

## 2020-04-07 DIAGNOSIS — E7849 Other hyperlipidemia: Secondary | ICD-10-CM | POA: Diagnosis not present

## 2020-04-07 DIAGNOSIS — G894 Chronic pain syndrome: Secondary | ICD-10-CM | POA: Diagnosis not present

## 2020-04-07 DIAGNOSIS — M5136 Other intervertebral disc degeneration, lumbar region: Secondary | ICD-10-CM | POA: Diagnosis not present

## 2020-04-07 DIAGNOSIS — F4542 Pain disorder with related psychological factors: Secondary | ICD-10-CM | POA: Diagnosis not present

## 2020-04-12 ENCOUNTER — Other Ambulatory Visit: Payer: Medicare HMO | Admitting: Adult Health

## 2020-04-19 DIAGNOSIS — K5903 Drug induced constipation: Secondary | ICD-10-CM | POA: Diagnosis not present

## 2020-04-19 DIAGNOSIS — I10 Essential (primary) hypertension: Secondary | ICD-10-CM | POA: Diagnosis not present

## 2020-04-19 DIAGNOSIS — D329 Benign neoplasm of meninges, unspecified: Secondary | ICD-10-CM | POA: Diagnosis not present

## 2020-04-19 DIAGNOSIS — M542 Cervicalgia: Secondary | ICD-10-CM | POA: Diagnosis not present

## 2020-04-19 DIAGNOSIS — M545 Low back pain, unspecified: Secondary | ICD-10-CM | POA: Diagnosis not present

## 2020-04-19 DIAGNOSIS — G4489 Other headache syndrome: Secondary | ICD-10-CM | POA: Diagnosis not present

## 2020-04-19 DIAGNOSIS — G43009 Migraine without aura, not intractable, without status migrainosus: Secondary | ICD-10-CM | POA: Diagnosis not present

## 2020-04-19 DIAGNOSIS — Z79891 Long term (current) use of opiate analgesic: Secondary | ICD-10-CM | POA: Diagnosis not present

## 2020-04-19 DIAGNOSIS — Q8500 Neurofibromatosis, unspecified: Secondary | ICD-10-CM | POA: Diagnosis not present

## 2020-05-02 DIAGNOSIS — L821 Other seborrheic keratosis: Secondary | ICD-10-CM | POA: Diagnosis not present

## 2020-05-02 DIAGNOSIS — E538 Deficiency of other specified B group vitamins: Secondary | ICD-10-CM | POA: Diagnosis not present

## 2020-05-02 DIAGNOSIS — Z1331 Encounter for screening for depression: Secondary | ICD-10-CM | POA: Diagnosis not present

## 2020-05-02 DIAGNOSIS — I10 Essential (primary) hypertension: Secondary | ICD-10-CM | POA: Diagnosis not present

## 2020-05-02 DIAGNOSIS — Q8509 Other neurofibromatosis: Secondary | ICD-10-CM | POA: Diagnosis not present

## 2020-05-02 DIAGNOSIS — Z6827 Body mass index (BMI) 27.0-27.9, adult: Secondary | ICD-10-CM | POA: Diagnosis not present

## 2020-05-07 DIAGNOSIS — G894 Chronic pain syndrome: Secondary | ICD-10-CM | POA: Diagnosis not present

## 2020-05-07 DIAGNOSIS — E7849 Other hyperlipidemia: Secondary | ICD-10-CM | POA: Diagnosis not present

## 2020-05-07 DIAGNOSIS — M5136 Other intervertebral disc degeneration, lumbar region: Secondary | ICD-10-CM | POA: Diagnosis not present

## 2020-05-07 DIAGNOSIS — F4542 Pain disorder with related psychological factors: Secondary | ICD-10-CM | POA: Diagnosis not present

## 2020-05-20 ENCOUNTER — Emergency Department (HOSPITAL_COMMUNITY)
Admission: EM | Admit: 2020-05-20 | Discharge: 2020-05-20 | Disposition: A | Discharge status: Home / Self Care | Payer: Medicare HMO | Attending: Emergency Medicine | Admitting: Emergency Medicine

## 2020-05-20 ENCOUNTER — Other Ambulatory Visit: Payer: Self-pay

## 2020-05-20 ENCOUNTER — Encounter (HOSPITAL_COMMUNITY): Payer: Self-pay

## 2020-05-20 DIAGNOSIS — J039 Acute tonsillitis, unspecified: Secondary | ICD-10-CM | POA: Insufficient documentation

## 2020-05-20 DIAGNOSIS — I10 Essential (primary) hypertension: Secondary | ICD-10-CM | POA: Insufficient documentation

## 2020-05-20 DIAGNOSIS — J029 Acute pharyngitis, unspecified: Secondary | ICD-10-CM | POA: Diagnosis present

## 2020-05-20 MED ORDER — AMLODIPINE BESYLATE 5 MG PO TABS
10.0000 mg | ORAL_TABLET | Freq: Once | ORAL | Status: AC
  Administered 2020-05-20: 10 mg via ORAL
  Filled 2020-05-20: qty 2

## 2020-05-20 MED ORDER — MAGIC MOUTHWASH W/LIDOCAINE
10.0000 mL | Freq: Four times a day (QID) | ORAL | 0 refills | Status: DC | PRN
Start: 2020-05-20 — End: 2020-07-29

## 2020-05-20 MED ORDER — LOSARTAN POTASSIUM 25 MG PO TABS
50.0000 mg | ORAL_TABLET | Freq: Once | ORAL | Status: AC
  Administered 2020-05-20: 50 mg via ORAL
  Filled 2020-05-20: qty 2

## 2020-05-20 MED ORDER — AZITHROMYCIN 250 MG PO TABS
ORAL_TABLET | ORAL | 0 refills | Status: DC
Start: 2020-05-20 — End: 2020-07-29

## 2020-05-20 MED ORDER — AZITHROMYCIN 250 MG PO TABS
500.0000 mg | ORAL_TABLET | Freq: Once | ORAL | Status: AC
  Administered 2020-05-20: 500 mg via ORAL
  Filled 2020-05-20: qty 2

## 2020-05-20 NOTE — Discharge Instructions (Signed)
Take your next dose of Zithromax tomorrow evening as you have received today's dose here.  Warm salt water gargles can help relieve throat pain.  As discussed, your ears look completely normal, I suspect your ear pain is actually pain that is referred from your painful tonsils.  Plan a recheck with your primary MD if your symptoms do not improve with this antibiotic treatment.  Make sure you are not missing your doses of your blood pressure medications.  You have also been prescribed a gargle to help with throat pain relief.  I also recommend Tylenol if needed for pain relief as well.

## 2020-05-20 NOTE — ED Notes (Addendum)

## 2020-05-20 NOTE — ED Triage Notes (Signed)
Pt arrives via POV c/o Right Ear Pain that began last night. Pt reports it is aching but denies any difficulty with hearing. Pt presents in Triage with a cotton bal in hear ear with Castoroil soaked in it and reports taking Advil for discomfort PTA.

## 2020-05-20 NOTE — ED Provider Notes (Signed)
[facility] EMERGENCY DEPARTMENT Provider Note   CSN: [id number] Arrival date & time: 05/20/20  1944     History Chief Complaint  Patient presents with  . Otalgia    Julia [name] is a 60 y.o. female presenting with right ear pain that started gradually last night.  She states she actually developed a sore throat prior to onset of ear pain, but now the pain is predominantly in the right ear and is worsened when she swallows.  She reports painful swallowing but is able to eat and drink.  She denies fevers or chills, nasal or sinus complaints.  She describes an achy constant pain, there is been no drainage from the ear itself and she denies any difficulty with her hearing.  She has taken Advil which has provided some pain relief.  She has also applied a cotton ball soaked with castor oil in the right ear with equivocal symptom relief.  She does notice that her blood pressure is elevated today.  She has not taken her nighttime blood pressure medications.   HPI     Past Medical History:  Diagnosis Date  . Anxiety   . Brain tumor (benign) ([place])   . Chronic pain   . Elevated cholesterol   . Headache(784.0)   . Hypertension   . Numerous moles   . Trichimoniasis     Patient Active Problem List   Diagnosis Date Noted  . Thickened endometrium 06/17/2017  . Screening for colorectal cancer 06/09/2017  . Encounter for gynecological examination with Papanicolaou smear of cervix 06/09/2017  . PMB (postmenopausal bleeding) 06/09/2017  . Constipation - functional 09/30/2012  . Colon cancer screening 09/30/2012  . Trichomoniasis of vagina 08/31/2012  . Pelvic relaxation due to cystocele, midline 06/02/2012  . Rectocele 06/02/2012    Past Surgical History:  Procedure Laterality Date  . EXCISION OF SKIN TAG N/A 09/22/2017   Procedure: EXCISION OF SKIN TAGS ON FACE AND NECK (Procedure #2);  Surgeon: Jonnie Kind, MD;  Location: [facility];  Service: Gynecology;  Laterality: N/A;  .  HYSTEROSCOPY WITH D & C N/A 09/22/2017   Procedure: DILATATION AND CURETTAGE /HYSTEROSCOPY; EXCISION OF VULVAR AND RIGHT AND LEFT THIGH SKIN TAGS (Procedure #1);  Surgeon: Jonnie Kind, MD;  Location: [facility];  Service: Gynecology;  Laterality: N/A;  . POLYPECTOMY N/A 09/22/2017   Procedure: REMOVAL OF ENDOMETRIAL POLYP (Procedure #1);  Surgeon: Jonnie Kind, MD;  Location: [facility];  Service: Gynecology;  Laterality: N/A;  . TUBAL LIGATION       OB History    Gravida  3   Para  3   Term      Preterm      AB      Living  3     SAB      IAB      Ectopic      Multiple      Live Births              Family History  Problem Relation Age of Onset  . Stroke Mother   . Heart failure Mother   . Cancer Father        lung  . Diabetes Brother     Social History   Tobacco Use  . Smoking status: Never Smoker  . Smokeless tobacco: Never Used  Vaping Use  . Vaping Use: Never used  Substance Use Topics  . Alcohol use: No  . Drug use: No    Home  Medications Prior to Admission medications   Medication Sig Start Date End Date Taking? Authorizing Provider  azithromycin (ZITHROMAX) 250 MG tablet Take 1 tablet daily for 4 days 05/20/20  Yes Idol, Almyra Free, PA-C  magic mouthwash w/lidocaine SOLN Take 10 mLs by mouth 4 (four) times daily as needed (throat pain). Note to pharmacy - equal parts diphendydramine, aluminum hydroxide and lidocaine HCL 05/20/20  Yes Idol, Almyra Free, PA-C  acetaminophen (TYLENOL) 325 MG tablet Take 650 mg by mouth every 6 (six) hours as needed for moderate pain or headache.    [provider]  albuterol (PROVENTIL HFA;VENTOLIN HFA) 108 (90 Base) MCG/ACT inhaler  09/18/17   [provider]  ALPRAZolam Duanne Moron) 1 MG tablet Take 1 mg by mouth 3 (three) times daily as needed for anxiety.     [provider]  amitriptyline (ELAVIL) 25 MG tablet Take 25 mg by mouth at bedtime.  05/22/12   [provider]  amLODipine (NORVASC) 10  MG tablet Take 10 mg by mouth daily.  07/10/17   [provider]  benzonatate (TESSALON) 100 MG capsule Take 1 capsule (100 mg total) by mouth every 8 (eight) hours. 10/26/19   Avegno, Darrelyn Hillock, FNP  ezetimibe (ZETIA) 10 MG tablet Take 10 mg by mouth daily.  08/15/16   [provider]  fluticasone (FLONASE) 50 MCG/ACT nasal spray Place 1 spray into both nostrils daily for 14 days. 10/26/19 11/09/19  Avegno, Darrelyn Hillock, FNP  HYDROcodone-acetaminophen (NORCO/VICODIN) 5-325 MG tablet One tablet every six hours for pain.  Limit 7 days. 01/06/18   Sanjuana Kava, MD  ibuprofen (ADVIL,MOTRIN) 200 MG tablet Take 200-400 mg by mouth every 6 (six) hours as needed for headache or moderate pain.    [provider]  Linaclotide (LINZESS) 145 MCG CAPS 1 PO 30 mins prior to your first meal Patient not taking: Reported on 08/24/2017 09/30/12   Danie Binder, MD  losartan (COZAAR) 50 MG tablet Take 50 mg by mouth daily.  09/01/13   [provider]  ondansetron (ZOFRAN) 4 MG tablet Take 1 tablet (4 mg total) by mouth every 6 (six) hours. 03/16/20   Faustino Congress, NP  PARoxetine (PAXIL) 30 MG tablet Take 30 mg by mouth at bedtime.  05/22/12   [provider]  potassium chloride (K-DUR) 10 MEQ tablet Take 1 tablet (10 mEq total) by mouth 2 (two) times daily. Patient not taking: Reported on 09/14/2017 03/10/16   Ward, Delice Bison, DO  pravastatin (PRAVACHOL) 40 MG tablet Take 40 mg by mouth daily. 05/20/12   [provider]  valACYclovir (VALTREX) 1000 MG tablet TAKE (1) TABLET BY MOUTH ONCE DAILY. Patient not taking: Reported on 08/24/2017 08/13/17   Jonnie Kind, MD    Allergies    Naprosyn [naproxen], Penicillins, and Promethazine hcl  Review of Systems   Review of Systems  Constitutional: Negative for chills and fever.  HENT: Positive for ear pain and sore throat. Negative for congestion, rhinorrhea, sinus pressure, trouble swallowing and voice change.   Eyes:  Negative for discharge.  Respiratory: Negative for cough, shortness of breath, wheezing and stridor.   Cardiovascular: Negative for chest pain.  Gastrointestinal: Negative for abdominal pain.  Genitourinary: Negative.   All other systems reviewed and are negative.   Physical Exam Updated Vital Signs BP (!) 175/99 (BP Location: Left Arm)   Pulse 83   Temp 98.4 F (36.9 C) (Oral)   Resp 16   Ht 5' (1.524 m)   Wt 64  kg   LMP 09/06/2012   SpO2 97%   BMI 27.56 kg/m   Physical Exam Constitutional:      Appearance: She is well-developed.  HENT:     Head: Normocephalic and atraumatic.     Right Ear: Tympanic membrane and ear canal normal. There is no impacted cerumen. Tympanic membrane is not erythematous, retracted or bulging.     Left Ear: Tympanic membrane and ear canal normal. There is no impacted cerumen. Tympanic membrane is not erythematous, retracted or bulging.     Nose: Mucosal edema present. No rhinorrhea.     Mouth/Throat:     Pharynx: Uvula midline. Pharyngeal swelling and posterior oropharyngeal erythema present. No oropharyngeal exudate.     Tonsils: No tonsillar exudate or tonsillar abscesses. 2+ on the right. 2+ on the left.  Eyes:     Conjunctiva/sclera: Conjunctivae normal.  Cardiovascular:     Rate and Rhythm: Normal rate.     Heart sounds: Normal heart sounds.  Pulmonary:     Effort: Pulmonary effort is normal. No respiratory distress.     Breath sounds: No wheezing or rales.  Abdominal:     Palpations: Abdomen is soft.     Tenderness: There is no abdominal tenderness.  Musculoskeletal:        General: Normal range of motion.  Skin:    General: Skin is warm and dry.     Findings: No rash.  Neurological:     Mental Status: She is alert and oriented to person, place, and time.     ED Results / Procedures / Treatments   Labs (all labs ordered are listed, but only abnormal results are displayed) Labs Reviewed - No data to  display  EKG None  Radiology No results found.  Procedures Procedures   Medications Ordered in ED Medications  azithromycin (ZITHROMAX) tablet 500 mg (has no administration in time range)  amLODipine (NORVASC) tablet 10 mg (has no administration in time range)  losartan (COZAAR) tablet 50 mg (has no administration in time range)    ED Course  I have reviewed the triage vital signs and the nursing notes.  Pertinent labs & imaging results that were available during my care of the patient were reviewed by me and considered in my medical decision making (see chart for details).    MDM Rules/Calculators/A&P                          Patient with acute tonsillitis per exam.  Her ear exam is normal, I suspect her pain is referred from her acute tonsillitis.  She was started on Zithromax given penicillin allergy.  Also encouraged Tylenol first pain relief.  Additionally was prescribed Magic mouthwash For topical pain management.  Her blood pressure is elevated this evening, she has not taken her nighttime pressure medications and these were ordered for her. Final Clinical Impression(s) / ED Diagnoses Final diagnoses:  Acute tonsillitis, unspecified etiology  Primary hypertension    Rx / DC Orders ED Discharge Orders         Ordered    azithromycin (ZITHROMAX) 250 MG tablet        05/20/20 2102    magic mouthwash w/lidocaine SOLN  4 times daily PRN        05/20/20 2102           Landis Martins 05/20/20 2108    Truddie Hidden, MD 05/20/20 2223

## 2020-05-29 DIAGNOSIS — Z0000 Encounter for general adult medical examination without abnormal findings: Secondary | ICD-10-CM | POA: Diagnosis not present

## 2020-05-29 DIAGNOSIS — Z1389 Encounter for screening for other disorder: Secondary | ICD-10-CM | POA: Diagnosis not present

## 2020-05-29 DIAGNOSIS — E538 Deficiency of other specified B group vitamins: Secondary | ICD-10-CM | POA: Diagnosis not present

## 2020-05-29 DIAGNOSIS — G894 Chronic pain syndrome: Secondary | ICD-10-CM | POA: Diagnosis not present

## 2020-05-29 DIAGNOSIS — J329 Chronic sinusitis, unspecified: Secondary | ICD-10-CM | POA: Diagnosis not present

## 2020-05-29 DIAGNOSIS — E663 Overweight: Secondary | ICD-10-CM | POA: Diagnosis not present

## 2020-05-29 DIAGNOSIS — E7849 Other hyperlipidemia: Secondary | ICD-10-CM | POA: Diagnosis not present

## 2020-05-29 DIAGNOSIS — I10 Essential (primary) hypertension: Secondary | ICD-10-CM | POA: Diagnosis not present

## 2020-05-29 DIAGNOSIS — Z6827 Body mass index (BMI) 27.0-27.9, adult: Secondary | ICD-10-CM | POA: Diagnosis not present

## 2020-06-06 DIAGNOSIS — G894 Chronic pain syndrome: Secondary | ICD-10-CM | POA: Diagnosis not present

## 2020-06-06 DIAGNOSIS — E7849 Other hyperlipidemia: Secondary | ICD-10-CM | POA: Diagnosis not present

## 2020-06-06 DIAGNOSIS — F4542 Pain disorder with related psychological factors: Secondary | ICD-10-CM | POA: Diagnosis not present

## 2020-07-07 DIAGNOSIS — E7849 Other hyperlipidemia: Secondary | ICD-10-CM | POA: Diagnosis not present

## 2020-07-07 DIAGNOSIS — G894 Chronic pain syndrome: Secondary | ICD-10-CM | POA: Diagnosis not present

## 2020-07-07 DIAGNOSIS — F4542 Pain disorder with related psychological factors: Secondary | ICD-10-CM | POA: Diagnosis not present

## 2020-07-26 ENCOUNTER — Other Ambulatory Visit (HOSPITAL_COMMUNITY): Payer: Self-pay | Admitting: Neurology

## 2020-07-26 DIAGNOSIS — G4489 Other headache syndrome: Secondary | ICD-10-CM | POA: Diagnosis not present

## 2020-07-26 DIAGNOSIS — G43009 Migraine without aura, not intractable, without status migrainosus: Secondary | ICD-10-CM | POA: Diagnosis not present

## 2020-07-26 DIAGNOSIS — K5903 Drug induced constipation: Secondary | ICD-10-CM | POA: Diagnosis not present

## 2020-07-26 DIAGNOSIS — Q8500 Neurofibromatosis, unspecified: Secondary | ICD-10-CM | POA: Diagnosis not present

## 2020-07-26 DIAGNOSIS — I10 Essential (primary) hypertension: Secondary | ICD-10-CM | POA: Diagnosis not present

## 2020-07-26 DIAGNOSIS — Z79891 Long term (current) use of opiate analgesic: Secondary | ICD-10-CM | POA: Diagnosis not present

## 2020-07-26 DIAGNOSIS — M542 Cervicalgia: Secondary | ICD-10-CM

## 2020-07-26 DIAGNOSIS — D329 Benign neoplasm of meninges, unspecified: Secondary | ICD-10-CM | POA: Diagnosis not present

## 2020-07-26 DIAGNOSIS — M545 Low back pain, unspecified: Secondary | ICD-10-CM | POA: Diagnosis not present

## 2020-07-27 DIAGNOSIS — E538 Deficiency of other specified B group vitamins: Secondary | ICD-10-CM | POA: Diagnosis not present

## 2020-07-27 DIAGNOSIS — Z6828 Body mass index (BMI) 28.0-28.9, adult: Secondary | ICD-10-CM | POA: Diagnosis not present

## 2020-07-27 DIAGNOSIS — I10 Essential (primary) hypertension: Secondary | ICD-10-CM | POA: Diagnosis not present

## 2020-07-27 DIAGNOSIS — D510 Vitamin B12 deficiency anemia due to intrinsic factor deficiency: Secondary | ICD-10-CM | POA: Diagnosis not present

## 2020-07-27 DIAGNOSIS — E663 Overweight: Secondary | ICD-10-CM | POA: Diagnosis not present

## 2020-07-27 DIAGNOSIS — F419 Anxiety disorder, unspecified: Secondary | ICD-10-CM | POA: Diagnosis not present

## 2020-07-29 ENCOUNTER — Emergency Department (HOSPITAL_COMMUNITY): Payer: Medicare HMO

## 2020-07-29 ENCOUNTER — Other Ambulatory Visit: Payer: Self-pay

## 2020-07-29 ENCOUNTER — Emergency Department (HOSPITAL_COMMUNITY)
Admission: EM | Admit: 2020-07-29 | Discharge: 2020-07-29 | Disposition: A | Discharge status: Home / Self Care | Payer: Medicare HMO | Attending: Emergency Medicine | Admitting: Emergency Medicine

## 2020-07-29 ENCOUNTER — Encounter (HOSPITAL_COMMUNITY): Payer: Self-pay | Admitting: Emergency Medicine

## 2020-07-29 DIAGNOSIS — R0789 Other chest pain: Secondary | ICD-10-CM | POA: Insufficient documentation

## 2020-07-29 DIAGNOSIS — R202 Paresthesia of skin: Secondary | ICD-10-CM | POA: Diagnosis not present

## 2020-07-29 DIAGNOSIS — F419 Anxiety disorder, unspecified: Secondary | ICD-10-CM | POA: Diagnosis not present

## 2020-07-29 DIAGNOSIS — R0602 Shortness of breath: Secondary | ICD-10-CM | POA: Diagnosis not present

## 2020-07-29 DIAGNOSIS — Z79899 Other long term (current) drug therapy: Secondary | ICD-10-CM | POA: Insufficient documentation

## 2020-07-29 DIAGNOSIS — I10 Essential (primary) hypertension: Secondary | ICD-10-CM | POA: Insufficient documentation

## 2020-07-29 LAB — CBC WITH DIFFERENTIAL/PLATELET
Abs Immature Granulocytes: 0.02 10*3/uL (ref 0.00–0.07)
Basophils Absolute: 0 10*3/uL (ref 0.0–0.1)
Basophils Relative: 1 %
Eosinophils Absolute: 0.1 10*3/uL (ref 0.0–0.5)
Eosinophils Relative: 2 %
HCT: 41.2 % (ref 36.0–46.0)
Hemoglobin: 13.8 g/dL (ref 12.0–15.0)
Immature Granulocytes: 0 %
Lymphocytes Relative: 37 %
Lymphs Abs: 2.3 10*3/uL (ref 0.7–4.0)
MCH: 29.9 pg (ref 26.0–34.0)
MCHC: 33.5 g/dL (ref 30.0–36.0)
MCV: 89.2 fL (ref 80.0–100.0)
Monocytes Absolute: 0.6 10*3/uL (ref 0.1–1.0)
Monocytes Relative: 9 %
Neutro Abs: 3.2 10*3/uL (ref 1.7–7.7)
Neutrophils Relative %: 51 %
Platelets: 200 10*3/uL (ref 150–400)
RBC: 4.62 MIL/uL (ref 3.87–5.11)
RDW: 13.3 % (ref 11.5–15.5)
WBC: 6.3 10*3/uL (ref 4.0–10.5)
nRBC: 0 % (ref 0.0–0.2)

## 2020-07-29 LAB — COMPREHENSIVE METABOLIC PANEL
ALT: 31 U/L (ref 0–44)
AST: 36 U/L (ref 15–41)
Albumin: 4.1 g/dL (ref 3.5–5.0)
Alkaline Phosphatase: 82 U/L (ref 38–126)
Anion gap: 9 (ref 5–15)
BUN: 13 mg/dL (ref 6–20)
CO2: 25 mmol/L (ref 22–32)
Calcium: 10.7 mg/dL — ABNORMAL HIGH (ref 8.9–10.3)
Chloride: 103 mmol/L (ref 98–111)
Creatinine, Ser: 0.96 mg/dL (ref 0.44–1.00)
GFR, Estimated: >60 mL/min (ref >60)
Glucose, Bld: 101 mg/dL — ABNORMAL HIGH (ref 70–99)
Potassium: 3.1 mmol/L — ABNORMAL LOW (ref 3.5–5.1)
Sodium: 137 mmol/L (ref 135–145)
Total Bilirubin: 2.7 mg/dL — ABNORMAL HIGH (ref 0.3–1.2)
Total Protein: 6.9 g/dL (ref 6.5–8.1)

## 2020-07-29 LAB — TROPONIN I (HIGH SENSITIVITY)
Troponin I (High Sensitivity): 3 ng/L (ref <18)
Troponin I (High Sensitivity): 3 ng/L (ref <18)

## 2020-07-29 MED ORDER — POTASSIUM CHLORIDE CRYS ER 20 MEQ PO TBCR
40.0000 meq | EXTENDED_RELEASE_TABLET | Freq: Once | ORAL | Status: AC
  Administered 2020-07-29: 40 meq via ORAL
  Filled 2020-07-29: qty 2

## 2020-07-29 NOTE — ED Provider Notes (Signed)
[facility] EMERGENCY DEPARTMENT Provider Note   CSN: [id number] Arrival date & time: 07/29/20  1501     History Chief Complaint  Patient presents with  . Shortness of Breath    Julia [name] is a 60 y.o. female.  Patient states that she has been having extra stress lately and she has been having some chest discomfort tingling in both her hands some shortness of breath.  This is happened to her before  The history is provided by the patient and medical records. No language interpreter was used.  Shortness of Breath Severity:  Mild Onset quality:  Sudden Timing:  Intermittent Progression:  Waxing and waning Chronicity:  Recurrent Context: not activity   Relieved by:  Nothing Worsened by:  Nothing Associated symptoms: chest pain   Associated symptoms: no abdominal pain, no cough, no headaches and no rash        Past Medical History:  Diagnosis Date  . Anxiety   . Brain tumor (benign) ([place])   . Chronic pain   . Elevated cholesterol   . Headache(784.0)   . Hypertension   . Numerous moles   . Trichimoniasis     Patient Active Problem List   Diagnosis Date Noted  . Thickened endometrium 06/17/2017  . Screening for colorectal cancer 06/09/2017  . Encounter for gynecological examination with Papanicolaou smear of cervix 06/09/2017  . PMB (postmenopausal bleeding) 06/09/2017  . Constipation - functional 09/30/2012  . Colon cancer screening 09/30/2012  . Trichomoniasis of vagina 08/31/2012  . Pelvic relaxation due to cystocele, midline 06/02/2012  . Rectocele 06/02/2012    Past Surgical History:  Procedure Laterality Date  . EXCISION OF SKIN TAG N/A 09/22/2017   Procedure: EXCISION OF SKIN TAGS ON FACE AND NECK (Procedure #2);  Surgeon: Jonnie Kind, MD;  Location: [facility];  Service: Gynecology;  Laterality: N/A;  . HYSTEROSCOPY WITH D & C N/A 09/22/2017   Procedure: DILATATION AND CURETTAGE /HYSTEROSCOPY; EXCISION OF VULVAR AND RIGHT AND LEFT THIGH SKIN TAGS  (Procedure #1);  Surgeon: Jonnie Kind, MD;  Location: [facility];  Service: Gynecology;  Laterality: N/A;  . POLYPECTOMY N/A 09/22/2017   Procedure: REMOVAL OF ENDOMETRIAL POLYP (Procedure #1);  Surgeon: Jonnie Kind, MD;  Location: [facility];  Service: Gynecology;  Laterality: N/A;  . TUBAL LIGATION       OB History    Gravida  3   Para  3   Term      Preterm      AB      Living  3     SAB      IAB      Ectopic      Multiple      Live Births              Family History  Problem Relation Age of Onset  . Stroke Mother   . Heart failure Mother   . Cancer Father        lung  . Diabetes Brother     Social History   Tobacco Use  . Smoking status: Never Smoker  . Smokeless tobacco: Never Used  Vaping Use  . Vaping Use: Never used  Substance Use Topics  . Alcohol use: No  . Drug use: No    Home Medications Prior to Admission medications   Medication Sig Start Date End Date Taking? Authorizing Provider  acetaminophen (TYLENOL) 325 MG tablet Take 650 mg by mouth every 6 (six) hours as needed  for moderate pain or headache.    [provider]  albuterol (PROVENTIL HFA;VENTOLIN HFA) 108 (90 Base) MCG/ACT inhaler  09/18/17   [provider]  ALPRAZolam Duanne Moron) 1 MG tablet Take 1 mg by mouth 3 (three) times daily as needed for anxiety.     [provider]  amLODipine (NORVASC) 10 MG tablet Take 10 mg by mouth daily.  07/10/17   [provider]  cyclobenzaprine (FLEXERIL) 10 MG tablet Take 10 mg by mouth 2 (two) times daily as needed for muscle spasms. 07/26/20   [provider]  ezetimibe (ZETIA) 10 MG tablet Take 10 mg by mouth daily.  08/15/16   [provider]  fluticasone (FLONASE) 50 MCG/ACT nasal spray Place 1 spray into both nostrils daily for 14 days. 10/26/19 11/09/19  Avegno, Darrelyn Hillock, FNP  gabapentin (NEURONTIN) 300 MG capsule Take 300 mg by mouth 2 (two) times daily. 07/26/20   [provider]   hydrochlorothiazide (MICROZIDE) 12.5 MG capsule Take 12.5 mg by mouth daily. 05/28/20   [provider]  ibuprofen (ADVIL,MOTRIN) 200 MG tablet Take 200-400 mg by mouth every 6 (six) hours as needed for headache or moderate pain.    [provider]  losartan (COZAAR) 50 MG tablet Take 50 mg by mouth daily.  09/01/13   [provider]  ondansetron (ZOFRAN) 4 MG tablet Take 1 tablet (4 mg total) by mouth every 6 (six) hours. 03/16/20   Faustino Congress, NP  oxyCODONE-acetaminophen (PERCOCET/ROXICET) 5-325 MG tablet Take 1 tablet by mouth 2 (two) times daily as needed for pain. 07/26/20   [provider]  PARoxetine (PAXIL) 30 MG tablet Take 30 mg by mouth at bedtime.  05/22/12   [provider]  pravastatin (PRAVACHOL) 40 MG tablet Take 40 mg by mouth daily. 05/20/12   [provider]  propranolol (INDERAL) 40 MG tablet Take 40 mg by mouth 2 (two) times daily. 07/26/20   [provider]  tiZANidine (ZANAFLEX) 4 MG tablet Take 4 mg by mouth 3 (three) times daily. 05/28/20   [provider]  topiramate (TOPAMAX) 100 MG tablet Take 100 mg by mouth at bedtime. 07/26/20   [provider]    Allergies    Naprosyn [naproxen], Penicillins, and Promethazine hcl  Review of Systems   Review of Systems  Constitutional: Negative for appetite change and fatigue.  HENT: Negative for congestion, ear discharge and sinus pressure.   Eyes: Negative for discharge.  Respiratory: Positive for shortness of breath. Negative for cough.   Cardiovascular: Positive for chest pain.  Gastrointestinal: Negative for abdominal pain and diarrhea.  Genitourinary: Negative for frequency and hematuria.  Musculoskeletal: Negative for back pain.  Skin: Negative for rash.  Neurological: Negative for seizures and headaches.  Psychiatric/Behavioral: Negative for hallucinations.    Physical Exam Updated Vital Signs BP 126/67   Pulse 72   Temp 97.7 F  (36.5 C) (Oral)   Resp 15   Ht 5' (1.524 m)   Wt 65.3 kg   LMP 09/06/2012   SpO2 99%   BMI 28.12 kg/m   Physical Exam Vitals and nursing note reviewed.  Constitutional:      Appearance: She is well-developed.  HENT:     Head: Normocephalic.     Mouth/Throat:     Mouth: Mucous membranes are moist.  Eyes:     General: No scleral icterus.    Conjunctiva/sclera: Conjunctivae normal.  Neck:     Thyroid: No thyromegaly.  Cardiovascular:  Rate and Rhythm: Normal rate and regular rhythm.     Heart sounds: No murmur heard. No friction rub. No gallop.   Pulmonary:     Breath sounds: No stridor. No wheezing or rales.  Chest:     Chest wall: No tenderness.  Abdominal:     General: There is no distension.     Tenderness: There is no abdominal tenderness. There is no rebound.  Musculoskeletal:        General: Normal range of motion.     Cervical back: Neck supple.  Lymphadenopathy:     Cervical: No cervical adenopathy.  Skin:    Findings: No erythema or rash.  Neurological:     Mental Status: She is oriented to person, place, and time.     Motor: No abnormal muscle tone.     Coordination: Coordination normal.  Psychiatric:        Behavior: Behavior normal.     ED Results / Procedures / Treatments   Labs (all labs ordered are listed, but only abnormal results are displayed) Labs Reviewed  COMPREHENSIVE METABOLIC PANEL - Abnormal; Notable for the following components:      Result Value   Potassium 3.1 (*)    Glucose, Bld 101 (*)    Calcium 10.7 (*)    Total Bilirubin 2.7 (*)    All other components within normal limits  CBC WITH DIFFERENTIAL/PLATELET  TROPONIN I (HIGH SENSITIVITY)  TROPONIN I (HIGH SENSITIVITY)    EKG EKG Interpretation  Date/Time:  Sunday Jul 29 2020 16:02:21 EDT Ventricular Rate:  83 PR Interval:  144 QRS Duration: 86 QT Interval:  394 QTC Calculation: 462 R Axis:   30 Text Interpretation: Normal sinus rhythm Low voltage QRS Borderline  ECG Confirmed by Milton Ferguson [phone]) on 07/29/2020 8:21:43 PM   Radiology DG Chest Port 1 View  Result Date: 07/29/2020 CLINICAL DATA:  Shortness of breath. EXAM: PORTABLE CHEST 1 VIEW COMPARISON:  January 11, 2015. FINDINGS: EKG leads project over the chest. Trachea midline. Cardiomediastinal contours and hilar structures are stable Lungs are clear. EKG leads project over the chest. On limited assessment no acute musculoskeletal process. IMPRESSION: No active disease. Electronically Signed   By: Zetta Bills M.D.   On: 07/29/2020 17:39    Procedures Procedures   Medications Ordered in ED Medications  potassium chloride SA (KLOR-CON) CR tablet 40 mEq (40 mEq Oral Given 07/29/20 2025)    ED Course  I have reviewed the triage vital signs and the nursing notes.  Pertinent labs & imaging results that were available during my care of the patient were reviewed by me and considered in my medical decision making (see chart for details).   Labs are all unremarkable including 2 troponins.  Suspect patient is having another anxiety and stress related episode MDM Rules/Calculators/A&P                         Patient with chest discomfort that is unlikely cardiac.  Most likely she has anxiety and will follow up but her doctor.  She did have some mild hypokalemia that was treated with some potassium p.o.Marland Kitchen  Cardiac enzymes and EKG unremarkable  Final Clinical Impression(s) / ED Diagnoses Final diagnoses:  Anxiety    Rx / DC Orders ED Discharge Orders    None       Milton Ferguson, MD 07/31/20 [id number]

## 2020-07-29 NOTE — Discharge Instructions (Signed)
Take your anxiety medicine as prescribed and follow-up with your doctor this week if any problem

## 2020-07-29 NOTE — ED Triage Notes (Signed)
Pt to the ed with complaints of generalized body aches and tingling with shortness of breath.

## 2020-08-07 DIAGNOSIS — E7849 Other hyperlipidemia: Secondary | ICD-10-CM | POA: Diagnosis not present

## 2020-08-07 DIAGNOSIS — G894 Chronic pain syndrome: Secondary | ICD-10-CM | POA: Diagnosis not present

## 2020-08-07 DIAGNOSIS — F4542 Pain disorder with related psychological factors: Secondary | ICD-10-CM | POA: Diagnosis not present

## 2020-08-29 ENCOUNTER — Encounter (INDEPENDENT_AMBULATORY_CARE_PROVIDER_SITE_OTHER): Payer: Self-pay | Admitting: *Deleted

## 2020-09-06 DIAGNOSIS — E782 Mixed hyperlipidemia: Secondary | ICD-10-CM | POA: Diagnosis not present

## 2020-09-06 DIAGNOSIS — G894 Chronic pain syndrome: Secondary | ICD-10-CM | POA: Diagnosis not present

## 2020-09-06 DIAGNOSIS — F4542 Pain disorder with related psychological factors: Secondary | ICD-10-CM | POA: Diagnosis not present

## 2020-09-11 ENCOUNTER — Encounter (INDEPENDENT_AMBULATORY_CARE_PROVIDER_SITE_OTHER): Payer: Self-pay | Admitting: *Deleted

## 2020-10-24 ENCOUNTER — Other Ambulatory Visit (HOSPITAL_COMMUNITY): Payer: Self-pay | Admitting: Neurology

## 2020-10-24 DIAGNOSIS — Z79891 Long term (current) use of opiate analgesic: Secondary | ICD-10-CM | POA: Diagnosis not present

## 2020-10-24 DIAGNOSIS — K5903 Drug induced constipation: Secondary | ICD-10-CM | POA: Diagnosis not present

## 2020-10-24 DIAGNOSIS — M542 Cervicalgia: Secondary | ICD-10-CM

## 2020-10-24 DIAGNOSIS — M545 Low back pain, unspecified: Secondary | ICD-10-CM | POA: Diagnosis not present

## 2020-10-24 DIAGNOSIS — I10 Essential (primary) hypertension: Secondary | ICD-10-CM | POA: Diagnosis not present

## 2020-10-24 DIAGNOSIS — Q8500 Neurofibromatosis, unspecified: Secondary | ICD-10-CM | POA: Diagnosis not present

## 2020-10-24 DIAGNOSIS — D329 Benign neoplasm of meninges, unspecified: Secondary | ICD-10-CM | POA: Diagnosis not present

## 2020-10-24 DIAGNOSIS — G4489 Other headache syndrome: Secondary | ICD-10-CM | POA: Diagnosis not present

## 2020-10-24 DIAGNOSIS — G43009 Migraine without aura, not intractable, without status migrainosus: Secondary | ICD-10-CM | POA: Diagnosis not present

## 2020-10-25 DIAGNOSIS — E782 Mixed hyperlipidemia: Secondary | ICD-10-CM | POA: Diagnosis not present

## 2020-10-25 DIAGNOSIS — Z6827 Body mass index (BMI) 27.0-27.9, adult: Secondary | ICD-10-CM | POA: Diagnosis not present

## 2020-10-25 DIAGNOSIS — F419 Anxiety disorder, unspecified: Secondary | ICD-10-CM | POA: Diagnosis not present

## 2020-10-25 DIAGNOSIS — E663 Overweight: Secondary | ICD-10-CM | POA: Diagnosis not present

## 2020-10-25 DIAGNOSIS — E538 Deficiency of other specified B group vitamins: Secondary | ICD-10-CM | POA: Diagnosis not present

## 2020-10-25 DIAGNOSIS — I10 Essential (primary) hypertension: Secondary | ICD-10-CM | POA: Diagnosis not present

## 2020-11-03 ENCOUNTER — Other Ambulatory Visit: Payer: Self-pay

## 2020-11-03 ENCOUNTER — Emergency Department (HOSPITAL_COMMUNITY)
Admission: EM | Admit: 2020-11-03 | Discharge: 2020-11-04 | Disposition: A | Discharge status: Home / Self Care | Payer: Medicare HMO | Source: Home / Self Care | Attending: Emergency Medicine | Admitting: Emergency Medicine

## 2020-11-03 ENCOUNTER — Encounter (HOSPITAL_COMMUNITY): Payer: Self-pay | Admitting: Emergency Medicine

## 2020-11-03 DIAGNOSIS — R7401 Elevation of levels of liver transaminase levels: Secondary | ICD-10-CM | POA: Diagnosis not present

## 2020-11-03 DIAGNOSIS — K529 Noninfective gastroenteritis and colitis, unspecified: Secondary | ICD-10-CM | POA: Diagnosis not present

## 2020-11-03 DIAGNOSIS — I10 Essential (primary) hypertension: Secondary | ICD-10-CM | POA: Insufficient documentation

## 2020-11-03 DIAGNOSIS — R1013 Epigastric pain: Secondary | ICD-10-CM | POA: Diagnosis not present

## 2020-11-03 DIAGNOSIS — E872 Acidosis: Secondary | ICD-10-CM | POA: Diagnosis not present

## 2020-11-03 DIAGNOSIS — Z20822 Contact with and (suspected) exposure to covid-19: Secondary | ICD-10-CM | POA: Diagnosis not present

## 2020-11-03 DIAGNOSIS — Z79899 Other long term (current) drug therapy: Secondary | ICD-10-CM | POA: Insufficient documentation

## 2020-11-03 DIAGNOSIS — R112 Nausea with vomiting, unspecified: Secondary | ICD-10-CM

## 2020-11-03 DIAGNOSIS — R569 Unspecified convulsions: Secondary | ICD-10-CM | POA: Diagnosis not present

## 2020-11-03 DIAGNOSIS — Y900 Blood alcohol level of less than 20 mg/100 ml: Secondary | ICD-10-CM | POA: Diagnosis not present

## 2020-11-03 DIAGNOSIS — D72829 Elevated white blood cell count, unspecified: Secondary | ICD-10-CM | POA: Diagnosis not present

## 2020-11-03 DIAGNOSIS — E876 Hypokalemia: Secondary | ICD-10-CM | POA: Diagnosis not present

## 2020-11-03 MED ORDER — ONDANSETRON 4 MG PO TBDP
4.0000 mg | ORAL_TABLET | Freq: Three times a day (TID) | ORAL | 0 refills | Status: DC | PRN
Start: 2020-11-03 — End: 2021-02-07

## 2020-11-03 MED ORDER — ONDANSETRON 4 MG PO TBDP
4.0000 mg | ORAL_TABLET | Freq: Once | ORAL | Status: AC
  Administered 2020-11-03: 4 mg via ORAL
  Filled 2020-11-03: qty 1

## 2020-11-03 MED ORDER — LOPERAMIDE HCL 2 MG PO CAPS
2.0000 mg | ORAL_CAPSULE | Freq: Four times a day (QID) | ORAL | 0 refills | Status: DC | PRN
Start: 2020-11-03 — End: 2020-11-05

## 2020-11-03 NOTE — ED Triage Notes (Addendum)
Patient c/o nausea and vomiting that started today. Per patient was outside at yard sale when nausea. Denies any abd pain, fevers, or urinary symptoms. Per patient now short of breath and anxious in which patient states "I think I'm having a panic attack." Patient reports hx of anxiety.

## 2020-11-03 NOTE — ED Provider Notes (Signed)
[facility] EMERGENCY DEPARTMENT Provider Note  CSN: [id number] Arrival date & time: 11/03/20 1440    History Chief Complaint  Patient presents with   Emesis     Julia [name] is a 60 y.o. female with history of anxiety reports she was out going to a few yard sales today and then went to get something to eat. She began feeling nauseated after she got home, vomited up her lunch and some water she tried to drink. She began feeling anxious, jittery so she took one of her regularly prescribed Xanax and began to feel better. She has not had any abdominal pain, fever. She has been in her usual state of health the last few days. She does report she went to one of her doctors about 10 days ago for her chronic headaches and was given a new medication which she does not remember the name of. She took two doses and felt worse so she stopped taking it. She saw her PCP the next day where her BP was reportedly elevated so her HTN meds were adjusted.    Past Medical History:  Diagnosis Date   Anxiety    Brain tumor (benign) ([facility])    Chronic pain    Elevated cholesterol    Headache(784.0)    Hypertension    Numerous moles    Trichimoniasis     Past Surgical History:  Procedure Laterality Date   EXCISION OF SKIN TAG N/A 09/22/2017   Procedure: EXCISION OF SKIN TAGS ON FACE AND NECK (Procedure #2);  Surgeon: Jonnie Kind, MD;  Location: [facility];  Service: Gynecology;  Laterality: N/A;   HYSTEROSCOPY WITH D & C N/A 09/22/2017   Procedure: DILATATION AND CURETTAGE /HYSTEROSCOPY; EXCISION OF VULVAR AND RIGHT AND LEFT THIGH SKIN TAGS (Procedure #1);  Surgeon: Jonnie Kind, MD;  Location: [facility];  Service: Gynecology;  Laterality: N/A;   POLYPECTOMY N/A 09/22/2017   Procedure: REMOVAL OF ENDOMETRIAL POLYP (Procedure #1);  Surgeon: Jonnie Kind, MD;  Location: [facility];  Service: Gynecology;  Laterality: N/A;   TUBAL LIGATION      Family History  Problem Relation Age of Onset   Stroke  Mother    Heart failure Mother    Cancer Father        lung   Diabetes Brother     Social History   Tobacco Use   Smoking status: Never   Smokeless tobacco: Never  Vaping Use   Vaping Use: Never used  Substance Use Topics   Alcohol use: No   Drug use: No     Home Medications Prior to Admission medications   Medication Sig Start Date End Date Taking? Authorizing Provider  loperamide (IMODIUM) 2 MG capsule Take 1 capsule (2 mg total) by mouth 4 (four) times daily as needed for diarrhea or loose stools. 11/03/20  Yes Truddie Hidden, MD  ondansetron (ZOFRAN ODT) 4 MG disintegrating tablet Take 1 tablet (4 mg total) by mouth every 8 (eight) hours as needed for nausea or vomiting. 11/03/20  Yes Truddie Hidden, MD  acetaminophen (TYLENOL) 325 MG tablet Take 650 mg by mouth every 6 (six) hours as needed for moderate pain or headache.    [provider]  albuterol (PROVENTIL HFA;VENTOLIN HFA) 108 (90 Base) MCG/ACT inhaler  09/18/17   [provider]  ALPRAZolam Duanne Moron) 1 MG tablet Take 1 mg by mouth 3 (three) times daily as needed for anxiety.     [provider]  amLODipine (NORVASC) 10 MG tablet Take 10 mg by mouth daily.  07/10/17   [provider]  cyclobenzaprine (FLEXERIL) 10 MG tablet Take 10 mg by mouth 2 (two) times daily as needed for muscle spasms. 07/26/20   [provider]  ezetimibe (ZETIA) 10 MG tablet Take 10 mg by mouth daily.  08/15/16   [provider]  fluticasone (FLONASE) 50 MCG/ACT nasal spray Place 1 spray into both nostrils daily for 14 days. 10/26/19 11/09/19  Avegno, Darrelyn Hillock, FNP  gabapentin (NEURONTIN) 300 MG capsule Take 300 mg by mouth 2 (two) times daily. 07/26/20   [provider]  hydrochlorothiazide (MICROZIDE) 12.5 MG capsule Take 12.5 mg by mouth daily. 05/28/20   [provider]  ibuprofen (ADVIL,MOTRIN) 200 MG tablet Take 200-400 mg by mouth every 6 (six) hours as needed for headache  or moderate pain.    [provider]  losartan (COZAAR) 50 MG tablet Take 50 mg by mouth daily.  09/01/13   [provider]  ondansetron (ZOFRAN) 4 MG tablet Take 1 tablet (4 mg total) by mouth every 6 (six) hours. 03/16/20   Faustino Congress, NP  oxyCODONE-acetaminophen (PERCOCET/ROXICET) 5-325 MG tablet Take 1 tablet by mouth 2 (two) times daily as needed for pain. 07/26/20   [provider]  PARoxetine (PAXIL) 30 MG tablet Take 30 mg by mouth at bedtime.  05/22/12   [provider]  pravastatin (PRAVACHOL) 40 MG tablet Take 40 mg by mouth daily. 05/20/12   [provider]  propranolol (INDERAL) 40 MG tablet Take 40 mg by mouth 2 (two) times daily. 07/26/20   [provider]  tiZANidine (ZANAFLEX) 4 MG tablet Take 4 mg by mouth 3 (three) times daily. 05/28/20   [provider]  topiramate (TOPAMAX) 100 MG tablet Take 100 mg by mouth at bedtime. 07/26/20   [provider]     Allergies    Naprosyn [naproxen], Penicillins, and Promethazine hcl   Review of Systems   Review of Systems A comprehensive review of systems was completed and negative except as noted in HPI.    Physical Exam BP (!) 148/80 (BP Location: Right Arm)   Pulse 74   Temp (!) 97.5 F (36.4 C) (Oral)   Resp 20   Ht 5' (1.524 m)   Wt 64.9 kg   LMP 09/06/2012   SpO2 100%   BMI 27.93 kg/m   Physical Exam Vitals and nursing note reviewed.  Constitutional:      Appearance: Normal appearance.  HENT:     Head: Normocephalic and atraumatic.     Nose: Nose normal.     Mouth/Throat:     Mouth: Mucous membranes are moist.  Eyes:     Extraocular Movements: Extraocular movements intact.     Conjunctiva/sclera: Conjunctivae normal.  Cardiovascular:     Rate and Rhythm: Normal rate.  Pulmonary:     Effort: Pulmonary effort is normal.     Breath sounds: Normal breath sounds.  Abdominal:     General: Abdomen is flat.     Palpations: Abdomen is soft.      Tenderness: There is no abdominal tenderness.  Musculoskeletal:        General: No swelling. Normal range of motion.     Cervical back: Neck supple.  Skin:    General: Skin is warm and dry.  Neurological:     General: No focal deficit present.     Mental Status: She is alert.  Psychiatric:  Mood and Affect: Mood normal.     ED Results / Procedures / Treatments   Labs (all labs ordered are listed, but only abnormal results are displayed) Labs Reviewed - No data to display  EKG EKG Interpretation  Date/Time:  Saturday November 03 2020 15:02:10 EDT Ventricular Rate:  69 PR Interval:  144 QRS Duration: 88 QT Interval:  422 QTC Calculation: 452 R Axis:   38 Text Interpretation: Normal sinus rhythm Normal ECG No significant change since last tracing Confirmed by Calvert Cantor [phone]) on 11/03/2020 3:07:06 PM  Radiology No results found.  Procedures Procedures  Medications Ordered in the ED Medications  ondansetron (ZOFRAN-ODT) disintegrating tablet 4 mg (4 mg Oral Given 11/03/20 1626)     MDM Rules/Calculators/A&P MDM Patient with nausea, anxiety. Otherwise has been doing well recently. Took Xanax PTA. Will give ODT Zofran and reassess.   ED Course  I have reviewed the triage vital signs and the nursing notes.  Pertinent labs & imaging results that were available during my care of the patient were reviewed by me and considered in my medical decision making (see chart for details).  Clinical Course as of 11/03/20 1750  Sat Nov 03, 2020  1748 Patient feeling better after Zofran and wants to go home. Asking for Rx for Zofran and 'something for diarrhea' which she had not mentioned before. Recommend close PCP follow up.  [CS]    Clinical Course User Index [CS] Truddie Hidden, MD    Final Clinical Impression(s) / ED Diagnoses Final diagnoses:  Non-intractable vomiting with nausea, unspecified vomiting type    Rx / DC Orders ED Discharge Orders           Ordered    ondansetron (ZOFRAN ODT) 4 MG disintegrating tablet  Every 8 hours PRN        11/03/20 1750    loperamide (IMODIUM) 2 MG capsule  4 times daily PRN        11/03/20 1750             Truddie Hidden, MD 11/03/20 1750

## 2020-11-04 ENCOUNTER — Other Ambulatory Visit: Payer: Self-pay

## 2020-11-04 ENCOUNTER — Emergency Department (HOSPITAL_COMMUNITY): Payer: Medicare HMO

## 2020-11-04 ENCOUNTER — Observation Stay (HOSPITAL_COMMUNITY)
Admission: EM | Admit: 2020-11-04 | Discharge: 2020-11-05 | Disposition: A | Discharge status: Home / Self Care | Payer: Medicare HMO | Attending: Internal Medicine | Admitting: Internal Medicine

## 2020-11-04 DIAGNOSIS — Z79891 Long term (current) use of opiate analgesic: Secondary | ICD-10-CM | POA: Insufficient documentation

## 2020-11-04 DIAGNOSIS — G8929 Other chronic pain: Secondary | ICD-10-CM | POA: Insufficient documentation

## 2020-11-04 DIAGNOSIS — Z79899 Other long term (current) drug therapy: Secondary | ICD-10-CM | POA: Insufficient documentation

## 2020-11-04 DIAGNOSIS — E785 Hyperlipidemia, unspecified: Secondary | ICD-10-CM

## 2020-11-04 DIAGNOSIS — I10 Essential (primary) hypertension: Secondary | ICD-10-CM | POA: Diagnosis present

## 2020-11-04 DIAGNOSIS — D72829 Elevated white blood cell count, unspecified: Secondary | ICD-10-CM

## 2020-11-04 DIAGNOSIS — E876 Hypokalemia: Secondary | ICD-10-CM

## 2020-11-04 DIAGNOSIS — R1013 Epigastric pain: Secondary | ICD-10-CM | POA: Diagnosis not present

## 2020-11-04 DIAGNOSIS — Z20822 Contact with and (suspected) exposure to covid-19: Secondary | ICD-10-CM | POA: Insufficient documentation

## 2020-11-04 DIAGNOSIS — G43009 Migraine without aura, not intractable, without status migrainosus: Secondary | ICD-10-CM | POA: Insufficient documentation

## 2020-11-04 DIAGNOSIS — Q8500 Neurofibromatosis, unspecified: Secondary | ICD-10-CM | POA: Insufficient documentation

## 2020-11-04 DIAGNOSIS — R569 Unspecified convulsions: Secondary | ICD-10-CM | POA: Diagnosis not present

## 2020-11-04 DIAGNOSIS — R739 Hyperglycemia, unspecified: Secondary | ICD-10-CM

## 2020-11-04 DIAGNOSIS — K529 Noninfective gastroenteritis and colitis, unspecified: Principal | ICD-10-CM | POA: Insufficient documentation

## 2020-11-04 DIAGNOSIS — D329 Benign neoplasm of meninges, unspecified: Secondary | ICD-10-CM | POA: Insufficient documentation

## 2020-11-04 DIAGNOSIS — R0902 Hypoxemia: Secondary | ICD-10-CM | POA: Diagnosis not present

## 2020-11-04 DIAGNOSIS — R112 Nausea with vomiting, unspecified: Secondary | ICD-10-CM | POA: Diagnosis not present

## 2020-11-04 DIAGNOSIS — R55 Syncope and collapse: Secondary | ICD-10-CM | POA: Diagnosis not present

## 2020-11-04 DIAGNOSIS — E872 Acidosis, unspecified: Secondary | ICD-10-CM

## 2020-11-04 DIAGNOSIS — M545 Low back pain, unspecified: Secondary | ICD-10-CM | POA: Insufficient documentation

## 2020-11-04 DIAGNOSIS — R1084 Generalized abdominal pain: Secondary | ICD-10-CM | POA: Diagnosis not present

## 2020-11-04 DIAGNOSIS — R519 Headache, unspecified: Secondary | ICD-10-CM | POA: Insufficient documentation

## 2020-11-04 DIAGNOSIS — Y900 Blood alcohol level of less than 20 mg/100 ml: Secondary | ICD-10-CM | POA: Insufficient documentation

## 2020-11-04 DIAGNOSIS — R4020 Unspecified coma: Secondary | ICD-10-CM | POA: Diagnosis not present

## 2020-11-04 DIAGNOSIS — F419 Anxiety disorder, unspecified: Secondary | ICD-10-CM

## 2020-11-04 DIAGNOSIS — R7401 Elevation of levels of liver transaminase levels: Secondary | ICD-10-CM

## 2020-11-04 DIAGNOSIS — R109 Unspecified abdominal pain: Secondary | ICD-10-CM

## 2020-11-04 DIAGNOSIS — M542 Cervicalgia: Secondary | ICD-10-CM | POA: Insufficient documentation

## 2020-11-04 LAB — LACTIC ACID, PLASMA
Lactic Acid, Venous: 5.1 mmol/L (ref 0.5–1.9)
Lactic Acid, Venous: 2.2 mmol/L (ref 0.5–1.9)

## 2020-11-04 LAB — COMPREHENSIVE METABOLIC PANEL
ALT: 27 U/L (ref 0–44)
AST: 50 U/L — ABNORMAL HIGH (ref 15–41)
Albumin: 4.4 g/dL (ref 3.5–5.0)
Alkaline Phosphatase: 87 U/L (ref 38–126)
Anion gap: 13 (ref 5–15)
BUN: 16 mg/dL (ref 8–23)
CO2: 21 mmol/L — ABNORMAL LOW (ref 22–32)
Calcium: 10 mg/dL (ref 8.9–10.3)
Chloride: 105 mmol/L (ref 98–111)
Creatinine, Ser: 0.94 mg/dL (ref 0.44–1.00)
GFR, Estimated: >60 mL/min (ref >60)
Glucose, Bld: 175 mg/dL — ABNORMAL HIGH (ref 70–99)
Potassium: 2.4 mmol/L — CL (ref 3.5–5.1)
Sodium: 139 mmol/L (ref 135–145)
Total Bilirubin: 3.3 mg/dL — ABNORMAL HIGH (ref 0.3–1.2)
Total Protein: 7.4 g/dL (ref 6.5–8.1)

## 2020-11-04 LAB — CBC WITH DIFFERENTIAL/PLATELET
Abs Immature Granulocytes: 0.06 10*3/uL (ref 0.00–0.07)
Basophils Absolute: 0 10*3/uL (ref 0.0–0.1)
Basophils Relative: 0 %
Eosinophils Absolute: 0 10*3/uL (ref 0.0–0.5)
Eosinophils Relative: 0 %
HCT: 41.7 % (ref 36.0–46.0)
Hemoglobin: 14.6 g/dL (ref 12.0–15.0)
Immature Granulocytes: 1 %
Lymphocytes Relative: 22 %
Lymphs Abs: 2.4 10*3/uL (ref 0.7–4.0)
MCH: 30.4 pg (ref 26.0–34.0)
MCHC: 35 g/dL (ref 30.0–36.0)
MCV: 86.7 fL (ref 80.0–100.0)
Monocytes Absolute: 0.7 10*3/uL (ref 0.1–1.0)
Monocytes Relative: 6 %
Neutro Abs: 7.7 10*3/uL (ref 1.7–7.7)
Neutrophils Relative %: 71 %
Platelets: 245 10*3/uL (ref 150–400)
RBC: 4.81 MIL/uL (ref 3.87–5.11)
RDW: 13.8 % (ref 11.5–15.5)
WBC: 10.9 10*3/uL — ABNORMAL HIGH (ref 4.0–10.5)
nRBC: 0 % (ref 0.0–0.2)

## 2020-11-04 LAB — RESP PANEL BY RT-PCR (FLU A&B, COVID) ARPGX2
Influenza A by PCR: NEGATIVE
Influenza B by PCR: NEGATIVE
SARS Coronavirus 2 by RT PCR: NEGATIVE

## 2020-11-04 LAB — LIPASE, BLOOD: Lipase: 23 U/L (ref 11–51)

## 2020-11-04 LAB — ETHANOL: Alcohol, Ethyl (B): <10 mg/dL (ref <10)

## 2020-11-04 MED ORDER — ENOXAPARIN SODIUM 40 MG/0.4ML IJ SOSY
40.0000 mg | PREFILLED_SYRINGE | Freq: Every day | INTRAMUSCULAR | Status: DC
  Administered 2020-11-05: 40 mg via SUBCUTANEOUS
  Filled 2020-11-04: qty 0.4

## 2020-11-04 MED ORDER — FENTANYL CITRATE PF 50 MCG/ML IJ SOSY
50.0000 ug | PREFILLED_SYRINGE | Freq: Once | INTRAMUSCULAR | Status: AC
Start: 2020-11-04 — End: 2020-11-04
  Administered 2020-11-04: 50 ug via INTRAVENOUS
  Filled 2020-11-04: qty 1

## 2020-11-04 MED ORDER — IOHEXOL 350 MG/ML SOLN
100.0000 mL | Freq: Once | INTRAVENOUS | Status: AC | PRN
  Administered 2020-11-04: 60 mL via INTRAVENOUS

## 2020-11-04 MED ORDER — PANTOPRAZOLE SODIUM 40 MG IV SOLR
40.0000 mg | Freq: Once | INTRAVENOUS | Status: AC
  Administered 2020-11-04: 40 mg via INTRAVENOUS
  Filled 2020-11-04: qty 40

## 2020-11-04 MED ORDER — POTASSIUM CHLORIDE 10 MEQ/100ML IV SOLN
10.0000 meq | INTRAVENOUS | Status: AC
  Administered 2020-11-04 – 2020-11-05 (×4): 10 meq via INTRAVENOUS
  Filled 2020-11-04 (×4): qty 100

## 2020-11-04 MED ORDER — SODIUM CHLORIDE 0.9 % IV BOLUS
1000.0000 mL | Freq: Once | INTRAVENOUS | Status: AC
  Administered 2020-11-04: 1000 mL via INTRAVENOUS

## 2020-11-04 MED ORDER — ONDANSETRON HCL 4 MG/2ML IJ SOLN
4.0000 mg | Freq: Once | INTRAMUSCULAR | Status: AC
  Administered 2020-11-04: 4 mg via INTRAVENOUS
  Filled 2020-11-04: qty 2

## 2020-11-04 MED ORDER — MAGNESIUM SULFATE 2 GM/50ML IV SOLN
2.0000 g | Freq: Once | INTRAVENOUS | Status: AC
  Administered 2020-11-04: 2 g via INTRAVENOUS
  Filled 2020-11-04: qty 50

## 2020-11-04 NOTE — ED Notes (Signed)
Lactic Acid 5.1 and Potassium 2.6 reported to [facility] and primary RN at this time

## 2020-11-04 NOTE — ED Provider Notes (Signed)
[facility] EMERGENCY DEPARTMENT Provider Note   CSN: [id number] Arrival date & time: 11/04/20  1947     History Chief Complaint  Patient presents with   Seizures   Loss of Consciousness    Julia [name] is a 60 y.o. female.  Per EMS they were called out for patient having a unresponsive episode followed by seizure-like activity.  Patient states she called EMS because she has had abdominal pain and vomiting for the last few hours.  History of abdominal pain and vomiting.  History of anxiety.  She denies that she had a seizure today and has no history of seizures.  Denies any headache numbness or weakness.  The history is provided by the patient and the EMS personnel.  Abdominal Pain Pain location:  Epigastric Pain quality: cramping   Pain severity:  Severe Onset quality:  Gradual Duration:  2 hours Timing:  Constant Progression:  Unchanged Chronicity:  Recurrent Context: not trauma   Relieved by:  None tried Worsened by:  Nothing Ineffective treatments:  None tried Associated symptoms: nausea and vomiting   Associated symptoms: no chest pain, no cough, no diarrhea, no dysuria, no fever and no sore throat       Past Medical History:  Diagnosis Date   Anxiety    Brain tumor (benign) (HCC)    Chronic pain    Elevated cholesterol    Headache(784.0)    Hypertension    Numerous moles    Trichimoniasis     Patient Active Problem List   Diagnosis Date Noted   Thickened endometrium 06/17/2017   Screening for colorectal cancer 06/09/2017   Encounter for gynecological examination with Papanicolaou smear of cervix 06/09/2017   PMB (postmenopausal bleeding) 06/09/2017   Constipation - functional 09/30/2012   Colon cancer screening 09/30/2012   Trichomoniasis of vagina 08/31/2012   Pelvic relaxation due to cystocele, midline 06/02/2012   Rectocele 06/02/2012    Past Surgical History:  Procedure Laterality Date   EXCISION OF SKIN TAG N/A 09/22/2017   Procedure:  EXCISION OF SKIN TAGS ON FACE AND NECK (Procedure #2);  Surgeon: Jonnie Kind, MD;  Location: [facility];  Service: Gynecology;  Laterality: N/A;   HYSTEROSCOPY WITH D & C N/A 09/22/2017   Procedure: DILATATION AND CURETTAGE /HYSTEROSCOPY; EXCISION OF VULVAR AND RIGHT AND LEFT THIGH SKIN TAGS (Procedure #1);  Surgeon: Jonnie Kind, MD;  Location: [facility];  Service: Gynecology;  Laterality: N/A;   POLYPECTOMY N/A 09/22/2017   Procedure: REMOVAL OF ENDOMETRIAL POLYP (Procedure #1);  Surgeon: Jonnie Kind, MD;  Location: [facility];  Service: Gynecology;  Laterality: N/A;   TUBAL LIGATION       OB History     Gravida  3   Para  3   Term      Preterm      AB      Living  3      SAB      IAB      Ectopic      Multiple      Live Births              Family History  Problem Relation Age of Onset   Stroke Mother    Heart failure Mother    Cancer Father        lung   Diabetes Brother     Social History   Tobacco Use   Smoking status: Never   Smokeless tobacco: Never  Vaping Use  Vaping Use: Never used  Substance Use Topics   Alcohol use: No   Drug use: No    Home Medications Prior to Admission medications   Medication Sig Start Date End Date Taking? Authorizing Provider  acetaminophen (TYLENOL) 325 MG tablet Take 650 mg by mouth every 6 (six) hours as needed for moderate pain or headache.    [provider]  albuterol (PROVENTIL HFA;VENTOLIN HFA) 108 (90 Base) MCG/ACT inhaler  09/18/17   [provider]  ALPRAZolam Duanne Moron) 1 MG tablet Take 1 mg by mouth 3 (three) times daily as needed for anxiety.     [provider]  amLODipine (NORVASC) 10 MG tablet Take 10 mg by mouth daily.  07/10/17   [provider]  cyclobenzaprine (FLEXERIL) 10 MG tablet Take 10 mg by mouth 2 (two) times daily as needed for muscle spasms. 07/26/20   [provider]  ezetimibe (ZETIA) 10 MG tablet Take 10 mg by mouth daily.  08/15/16    [provider]  fluticasone (FLONASE) 50 MCG/ACT nasal spray Place 1 spray into both nostrils daily for 14 days. 10/26/19 11/09/19  Avegno, Darrelyn Hillock, FNP  gabapentin (NEURONTIN) 300 MG capsule Take 300 mg by mouth 2 (two) times daily. 07/26/20   [provider]  hydrochlorothiazide (MICROZIDE) 12.5 MG capsule Take 12.5 mg by mouth daily. 05/28/20   [provider]  ibuprofen (ADVIL,MOTRIN) 200 MG tablet Take 200-400 mg by mouth every 6 (six) hours as needed for headache or moderate pain.    [provider]  loperamide (IMODIUM) 2 MG capsule Take 1 capsule (2 mg total) by mouth 4 (four) times daily as needed for diarrhea or loose stools. 11/03/20   Truddie Hidden, MD  losartan (COZAAR) 50 MG tablet Take 50 mg by mouth daily.  09/01/13   [provider]  ondansetron (ZOFRAN ODT) 4 MG disintegrating tablet Take 1 tablet (4 mg total) by mouth every 8 (eight) hours as needed for nausea or vomiting. 11/03/20   Truddie Hidden, MD  ondansetron (ZOFRAN) 4 MG tablet Take 1 tablet (4 mg total) by mouth every 6 (six) hours. 03/16/20   Faustino Congress, NP  oxyCODONE-acetaminophen (PERCOCET/ROXICET) 5-325 MG tablet Take 1 tablet by mouth 2 (two) times daily as needed for pain. 07/26/20   [provider]  PARoxetine (PAXIL) 30 MG tablet Take 30 mg by mouth at bedtime.  05/22/12   [provider]  pravastatin (PRAVACHOL) 40 MG tablet Take 40 mg by mouth daily. 05/20/12   [provider]  propranolol (INDERAL) 40 MG tablet Take 40 mg by mouth 2 (two) times daily. 07/26/20   [provider]  tiZANidine (ZANAFLEX) 4 MG tablet Take 4 mg by mouth 3 (three) times daily. 05/28/20   [provider]  topiramate (TOPAMAX) 100 MG tablet Take 100 mg by mouth at bedtime. 07/26/20   [provider]    Allergies    Naprosyn [naproxen], Penicillins, and Promethazine hcl  Review of Systems   Review of Systems  Constitutional:   Negative for fever.  HENT:  Negative for sore throat.   Eyes:  Negative for visual disturbance.  Respiratory:  Negative for cough.   Cardiovascular:  Negative for chest pain.  Gastrointestinal:  Positive for abdominal pain, nausea and vomiting. Negative for diarrhea.  Genitourinary:  Negative for dysuria.  Musculoskeletal:  Negative for neck pain.  Skin:  Negative for wound.  Neurological:  Negative for headaches.   Physical Exam Updated Vital Signs  BP (!) 143/93 (BP Location: Right Arm)   Pulse (!) 109   Temp 98.5 F (36.9 C) (Oral)   Resp 14   Ht 5' (1.524 m)   Wt 64.9 kg   LMP 09/06/2012   SpO2 95%   BMI 27.93 kg/m   Physical Exam Vitals and nursing note reviewed.  Constitutional:      General: She is not in acute distress.    Appearance: Normal appearance. She is well-developed.  HENT:     Head: Normocephalic and atraumatic.  Eyes:     Conjunctiva/sclera: Conjunctivae normal.  Cardiovascular:     Rate and Rhythm: Normal rate and regular rhythm.     Heart sounds: No murmur heard. Pulmonary:     Effort: Pulmonary effort is normal. No respiratory distress.     Breath sounds: Normal breath sounds.  Abdominal:     Palpations: Abdomen is soft.     Tenderness: There is no abdominal tenderness. There is no guarding or rebound.  Musculoskeletal:        General: No deformity or signs of injury. Normal range of motion.     Cervical back: Neck supple.  Skin:    General: Skin is warm and dry.     Comments: She has been skin lesions over her body.  Neurological:     General: No focal deficit present.     Mental Status: She is alert and oriented to person, place, and time.     Cranial Nerves: No cranial nerve deficit.     Sensory: No sensory deficit.     Motor: No weakness.    ED Results / Procedures / Treatments   Labs (all labs ordered are listed, but only abnormal results are displayed) Labs Reviewed  COMPREHENSIVE METABOLIC PANEL - Abnormal; Notable for the  following components:      Result Value   Potassium 2.4 (*)    CO2 21 (*)    Glucose, Bld 175 (*)    AST 50 (*)    Total Bilirubin 3.3 (*)    All other components within normal limits  CBC WITH DIFFERENTIAL/PLATELET - Abnormal; Notable for the following components:   WBC 10.9 (*)    All other components within normal limits  LACTIC ACID, PLASMA - Abnormal; Notable for the following components:   Lactic Acid, Venous 5.1 (*)    All other components within normal limits  LACTIC ACID, PLASMA - Abnormal; Notable for the following components:   Lactic Acid, Venous 2.2 (*)    All other components within normal limits  URINALYSIS, ROUTINE W REFLEX MICROSCOPIC - Abnormal; Notable for the following components:   APPearance HAZY (*)    Hgb urine dipstick SMALL (*)    Ketones, ur 5 (*)    All other components within normal limits  RAPID URINE DRUG SCREEN, HOSP PERFORMED - Abnormal; Notable for the following components:   Opiates POSITIVE (*)    Benzodiazepines POSITIVE (*)    All other components within normal limits  COMPREHENSIVE METABOLIC PANEL - Abnormal; Notable for the following components:   Potassium 2.8 (*)    Total Protein 6.3 (*)    AST 49 (*)    Total Bilirubin 2.9 (*)    All other components within normal limits  CBC - Abnormal; Notable for the following components:   WBC 11.8 (*)    All other components within normal limits  PHOSPHORUS - Abnormal; Notable for the following components:   Phosphorus 1.6 (*)    All other  components within normal limits  RESP PANEL BY RT-PCR (FLU A&B, COVID) ARPGX2  ETHANOL  LIPASE, BLOOD  PROTIME-INR  APTT  MAGNESIUM  LACTIC ACID, PLASMA  LACTIC ACID, PLASMA  HIV ANTIBODY (ROUTINE TESTING W REFLEX)  BASIC METABOLIC PANEL    EKG EKG Interpretation  Date/Time:  Sunday November 04 2020 19:53:52 EDT Ventricular Rate:  100 PR Interval:    QRS Duration: 87 QT Interval:  354 QTC Calculation: 457 R Axis:   27 Text  Interpretation: Normal sinus rhythm with artifact RSR' in V1 or V2, right VCD or RVD Confirmed by Aletta Edouard [phone]) on 11/04/2020 8:08:32 PM Also confirmed by Aletta Edouard [phone]), editor Hattie Perch [phone])  on 11/05/2020 7:02:13 AM  Radiology CT Head Wo Contrast  Result Date: 11/04/2020 CLINICAL DATA:  Abdominal pain.  Seizure. EXAM: CT HEAD WITHOUT CONTRAST TECHNIQUE: Contiguous axial images were obtained from the base of the skull through the vertex without intravenous contrast. COMPARISON:  None. FINDINGS: Brain: There is no hemorrhage or extra-axial collection. Calcified right convexity meningioma is unchanged. The size and configuration of the ventricles and extra-axial CSF spaces are normal. The brain parenchyma is normal, without acute or chronic infarction. Vascular: No abnormal hyperdensity of the major intracranial arteries or dural venous sinuses. No intracranial atherosclerosis. Skull: The visualized skull base, calvarium and extracranial soft tissues are normal. Sinuses/Orbits: No fluid levels or advanced mucosal thickening of the visualized paranasal sinuses. No mastoid or middle ear effusion. The orbits are normal. IMPRESSION: 1. No acute intracranial abnormality. 2. Unchanged appearance of calcified right convexity meningioma. Electronically Signed   By: Ulyses Jarred M.D.   On: 11/04/2020 21:44   CT Abdomen Pelvis W Contrast  Result Date: 11/04/2020 CLINICAL DATA:  Epigastric pain EXAM: CT ABDOMEN AND PELVIS WITH CONTRAST TECHNIQUE: Multidetector CT imaging of the abdomen and pelvis was performed using the standard protocol following bolus administration of intravenous contrast. CONTRAST:  63m OMNIPAQUE IOHEXOL 350 MG/ML SOLN COMPARISON:  None. FINDINGS: LOWER CHEST: Normal. HEPATOBILIARY: Normal hepatic contours. No intra- or extrahepatic biliary dilatation. The gallbladder is normal. PANCREAS: Normal pancreas. No ductal dilatation or peripancreatic fluid collection.  SPLEEN: Multiple nodular low-density foci resolve on delayed images. This is likely normal. ADRENALS/URINARY TRACT: The adrenal glands are normal. No hydronephrosis, nephroureterolithiasis or solid renal mass. The urinary bladder is normal for degree of distention STOMACH/BOWEL: There is no hiatal hernia. Normal duodenal course and caliber. No small bowel dilatation or inflammation. No focal colonic abnormality. Normal appendix. VASCULAR/LYMPHATIC: Normal course and caliber of the major abdominal vessels. No abdominal or pelvic lymphadenopathy. REPRODUCTIVE: Normal uterus. No adnexal mass. MUSCULOSKELETAL. No bony spinal canal stenosis or focal osseous abnormality. OTHER: None. IMPRESSION: 1. No acute abnormality of the abdomen or pelvis. Electronically Signed   By: KUlyses JarredM.D.   On: 11/04/2020 21:42    Procedures Procedures   Medications Ordered in ED Medications  sodium chloride 0.9 % bolus 1,000 mL (has no administration in time range)  ondansetron (ZOFRAN) injection 4 mg (has no administration in time range)  pantoprazole (PROTONIX) injection 40 mg (has no administration in time range)    ED Course  I have reviewed the triage vital signs and the nursing notes.  Pertinent labs & imaging results that were available during my care of the patient were reviewed by me and considered in my medical decision making (see chart for details).  Clinical Course as of 11/05/20 [id number] Sun Nov 04, 2020  2057 Patient's lactate elevated at 5.1.  She is IV fluids going.  Her electrolytes are also abnormal with a low potassium of 2.4.  Have ordered IV potassium and magnesium. [MB]  2229 Daughter is here now and was able to confirm that patient had a probable tonic-clonic seizure that she stopped lasted a few minutes.  She said she was foaming at the mouth and was not responsive.  She also states she has had difficulties with nausea and vomiting and has not really eaten much over the last few days.  I  recommended to the patient that we admit her to the hospital so we can get her symptoms under control and she is agreeable. [MB]    Clinical Course User Index [MB] Hayden Rasmussen, MD   MDM Rules/Calculators/A&P                          This patient complains of nausea and vomiting possible seizure; this involves an extensive number of treatment Options and is a complaint that carries with it a high risk of complications and Morbidity. The differential includes seizure, metabolic derangement, gastritis, peptic ulcer disease, obstruction, intoxication, withdrawal  I ordered, reviewed and interpreted labs, which included CBC [place] elevated white count, normal hemoglobin, chemistries with low potassium, lactate elevated will need to be trended, urinalysis without signs of infection, talk screen positive for opiates and benzos, COVID testing negative I ordered medication IV fluids, IV potassium and magnesium, nausea medication, acid medication, pain medicine I ordered imaging studies which included CT head and CT abdomen and pelvis and I independently    visualized and interpreted imaging which showed no acute findings Additional history obtained from patient's daughter and EMS Previous records obtained and reviewed in epic, no prior history of seizures I consulted Dr. Josephine Cables [facility] hospitalist and discussed lab and imaging findings  Critical Interventions: None  After the interventions stated above, I reevaluated the patient and found patient symptoms to be improved.  She is agreeable to admission to the hospital for further management.   Final Clinical Impression(s) / ED Diagnoses Final diagnoses:  Seizure ([place])  Hypokalemia  Non-intractable vomiting with nausea, unspecified vomiting type  Lactic acid increased    Rx / DC Orders ED Discharge Orders     None        Hayden Rasmussen, MD 11/05/20 [phone]

## 2020-11-04 NOTE — ED Notes (Signed)
Patient transported to CT 

## 2020-11-04 NOTE — ED Triage Notes (Signed)
Patient brought in by [facility].  Called out for syncopal episode.  EMS states that family on scene states that the patient passed out and it was followed by a seizure that last a couple of minutes.  States that the family doesn't have a history of seizures.  Patient is conscious and alert at triage.

## 2020-11-05 ENCOUNTER — Observation Stay (HOSPITAL_COMMUNITY)
Admit: 2020-11-05 | Discharge: 2020-11-05 | Disposition: A | Discharge status: Home / Self Care | Payer: Medicare HMO | Attending: Internal Medicine | Admitting: Internal Medicine

## 2020-11-05 DIAGNOSIS — D72829 Elevated white blood cell count, unspecified: Secondary | ICD-10-CM

## 2020-11-05 DIAGNOSIS — E876 Hypokalemia: Secondary | ICD-10-CM

## 2020-11-05 DIAGNOSIS — F419 Anxiety disorder, unspecified: Secondary | ICD-10-CM

## 2020-11-05 DIAGNOSIS — R109 Unspecified abdominal pain: Secondary | ICD-10-CM

## 2020-11-05 DIAGNOSIS — R739 Hyperglycemia, unspecified: Secondary | ICD-10-CM

## 2020-11-05 DIAGNOSIS — R7401 Elevation of levels of liver transaminase levels: Secondary | ICD-10-CM

## 2020-11-05 DIAGNOSIS — R1030 Lower abdominal pain, unspecified: Secondary | ICD-10-CM | POA: Diagnosis not present

## 2020-11-05 DIAGNOSIS — I10 Essential (primary) hypertension: Secondary | ICD-10-CM | POA: Diagnosis not present

## 2020-11-05 DIAGNOSIS — E782 Mixed hyperlipidemia: Secondary | ICD-10-CM | POA: Diagnosis not present

## 2020-11-05 DIAGNOSIS — E872 Acidosis, unspecified: Secondary | ICD-10-CM

## 2020-11-05 DIAGNOSIS — K529 Noninfective gastroenteritis and colitis, unspecified: Secondary | ICD-10-CM | POA: Diagnosis not present

## 2020-11-05 DIAGNOSIS — E785 Hyperlipidemia, unspecified: Secondary | ICD-10-CM

## 2020-11-05 DIAGNOSIS — R112 Nausea with vomiting, unspecified: Secondary | ICD-10-CM

## 2020-11-05 DIAGNOSIS — R569 Unspecified convulsions: Secondary | ICD-10-CM

## 2020-11-05 LAB — URINALYSIS, ROUTINE W REFLEX MICROSCOPIC
Bacteria, UA: NONE SEEN
Bilirubin Urine: NEGATIVE
Glucose, UA: NEGATIVE mg/dL
Ketones, ur: 5 mg/dL — AB
Leukocytes,Ua: NEGATIVE
Nitrite: NEGATIVE
Protein, ur: NEGATIVE mg/dL
Specific Gravity, Urine: 1.027 (ref 1.005–1.030)
pH: 8 (ref 5.0–8.0)

## 2020-11-05 LAB — CBC
HCT: 38 % (ref 36.0–46.0)
Hemoglobin: 13 g/dL (ref 12.0–15.0)
MCH: 30.5 pg (ref 26.0–34.0)
MCHC: 34.2 g/dL (ref 30.0–36.0)
MCV: 89.2 fL (ref 80.0–100.0)
Platelets: 200 10*3/uL (ref 150–400)
RBC: 4.26 MIL/uL (ref 3.87–5.11)
RDW: 14.1 % (ref 11.5–15.5)
WBC: 11.8 10*3/uL — ABNORMAL HIGH (ref 4.0–10.5)
nRBC: 0 % (ref 0.0–0.2)

## 2020-11-05 LAB — BASIC METABOLIC PANEL
Anion gap: 5 (ref 5–15)
BUN: 9 mg/dL (ref 8–23)
CO2: 25 mmol/L (ref 22–32)
Calcium: 9.2 mg/dL (ref 8.9–10.3)
Chloride: 111 mmol/L (ref 98–111)
Creatinine, Ser: 0.79 mg/dL (ref 0.44–1.00)
GFR, Estimated: >60 mL/min (ref >60)
Glucose, Bld: 100 mg/dL — ABNORMAL HIGH (ref 70–99)
Potassium: 3.1 mmol/L — ABNORMAL LOW (ref 3.5–5.1)
Sodium: 141 mmol/L (ref 135–145)

## 2020-11-05 LAB — COMPREHENSIVE METABOLIC PANEL
ALT: 26 U/L (ref 0–44)
AST: 49 U/L — ABNORMAL HIGH (ref 15–41)
Albumin: 3.7 g/dL (ref 3.5–5.0)
Alkaline Phosphatase: 75 U/L (ref 38–126)
Anion gap: 8 (ref 5–15)
BUN: 11 mg/dL (ref 8–23)
CO2: 26 mmol/L (ref 22–32)
Calcium: 9.4 mg/dL (ref 8.9–10.3)
Chloride: 110 mmol/L (ref 98–111)
Creatinine, Ser: 0.8 mg/dL (ref 0.44–1.00)
GFR, Estimated: >60 mL/min (ref >60)
Glucose, Bld: 96 mg/dL (ref 70–99)
Potassium: 2.8 mmol/L — ABNORMAL LOW (ref 3.5–5.1)
Sodium: 144 mmol/L (ref 135–145)
Total Bilirubin: 2.9 mg/dL — ABNORMAL HIGH (ref 0.3–1.2)
Total Protein: 6.3 g/dL — ABNORMAL LOW (ref 6.5–8.1)

## 2020-11-05 LAB — HIV ANTIBODY (ROUTINE TESTING W REFLEX): HIV Screen 4th Generation wRfx: NONREACTIVE

## 2020-11-05 LAB — APTT: aPTT: 24 seconds (ref 24–36)

## 2020-11-05 LAB — RAPID URINE DRUG SCREEN, HOSP PERFORMED
Amphetamines: NOT DETECTED
Barbiturates: NOT DETECTED
Benzodiazepines: POSITIVE — AB
Cocaine: NOT DETECTED
Opiates: POSITIVE — AB
Tetrahydrocannabinol: NOT DETECTED

## 2020-11-05 LAB — PROTIME-INR
INR: 1 (ref 0.8–1.2)
Prothrombin Time: 13.6 seconds (ref 11.4–15.2)

## 2020-11-05 LAB — PHOSPHORUS: Phosphorus: 1.6 mg/dL — ABNORMAL LOW (ref 2.5–4.6)

## 2020-11-05 LAB — MAGNESIUM: Magnesium: 2.2 mg/dL (ref 1.7–2.4)

## 2020-11-05 LAB — LACTIC ACID, PLASMA
Lactic Acid, Venous: 1.3 mmol/L (ref 0.5–1.9)
Lactic Acid, Venous: 1.1 mmol/L (ref 0.5–1.9)

## 2020-11-05 MED ORDER — ALPRAZOLAM 0.5 MG PO TABS: 1.0000 mg | ORAL_TABLET | Freq: Two times a day (BID) | ORAL | Status: DC | PRN

## 2020-11-05 MED ORDER — AMLODIPINE BESYLATE 5 MG PO TABS
10.0000 mg | ORAL_TABLET | Freq: Every day | ORAL | Status: DC
  Filled 2020-11-05: qty 2

## 2020-11-05 MED ORDER — ALBUTEROL SULFATE (2.5 MG/3ML) 0.083% IN NEBU: 2.5000 mg | INHALATION_SOLUTION | Freq: Four times a day (QID) | RESPIRATORY_TRACT | Status: DC | PRN

## 2020-11-05 MED ORDER — PROPRANOLOL HCL 10 MG PO TABS
40.0000 mg | ORAL_TABLET | Freq: Two times a day (BID) | ORAL | Status: DC
  Filled 2020-11-05: qty 4

## 2020-11-05 MED ORDER — ALPRAZOLAM 0.5 MG PO TABS
0.5000 mg | ORAL_TABLET | Freq: Once | ORAL | Status: AC
  Administered 2020-11-05: 0.5 mg via ORAL
  Filled 2020-11-05: qty 1

## 2020-11-05 MED ORDER — ONDANSETRON HCL 4 MG/2ML IJ SOLN
4.0000 mg | Freq: Four times a day (QID) | INTRAMUSCULAR | Status: DC | PRN
Start: 2020-11-05 — End: 2020-11-05
  Administered 2020-11-05: 4 mg via INTRAVENOUS
  Filled 2020-11-05: qty 2

## 2020-11-05 MED ORDER — SODIUM CHLORIDE 0.9 % IV SOLN: Freq: Once | INTRAVENOUS | Status: DC

## 2020-11-05 MED ORDER — POTASSIUM CHLORIDE CRYS ER 20 MEQ PO TBCR
40.0000 meq | EXTENDED_RELEASE_TABLET | Freq: Once | ORAL | Status: AC
  Administered 2020-11-05: 40 meq via ORAL
  Filled 2020-11-05: qty 2

## 2020-11-05 MED ORDER — POTASSIUM CHLORIDE IN NACL 40-0.9 MEQ/L-% IV SOLN
INTRAVENOUS | Status: DC
  Filled 2020-11-05 (×4): qty 1000

## 2020-11-05 MED ORDER — MORPHINE SULFATE (PF) 2 MG/ML IV SOLN
2.0000 mg | INTRAVENOUS | Status: DC | PRN
  Administered 2020-11-05: 2 mg via INTRAVENOUS
  Filled 2020-11-05 (×2): qty 1

## 2020-11-05 MED ORDER — GABAPENTIN 300 MG PO CAPS
300.0000 mg | ORAL_CAPSULE | Freq: Two times a day (BID) | ORAL | Status: DC
  Filled 2020-11-05: qty 1

## 2020-11-05 MED ORDER — ALBUTEROL SULFATE HFA 108 (90 BASE) MCG/ACT IN AERS: 1.0000 | INHALATION_SPRAY | Freq: Four times a day (QID) | RESPIRATORY_TRACT | Status: DC | PRN

## 2020-11-05 MED ORDER — TOPIRAMATE 25 MG PO TABS: 100.0000 mg | ORAL_TABLET | Freq: Every day | ORAL | Status: DC

## 2020-11-05 MED ORDER — ACETAMINOPHEN 325 MG PO TABS
650.0000 mg | ORAL_TABLET | Freq: Four times a day (QID) | ORAL | Status: DC | PRN
  Administered 2020-11-05: 650 mg via ORAL
  Filled 2020-11-05: qty 2

## 2020-11-05 NOTE — Discharge Summary (Signed)
Physician Discharge Summary  Julia [name] [id number] DOB: 18-Jun-1960 DOA: 11/04/2020  PCP: Redmond School, MD  Admit date: 11/04/2020  Discharge date: 11/05/2020  Admitted From:Home  Disposition:  Home  Recommendations for Outpatient Follow-up:  Follow up with PCP in 1-2 weeks Please obtain BMP/CBC in one week Follow-up with Dr. Merlene Laughter with neurology outpatient in 4 weeks Continue home medications as prior Use Zofran as needed for nausea or vomiting  Home Health: None  Equipment/Devices: None  Discharge Condition:Stable  CODE STATUS: Full  Diet recommendation: Heart Healthy  Brief/Interim Summary:  Julia [name] is a 60 y.o. female with medical history significant for hypertension, hyperlipidemia, anxiety, chronic pain who presents to the emergency department via EMS due to a witnessed seizure-like activity.  Patient complained of nausea, vomiting and lower abdominal pain which had started the day prior to admission.  CT of the abdomen and pelvis with no significant findings noted.  She was noted to be hypokalemic and this has been repleted during the stay and will require close follow-up.  EEG with no seizure activity noted.  She is overall in stable condition for discharge and more than likely had a syncopal episode as a result of her dehydration and hypokalemia.  No other acute events noted throughout the course of this admission.  Discharge Diagnoses:  Active Problems:   Seizure [facility])   Essential hypertension   Abdominal pain   Nausea & vomiting   Hypokalemia   Lactic acidosis   Leukocytosis   Hyperglycemia   Elevated AST (SGOT)   Hyperlipidemia   Anxiety  Principal discharge diagnosis: Persistent nausea and vomiting with abdominal pain secondary to gastroenteritis with related hypokalemia.  Likely syncopal episode as a result of the above.  Discharge Instructions  Discharge Instructions     Diet - low sodium heart healthy   Complete by: As  directed    Increase activity slowly   Complete by: As directed       Allergies as of 11/05/2020       Reactions   Naprosyn [naproxen] Nausea Only   Penicillins Nausea And Vomiting, Other (See Comments)   Has patient had a PCN reaction causing immediate rash, facial/tongue/throat swelling, SOB or lightheadedness with hypotension: No Has patient had a PCN reaction causing severe rash involving mucus membranes or skin necrosis: No Has patient had a PCN reaction that required hospitalization Yes Has patient had a PCN reaction occurring within the last 10 years: No If all of the above answers are "NO", then may proceed with Cephalosporin use.   Promethazine Hcl Nausea Only        Medication List     STOP taking these medications    loperamide 2 MG capsule Commonly known as: IMODIUM   PARoxetine 30 MG tablet Commonly known as: PAXIL   propranolol 40 MG tablet Commonly known as: INDERAL       TAKE these medications    acetaminophen 325 MG tablet Commonly known as: TYLENOL Take 650 mg by mouth every 6 (six) hours as needed for moderate pain or headache.   albuterol 108 (90 Base) MCG/ACT inhaler Commonly known as: VENTOLIN HFA   ALPRAZolam 1 MG tablet Commonly known as: XANAX Take 1 mg by mouth 3 (three) times daily as needed for anxiety.   amLODipine 10 MG tablet Commonly known as: NORVASC Take 10 mg by mouth daily.   cyclobenzaprine 10 MG tablet Commonly known as: FLEXERIL Take 10 mg by mouth 2 (two) times daily as needed for  muscle spasms.   ezetimibe 10 MG tablet Commonly known as: ZETIA Take 10 mg by mouth daily.   fluticasone 50 MCG/ACT nasal spray Commonly known as: FLONASE Place 1 spray into both nostrils daily for 14 days.   gabapentin 300 MG capsule Commonly known as: NEURONTIN Take 300 mg by mouth 2 (two) times daily.   hydrochlorothiazide 12.5 MG capsule Commonly known as: MICROZIDE Take 12.5 mg by mouth daily.   ibuprofen 200 MG  tablet Commonly known as: ADVIL Take 200-400 mg by mouth every 6 (six) hours as needed for headache or moderate pain.   losartan 50 MG tablet Commonly known as: COZAAR Take 50 mg by mouth daily.   ondansetron 4 MG disintegrating tablet Commonly known as: Zofran ODT Take 1 tablet (4 mg total) by mouth every 8 (eight) hours as needed for nausea or vomiting.   ondansetron 4 MG tablet Commonly known as: ZOFRAN Take 1 tablet (4 mg total) by mouth every 6 (six) hours.   oxyCODONE-acetaminophen 5-325 MG tablet Commonly known as: PERCOCET/ROXICET Take 1 tablet by mouth 2 (two) times daily as needed for pain.   potassium chloride SA 20 MEQ tablet Commonly known as: KLOR-CON Take 20 mEq by mouth daily.   pravastatin 40 MG tablet Commonly known as: PRAVACHOL Take 40 mg by mouth daily.   tiZANidine 4 MG tablet Commonly known as: ZANAFLEX Take 4 mg by mouth 3 (three) times daily.   topiramate 100 MG tablet Commonly known as: TOPAMAX Take 100 mg by mouth at bedtime.        Follow-up Information     Redmond School, MD. Schedule an appointment as soon as possible for a visit in 1 week(s).   Specialty: Internal Medicine Contact information: [address] [place] [id number] [phone]         Julia Odor, MD. Schedule an appointment as soon as possible for a visit in 4 week(s).   Specialty: Neurology Contact information: [address] [place] [address] [phone]                Allergies  Allergen Reactions   Naprosyn [Naproxen] Nausea Only   Penicillins Nausea And Vomiting and Other (See Comments)    Has patient had a PCN reaction causing immediate rash, facial/tongue/throat swelling, SOB or lightheadedness with hypotension: No Has patient had a PCN reaction causing severe rash involving mucus membranes or skin necrosis: No Has patient had a PCN reaction that required hospitalization Yes Has patient had a PCN reaction occurring within the last 10  years: No If all of the above answers are "NO", then may proceed with Cephalosporin use.    Promethazine Hcl Nausea Only    Consultations: None   Procedures/Studies: CT Head Wo Contrast  Result Date: 11/04/2020 CLINICAL DATA:  Abdominal pain.  Seizure. EXAM: CT HEAD WITHOUT CONTRAST TECHNIQUE: Contiguous axial images were obtained from the base of the skull through the vertex without intravenous contrast. COMPARISON:  None. FINDINGS: Brain: There is no hemorrhage or extra-axial collection. Calcified right convexity meningioma is unchanged. The size and configuration of the ventricles and extra-axial CSF spaces are normal. The brain parenchyma is normal, without acute or chronic infarction. Vascular: No abnormal hyperdensity of the major intracranial arteries or dural venous sinuses. No intracranial atherosclerosis. Skull: The visualized skull base, calvarium and extracranial soft tissues are normal. Sinuses/Orbits: No fluid levels or advanced mucosal thickening of the visualized paranasal sinuses. No mastoid or middle ear effusion. The orbits are normal. IMPRESSION: 1. No acute intracranial  abnormality. 2. Unchanged appearance of calcified right convexity meningioma. Electronically Signed   By: Julia [name] M.D.   On: 11/04/2020 21:44   CT Abdomen Pelvis W Contrast  Result Date: 11/04/2020 CLINICAL DATA:  Epigastric pain EXAM: CT ABDOMEN AND PELVIS WITH CONTRAST TECHNIQUE: Multidetector CT imaging of the abdomen and pelvis was performed using the standard protocol following bolus administration of intravenous contrast. CONTRAST:  94m OMNIPAQUE IOHEXOL 350 MG/ML SOLN COMPARISON:  None. FINDINGS: LOWER CHEST: Normal. HEPATOBILIARY: Normal hepatic contours. No intra- or extrahepatic biliary dilatation. The gallbladder is normal. PANCREAS: Normal pancreas. No ductal dilatation or peripancreatic fluid collection. SPLEEN: Multiple nodular low-density foci resolve on delayed images. This is likely  normal. ADRENALS/URINARY TRACT: The adrenal glands are normal. No hydronephrosis, nephroureterolithiasis or solid renal mass. The urinary bladder is normal for degree of distention STOMACH/BOWEL: There is no hiatal hernia. Normal duodenal course and caliber. No small bowel dilatation or inflammation. No focal colonic abnormality. Normal appendix. VASCULAR/LYMPHATIC: Normal course and caliber of the major abdominal vessels. No abdominal or pelvic lymphadenopathy. REPRODUCTIVE: Normal uterus. No adnexal mass. MUSCULOSKELETAL. No bony spinal canal stenosis or focal osseous abnormality. OTHER: None. IMPRESSION: 1. No acute abnormality of the abdomen or pelvis. Electronically Signed   By: KUlyses JarredM.D.   On: 11/04/2020 21:42   EEG adult  Result Date: 11/05/2020 YLora Havens MD     11/05/2020  3:10 PM Patient Name: DJERLINE LUDWIGMRN: [id number] Attending: PLora HavensReferring Physician/Provider: Dr ABernadette HoitDate: 11/05/2020 Duration: 21.42 mins Patient history: 60yo f with New onset seizures. EEG to evaluate for seizure Level of alertness: Awake AEDs during EEG study: GBP, TPM Technical aspects: This EEG study was done with scalp electrodes positioned according to the 10-20 International system of electrode placement. Electrical activity was acquired at a sampling rate of '500Hz'$  and reviewed with a high frequency filter of '70Hz'$  and a low frequency filter of '1Hz'$ . EEG data were recorded continuously and digitally stored. Description: The posterior dominant rhythm consists of 9-10 Hz activity of moderate voltage (25-35 uV) seen predominantly in posterior head regions, symmetric and reactive to eye opening and eye closing. Physiologic photic driving was seen during photic stimulation.  Hyperventilation was not performed.   IMPRESSION: This study is within normal limits. No seizures or epileptiform discharges were seen throughout the recording. PLora [name]    Discharge  Exam: Vitals:   11/05/20 1430 11/05/20 1500  BP: 136/69 (!) 144/73  Pulse: 69 71  Resp: 11   Temp:    SpO2: 100% 100%   Vitals:   11/05/20 1345 11/05/20 1400 11/05/20 1430 11/05/20 1500  BP: 139/78 (!) 144/80 136/69 (!) 144/73  Pulse: 71 65 69 71  Resp: 16  11   Temp:      TempSrc:      SpO2: 100% 100% 100% 100%  Weight:      Height:        General: Pt is alert, awake, not in acute distress Cardiovascular: RRR, S1/S2 +, no rubs, no gallops Respiratory: CTA bilaterally, no wheezing, no rhonchi Abdominal: Soft, NT, ND, bowel sounds + Extremities: no edema, no cyanosis    The results of significant diagnostics from this hospitalization (including imaging, microbiology, ancillary and laboratory) are listed below for reference.     Microbiology: Recent Results (from the past 240 hour(s))  Resp Panel by RT-PCR (Flu A&B, Covid) Nasopharyngeal Swab     Status: None   Collection Time: 11/04/20 10:31  PM   Specimen: Nasopharyngeal Swab; Nasopharyngeal(NP) swabs in vial transport medium  Result Value Ref Range Status   SARS Coronavirus 2 by RT PCR NEGATIVE NEGATIVE Final    Comment: (NOTE) SARS-CoV-2 target nucleic acids are NOT DETECTED.  The SARS-CoV-2 RNA is generally detectable in upper respiratory specimens during the acute phase of infection. The lowest concentration of SARS-CoV-2 viral copies this assay can detect is 138 copies/mL. A negative result does not preclude SARS-Cov-2 infection and should not be used as the sole basis for treatment or other patient management decisions. A negative result may occur with  improper specimen collection/handling, submission of specimen other than nasopharyngeal swab, presence of viral mutation(s) within the areas targeted by this assay, and inadequate number of viral copies(<138 copies/mL). A negative result must be combined with clinical observations, patient history, and epidemiological information. The expected result is  Negative.  Fact Sheet for Patients:  EntrepreneurPulse.com.au  Fact Sheet for Healthcare Providers:  IncredibleEmployment.be  This test is no t yet approved or cleared by the [place] FDA and  has been authorized for detection and/or diagnosis of SARS-CoV-2 by FDA under an Emergency Use Authorization (EUA). This EUA will remain  in effect (meaning this test can be used) for the duration of the COVID-19 declaration under Section 564(b)(1) of the Act, 21 U.S.C.section 360bbb-3(b)(1), unless the authorization is terminated  or revoked sooner.       Influenza A by PCR NEGATIVE NEGATIVE Final   Influenza B by PCR NEGATIVE NEGATIVE Final    Comment: (NOTE) The Xpert Xpress SARS-CoV-2/FLU/RSV plus assay is intended as an aid in the diagnosis of influenza from Nasopharyngeal swab specimens and should not be used as a sole basis for treatment. Nasal washings and aspirates are unacceptable for Xpert Xpress SARS-CoV-2/FLU/RSV testing.  Fact Sheet for Patients: EntrepreneurPulse.com.au  Fact Sheet for Healthcare Providers: IncredibleEmployment.be  This test is not yet approved or cleared by the [place] FDA and has been authorized for detection and/or diagnosis of SARS-CoV-2 by FDA under an Emergency Use Authorization (EUA). This EUA will remain in effect (meaning this test can be used) for the duration of the COVID-19 declaration under Section 564(b)(1) of the Act, 21 U.S.C. section 360bbb-3(b)(1), unless the authorization is terminated or revoked.  Performed at [facility], [address], [place], [place] [address]      Labs: BNP (last 3 results) No results for input(s): BNP in the last 8760 hours. Basic Metabolic Panel: Recent Labs  Lab 11/04/20 2003 11/05/20 0419 11/05/20 1204  NA 139 144 141  K 2.4* 2.8* 3.1*  CL 105 110 111  CO2 21* 26 25  GLUCOSE 175* 96 100*  BUN '16 11 9   '$ CREATININE 0.94 0.80 0.79  CALCIUM 10.0 9.4 9.2  MG  --  2.2  --   PHOS  --  1.6*  --    Liver Function Tests: Recent Labs  Lab 11/04/20 2003 11/05/20 0419  AST 50* 49*  ALT 27 26  ALKPHOS 87 75  BILITOT 3.3* 2.9*  PROT 7.4 6.3*  ALBUMIN 4.4 3.7   Recent Labs  Lab 11/04/20 2003  LIPASE 23   No results for input(s): AMMONIA in the last 168 hours. CBC: Recent Labs  Lab 11/04/20 2003 11/05/20 0419  WBC 10.9* 11.8*  NEUTROABS 7.7  --   HGB 14.6 13.0  HCT 41.7 38.0  MCV 86.7 89.2  PLT 245 200   Cardiac Enzymes: No results for input(s): CKTOTAL, CKMB, CKMBINDEX, TROPONINI in  the last 168 hours. BNP: Invalid input(s): POCBNP CBG: No results for input(s): GLUCAP in the last 168 hours. D-Dimer No results for input(s): DDIMER in the last 72 hours. Hgb A1c No results for input(s): HGBA1C in the last 72 hours. Lipid Profile No results for input(s): CHOL, HDL, LDLCALC, TRIG, CHOLHDL, LDLDIRECT in the last 72 hours. Thyroid function studies No results for input(s): TSH, T4TOTAL, T3FREE, THYROIDAB in the last 72 hours.  Invalid input(s): FREET3 Anemia work up No results for input(s): VITAMINB12, FOLATE, FERRITIN, TIBC, IRON, RETICCTPCT in the last 72 hours. Urinalysis    Component Value Date/Time   COLORURINE YELLOW 11/05/2020 0750   APPEARANCEUR HAZY (A) 11/05/2020 0750   LABSPEC 1.027 11/05/2020 0750   PHURINE 8.0 11/05/2020 0750   GLUCOSEU NEGATIVE 11/05/2020 0750   HGBUR SMALL (A) 11/05/2020 0750   BILIRUBINUR NEGATIVE 11/05/2020 0750   KETONESUR 5 (A) 11/05/2020 0750   PROTEINUR NEGATIVE 11/05/2020 0750   UROBILINOGEN 1.0 12/27/2008 2206   NITRITE NEGATIVE 11/05/2020 0750   LEUKOCYTESUR NEGATIVE 11/05/2020 0750   Sepsis Labs Invalid input(s): PROCALCITONIN,  WBC,  LACTICIDVEN Microbiology Recent Results (from the past 240 hour(s))  Resp Panel by RT-PCR (Flu A&B, Covid) Nasopharyngeal Swab     Status: None   Collection Time: 11/04/20 10:31 PM    Specimen: Nasopharyngeal Swab; Nasopharyngeal(NP) swabs in vial transport medium  Result Value Ref Range Status   SARS Coronavirus 2 by RT PCR NEGATIVE NEGATIVE Final    Comment: (NOTE) SARS-CoV-2 target nucleic acids are NOT DETECTED.  The SARS-CoV-2 RNA is generally detectable in upper respiratory specimens during the acute phase of infection. The lowest concentration of SARS-CoV-2 viral copies this assay can detect is 138 copies/mL. A negative result does not preclude SARS-Cov-2 infection and should not be used as the sole basis for treatment or other patient management decisions. A negative result may occur with  improper specimen collection/handling, submission of specimen other than nasopharyngeal swab, presence of viral mutation(s) within the areas targeted by this assay, and inadequate number of viral copies(<138 copies/mL). A negative result must be combined with clinical observations, patient history, and epidemiological information. The expected result is Negative.  Fact Sheet for Patients:  EntrepreneurPulse.com.au  Fact Sheet for Healthcare Providers:  IncredibleEmployment.be  This test is no t yet approved or cleared by the [place] FDA and  has been authorized for detection and/or diagnosis of SARS-CoV-2 by FDA under an Emergency Use Authorization (EUA). This EUA will remain  in effect (meaning this test can be used) for the duration of the COVID-19 declaration under Section 564(b)(1) of the Act, 21 U.S.C.section 360bbb-3(b)(1), unless the authorization is terminated  or revoked sooner.       Influenza A by PCR NEGATIVE NEGATIVE Final   Influenza B by PCR NEGATIVE NEGATIVE Final    Comment: (NOTE) The Xpert Xpress SARS-CoV-2/FLU/RSV plus assay is intended as an aid in the diagnosis of influenza from Nasopharyngeal swab specimens and should not be used as a sole basis for treatment. Nasal washings and aspirates are  unacceptable for Xpert Xpress SARS-CoV-2/FLU/RSV testing.  Fact Sheet for Patients: EntrepreneurPulse.com.au  Fact Sheet for Healthcare Providers: IncredibleEmployment.be  This test is not yet approved or cleared by the [place] FDA and has been authorized for detection and/or diagnosis of SARS-CoV-2 by FDA under an Emergency Use Authorization (EUA). This EUA will remain in effect (meaning this test can be used) for the duration of the COVID-19 declaration under Section 564(b)(1) of the Act, 21 U.S.C.  section 360bbb-3(b)(1), unless the authorization is terminated or revoked.  Performed at [facility], [address] [place], [place] [address]      Time coordinating discharge: 35 minutes  SIGNED:   Rodena Goldmann, DO [facility] 11/05/2020, 3:24 PM  If 7PM-7AM, please contact night-coverage [email or web address]

## 2020-11-05 NOTE — Progress Notes (Signed)
EEG Completed; Results Pending  

## 2020-11-05 NOTE — H&P (Signed)
History and Physical  Julia [name] [id number] DOB: 1960/11/14 DOA: 11/04/2020  Referring physician: Hayden Rasmussen, MD PCP: Redmond School, MD  Patient coming from: Home  Chief Complaint: Seizures  HPI: Julia [name] is a 60 y.o. female with medical history significant for hypertension, hyperlipidemia, anxiety, chronic pain who presents to the emergency department via EMS due to a witnessed seizure-like activity.  Patient complained of nausea, vomiting and lower abdominal pain which started yesterday, she endorsed 5-6 episodes of non-bloody vomiting daily, vomitus was yellowish in color.  Abdominal pain was described as sharp and crampy and was moderate in intensity.  Patient was reported to have witnessed seizures, but patient states that she was not aware of any seizure-like activity.  Apparently, patient's sister witnessed patient seizing and EMS was activated, patient states that she was confused after the reported seizure-like activity, but denies biting of lips, tongue, urinary/bowel incontinence.  ED Course: In the emergency department, she was intermittently tachypneic and was initially tachycardic.  Work-up in the ED showed leukocytosis, hypokalemia, hyperglycemia, elevated AST, lactic acid 5.1 > 2.2.  Lipase 23.  Influenza A, B, SARS coronavirus 2 was negative. 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  IV hydration was provided, magnesium was given and potassium was replenished.  Hospitalist was asked to admit patient for further evaluation and management.  Review of Systems: Constitutional: Negative for chills and fever.  HENT: Negative for ear pain and sore throat.   Eyes: Negative for pain and visual disturbance.  Respiratory: Negative for cough, chest  tightness and shortness of breath.   Cardiovascular: Negative for chest pain and palpitations.  Gastrointestinal: Positive for abdominal pain, nausea and vomiting.  Endocrine: Negative for polyphagia and polyuria.  Genitourinary: Negative for decreased urine volume, dysuria, enuresis Musculoskeletal: Negative for arthralgias and back pain.  Skin: Negative for color change and rash.  Allergic/Immunologic: Negative for immunocompromised state.  Neurological: Positive for tremors and seizure.  Negative for syncope, speech difficulty Hematological: Does not bruise/bleed easily.  All other systems reviewed and are negative   Past Medical History:  Diagnosis Date   Anxiety    Brain tumor (benign) (HCC)    Chronic pain    Elevated cholesterol    Headache(784.0)    Hypertension    Numerous moles    Trichimoniasis    Past Surgical History:  Procedure Laterality Date   EXCISION OF SKIN TAG N/A 09/22/2017   Procedure: EXCISION OF SKIN TAGS ON FACE AND NECK (Procedure #2);  Surgeon: Jonnie Kind, MD;  Location: [facility];  Service: Gynecology;  Laterality: N/A;   HYSTEROSCOPY WITH D & C N/A 09/22/2017   Procedure: DILATATION AND CURETTAGE /HYSTEROSCOPY; EXCISION OF VULVAR AND RIGHT AND LEFT THIGH SKIN TAGS (Procedure #1);  Surgeon: Jonnie Kind, MD;  Location: [facility];  Service: Gynecology;  Laterality: N/A;   POLYPECTOMY N/A 09/22/2017   Procedure: REMOVAL OF ENDOMETRIAL POLYP (Procedure #1);  Surgeon: Jonnie Kind, MD;  Location: [facility];  Service: Gynecology;  Laterality: N/A;   TUBAL LIGATION      Social History:  reports that she has never smoked. She has never used smokeless tobacco. She reports that she does not drink alcohol and does not use drugs.   Allergies  Allergen Reactions   Naprosyn [Naproxen] Nausea Only  Penicillins Nausea And Vomiting and Other (See Comments)    Has patient had a PCN reaction causing immediate rash, facial/tongue/throat swelling, SOB or  lightheadedness with hypotension: No Has patient had a PCN reaction causing severe rash involving mucus membranes or skin necrosis: No Has patient had a PCN reaction that required hospitalization Yes Has patient had a PCN reaction occurring within the last 10 years: No If all of the above answers are "NO", then may proceed with Cephalosporin use.    Promethazine Hcl Nausea Only    Family History  Problem Relation Age of Onset   Stroke Mother    Heart failure Mother    Cancer Father        lung   Diabetes Brother      Prior to Admission medications   Medication Sig Start Date End Date Taking? Authorizing Provider  acetaminophen (TYLENOL) 325 MG tablet Take 650 mg by mouth every 6 (six) hours as needed for moderate pain or headache.    [provider]  albuterol (PROVENTIL HFA;VENTOLIN HFA) 108 (90 Base) MCG/ACT inhaler  09/18/17   [provider]  ALPRAZolam Duanne Moron) 1 MG tablet Take 1 mg by mouth 3 (three) times daily as needed for anxiety.     [provider]  amLODipine (NORVASC) 10 MG tablet Take 10 mg by mouth daily.  07/10/17   [provider]  cyclobenzaprine (FLEXERIL) 10 MG tablet Take 10 mg by mouth 2 (two) times daily as needed for muscle spasms. 07/26/20   [provider]  ezetimibe (ZETIA) 10 MG tablet Take 10 mg by mouth daily.  08/15/16   [provider]  fluticasone (FLONASE) 50 MCG/ACT nasal spray Place 1 spray into both nostrils daily for 14 days. 10/26/19 11/09/19  Avegno, Darrelyn Hillock, FNP  gabapentin (NEURONTIN) 300 MG capsule Take 300 mg by mouth 2 (two) times daily. 07/26/20   [provider]  hydrochlorothiazide (MICROZIDE) 12.5 MG capsule Take 12.5 mg by mouth daily. 05/28/20   [provider]  ibuprofen (ADVIL,MOTRIN) 200 MG tablet Take 200-400 mg by mouth every 6 (six) hours as needed for headache or moderate pain.    [provider]  loperamide (IMODIUM) 2 MG capsule Take 1 capsule (2 mg total)  by mouth 4 (four) times daily as needed for diarrhea or loose stools. 11/03/20   Truddie Hidden, MD  losartan (COZAAR) 50 MG tablet Take 50 mg by mouth daily.  09/01/13   [provider]  ondansetron (ZOFRAN ODT) 4 MG disintegrating tablet Take 1 tablet (4 mg total) by mouth every 8 (eight) hours as needed for nausea or vomiting. 11/03/20   Truddie Hidden, MD  ondansetron (ZOFRAN) 4 MG tablet Take 1 tablet (4 mg total) by mouth every 6 (six) hours. 03/16/20   Faustino Congress, NP  oxyCODONE-acetaminophen (PERCOCET/ROXICET) 5-325 MG tablet Take 1 tablet by mouth 2 (two) times daily as needed for pain. 07/26/20   [provider]  PARoxetine (PAXIL) 30 MG tablet Take 30 mg by mouth at bedtime.  05/22/12   [provider]  pravastatin (PRAVACHOL) 40 MG tablet Take 40 mg by mouth daily. 05/20/12   [provider]  propranolol (INDERAL) 40 MG tablet Take 40 mg by mouth 2 (two) times daily. 07/26/20   [provider]  tiZANidine (ZANAFLEX) 4 MG tablet Take 4 mg by mouth 3 (three) times daily. 05/28/20   [provider]  topiramate (TOPAMAX) 100 MG tablet Take 100 mg by mouth at bedtime.  07/26/20   [provider]    Physical Exam: BP (!) 145/76   Pulse 81   Temp 98.3 F (36.8 C) (Oral)   Resp 14   Ht 5' (1.524 m)   Wt 64.9 kg   LMP 09/06/2012   SpO2 100%   BMI 27.93 kg/m   General: 60 y.o. year-old female well developed well nourished in no acute distress.  Alert and oriented x3. HEENT: NCAT, EOMI Neck: Supple, trachea medial Cardiovascular: Regular rate and rhythm with no rubs or gallops.  No thyromegaly or JVD noted.  No lower extremity edema. 2/4 pulses in all 4 extremities. Respiratory: Clear to auscultation with no wheezes or rales. Good inspiratory effort. Abdomen: Soft, nontender nondistended with normal bowel sounds x4 quadrants. Muskuloskeletal: No cyanosis, clubbing or edema noted bilaterally Neuro: Noted tremors  inpatient.  CN II-XII intact, sensation intact Skin: No ulcerative lesions noted or rashes Psychiatry: Mood is appropriate for condition and setting          Labs on Admission:  Basic Metabolic Panel: Recent Labs  Lab 11/04/20 2003  NA 139  K 2.4*  CL 105  CO2 21*  GLUCOSE 175*  BUN 16  CREATININE 0.94  CALCIUM 10.0    Liver Function Tests: Recent Labs  Lab 11/04/20 2003  AST 50*  ALT 27  ALKPHOS 87  BILITOT 3.3*  PROT 7.4  ALBUMIN 4.4    Recent Labs  Lab 11/04/20 2003  LIPASE 23    No results for input(s): AMMONIA in the last 168 hours. CBC: Recent Labs  Lab 11/04/20 2003  WBC 10.9*  NEUTROABS 7.7  HGB 14.6  HCT 41.7  MCV 86.7  PLT 245    Cardiac Enzymes: No results for input(s): CKTOTAL, CKMB, CKMBINDEX, TROPONINI in the last 168 hours.  BNP (last 3 results) No results for input(s): BNP in the last 8760 hours.  ProBNP (last 3 results) No results for input(s): PROBNP in the last 8760 hours.  CBG: No results for input(s): GLUCAP in the last 168 hours.  Radiological Exams on Admission: CT Head Wo Contrast  Result Date: 11/04/2020 CLINICAL DATA:  Abdominal pain.  Seizure. EXAM: CT HEAD WITHOUT CONTRAST TECHNIQUE: Contiguous axial images were obtained from the base of the skull through the vertex without intravenous contrast. COMPARISON:  None. FINDINGS: Brain: There is no hemorrhage or extra-axial collection. Calcified right convexity meningioma is unchanged. The size and configuration of the ventricles and extra-axial CSF spaces are normal. The brain parenchyma is normal, without acute or chronic infarction. Vascular: No abnormal hyperdensity of the major intracranial arteries or dural venous sinuses. No intracranial atherosclerosis. Skull: The visualized skull base, calvarium and extracranial soft tissues are normal. Sinuses/Orbits: No fluid levels or advanced mucosal thickening of the visualized paranasal sinuses. No mastoid or middle ear effusion.  The orbits are normal. IMPRESSION: 1. No acute intracranial abnormality. 2. Unchanged appearance of calcified right convexity meningioma. Electronically Signed   By: Ulyses Jarred M.D.   On: 11/04/2020 21:44   CT Abdomen Pelvis W Contrast  Result Date: 11/04/2020 CLINICAL DATA:  Epigastric pain EXAM: CT ABDOMEN AND PELVIS WITH CONTRAST TECHNIQUE: Multidetector CT imaging of the abdomen and pelvis was performed using the standard protocol following bolus administration of intravenous contrast. CONTRAST:  39m OMNIPAQUE IOHEXOL 350 MG/ML SOLN COMPARISON:  None. FINDINGS: LOWER CHEST: Normal. HEPATOBILIARY: Normal hepatic contours. No intra- or extrahepatic biliary dilatation. The gallbladder is normal. PANCREAS: Normal pancreas. No ductal dilatation or peripancreatic fluid collection. SPLEEN: Multiple nodular  low-density foci resolve on delayed images. This is likely normal. ADRENALS/URINARY TRACT: The adrenal glands are normal. No hydronephrosis, nephroureterolithiasis or solid renal mass. The urinary bladder is normal for degree of distention STOMACH/BOWEL: There is no hiatal hernia. Normal duodenal course and caliber. No small bowel dilatation or inflammation. No focal colonic abnormality. Normal appendix. VASCULAR/LYMPHATIC: Normal course and caliber of the major abdominal vessels. No abdominal or pelvic lymphadenopathy. REPRODUCTIVE: Normal uterus. No adnexal mass. MUSCULOSKELETAL. No bony spinal canal stenosis or focal osseous abnormality. OTHER: None. IMPRESSION: 1. No acute abnormality of the abdomen or pelvis. Electronically Signed   By: Ulyses Jarred M.D.   On: 11/04/2020 21:42    EKG: I independently viewed the EKG done and my findings are as followed: Normal sinus rhythm at a rate of 100 bpm  Assessment/Plan Present on Admission:  Essential hypertension  Active Problems:   Seizure ([place])   Essential hypertension   Abdominal pain   Nausea & vomiting   Hypokalemia   Lactic acidosis    Leukocytosis   Hyperglycemia   Elevated AST (SGOT)   Hyperlipidemia   Anxiety  Nausea, vomiting, abdominal pain CT abdomen and pelvis with contrast showed no acute abnormality of the abdominal pelvis Continue IV NS at 75 mLs/Hr Continue IV morphine 2 mg q.4h p.r.n. for moderate to severe pain Continue IV Zofran p.r.n. Continue clear liquid diet with plan to advanced diet as tolerated  Hypokalemia K+ is 2.4 K+ will be replenished Please monitor for AM K+ for further replenishmemnt  New onset seizures Patient had witnessed seizures at home She has not had any seizure episode since arrival to the ED EEG will be done in the morning Continue fall precaution, seizure precaution, aspiration precaution and neurochecks Continue gabapentin and Topamax Consider neurology consult based on EEG findings  Lactic acidosis possibly due to seizure effect Lactic acid 5.1 > 2.2; continue to trend lactic acid  Leukocytosis possibly reactive WBC 10.9, no obvious acute infectious process at this time Continue to monitor WBC with morning labs  Hyperglycemia with no known history of T2DM CBG 175, continue to monitor blood glucose level with morning labs Consider checking hemoglobin A1c if blood glucose continues to stay elevated  Elevated AST AST 50, continue to monitor liver enzymes  Essential hypertension Continue amlodipine and propranolol  Hyperlipidemia Statin temporarily held due to elevated AST  Anxiety Continue home Xanax.   DVT prophylaxis:  Lovenox  Code Status: Full code  Family Communication: None at bedside  Disposition Plan:  Patient is from:                        home Anticipated DC to:                   SNF or family members home Anticipated DC date:               2-3 days Anticipated DC barriers:          Patient requires inpatient management due to new onset seizure requiring further work-up and hypokalemia  Consults called: None  Admission status:  Observation    Bernadette Hoit MD [facility]  11/05/2020, 2:24 AM

## 2020-11-05 NOTE — Procedures (Addendum)
Patient Name: KAMARYN ANTES  MRN: [id number]  Epilepsy Attending: Lora Havens  Referring Physician/Provider: Dr Bernadette Hoit Date: 11/05/2020 Duration: 21.42 mins  Patient history: 60yo f with New onset seizures. EEG to evaluate for seizure  Level of alertness: Awake  AEDs during EEG study: GBP, TPM  Technical aspects: This EEG study was done with scalp electrodes positioned according to the 10-20 International system of electrode placement. Electrical activity was acquired at a sampling rate of '500Hz'$  and reviewed with a high frequency filter of '70Hz'$  and a low frequency filter of '1Hz'$ . EEG data were recorded continuously and digitally stored.   Description: The posterior dominant rhythm consists of 9-10 Hz activity of moderate voltage (25-35 uV) seen predominantly in posterior head regions, symmetric and reactive to eye opening and eye closing. Physiologic photic driving was seen during photic stimulation.  Hyperventilation was not performed.     IMPRESSION: This study is within normal limits. No seizures or epileptiform discharges were seen throughout the recording.  Rayshard Schirtzinger Barbra Sarks

## 2020-11-05 NOTE — ED Notes (Signed)
Pt reports taht she has h/a.  Messaged md for tylenol order.  Order received.  Pt given tylenol for h/a 5/10

## 2020-11-05 NOTE — ED Notes (Signed)
Patient ambulatory to bedside commode and assisted into chair. Daughter at bedside; educated on use of call bell before returning to bed.

## 2020-11-07 DIAGNOSIS — G894 Chronic pain syndrome: Secondary | ICD-10-CM | POA: Diagnosis not present

## 2020-11-07 DIAGNOSIS — Z6827 Body mass index (BMI) 27.0-27.9, adult: Secondary | ICD-10-CM | POA: Diagnosis not present

## 2020-11-07 DIAGNOSIS — Z79899 Other long term (current) drug therapy: Secondary | ICD-10-CM | POA: Diagnosis not present

## 2020-11-07 DIAGNOSIS — R569 Unspecified convulsions: Secondary | ICD-10-CM | POA: Diagnosis not present

## 2020-11-07 DIAGNOSIS — E663 Overweight: Secondary | ICD-10-CM | POA: Diagnosis not present

## 2020-11-07 DIAGNOSIS — E876 Hypokalemia: Secondary | ICD-10-CM | POA: Diagnosis not present

## 2020-11-15 DIAGNOSIS — E782 Mixed hyperlipidemia: Secondary | ICD-10-CM | POA: Diagnosis not present

## 2020-11-15 DIAGNOSIS — Q8500 Neurofibromatosis, unspecified: Secondary | ICD-10-CM | POA: Diagnosis not present

## 2020-11-18 ENCOUNTER — Emergency Department (HOSPITAL_COMMUNITY): Payer: Medicare HMO

## 2020-11-18 ENCOUNTER — Other Ambulatory Visit: Payer: Self-pay

## 2020-11-18 ENCOUNTER — Emergency Department (HOSPITAL_COMMUNITY)
Admission: EM | Admit: 2020-11-18 | Discharge: 2020-11-18 | Disposition: A | Discharge status: Home / Self Care | Payer: Medicare HMO | Attending: Emergency Medicine | Admitting: Emergency Medicine

## 2020-11-18 ENCOUNTER — Encounter (HOSPITAL_COMMUNITY): Payer: Self-pay | Admitting: Emergency Medicine

## 2020-11-18 DIAGNOSIS — E876 Hypokalemia: Secondary | ICD-10-CM | POA: Insufficient documentation

## 2020-11-18 DIAGNOSIS — I10 Essential (primary) hypertension: Secondary | ICD-10-CM | POA: Insufficient documentation

## 2020-11-18 DIAGNOSIS — R1030 Lower abdominal pain, unspecified: Secondary | ICD-10-CM | POA: Insufficient documentation

## 2020-11-18 DIAGNOSIS — R112 Nausea with vomiting, unspecified: Secondary | ICD-10-CM | POA: Insufficient documentation

## 2020-11-18 DIAGNOSIS — K760 Fatty (change of) liver, not elsewhere classified: Secondary | ICD-10-CM | POA: Diagnosis not present

## 2020-11-18 DIAGNOSIS — R109 Unspecified abdominal pain: Secondary | ICD-10-CM | POA: Diagnosis not present

## 2020-11-18 DIAGNOSIS — Z79899 Other long term (current) drug therapy: Secondary | ICD-10-CM | POA: Insufficient documentation

## 2020-11-18 HISTORY — DX: Unspecified convulsions: R56.9

## 2020-11-18 LAB — LIPASE, BLOOD: Lipase: 29 U/L (ref 11–51)

## 2020-11-18 LAB — CBC
HCT: 39.2 % (ref 36.0–46.0)
Hemoglobin: 13.1 g/dL (ref 12.0–15.0)
MCH: 30.1 pg (ref 26.0–34.0)
MCHC: 33.4 g/dL (ref 30.0–36.0)
MCV: 90.1 fL (ref 80.0–100.0)
Platelets: 265 10*3/uL (ref 150–400)
RBC: 4.35 MIL/uL (ref 3.87–5.11)
RDW: 13.5 % (ref 11.5–15.5)
WBC: 8.5 10*3/uL (ref 4.0–10.5)
nRBC: 0 % (ref 0.0–0.2)

## 2020-11-18 LAB — COMPREHENSIVE METABOLIC PANEL
ALT: 32 U/L (ref 0–44)
AST: 34 U/L (ref 15–41)
Albumin: 3.9 g/dL (ref 3.5–5.0)
Alkaline Phosphatase: 88 U/L (ref 38–126)
Anion gap: 8 (ref 5–15)
BUN: 11 mg/dL (ref 8–23)
CO2: 21 mmol/L — ABNORMAL LOW (ref 22–32)
Calcium: 10.5 mg/dL — ABNORMAL HIGH (ref 8.9–10.3)
Chloride: 109 mmol/L (ref 98–111)
Creatinine, Ser: 1.13 mg/dL — ABNORMAL HIGH (ref 0.44–1.00)
GFR, Estimated: 55 mL/min — ABNORMAL LOW (ref >60)
Glucose, Bld: 103 mg/dL — ABNORMAL HIGH (ref 70–99)
Potassium: 3.4 mmol/L — ABNORMAL LOW (ref 3.5–5.1)
Sodium: 138 mmol/L (ref 135–145)
Total Bilirubin: 1.6 mg/dL — ABNORMAL HIGH (ref 0.3–1.2)
Total Protein: 6.8 g/dL (ref 6.5–8.1)

## 2020-11-18 LAB — URINALYSIS, ROUTINE W REFLEX MICROSCOPIC
Bilirubin Urine: NEGATIVE
Glucose, UA: NEGATIVE mg/dL
Hgb urine dipstick: NEGATIVE
Ketones, ur: NEGATIVE mg/dL
Nitrite: NEGATIVE
Protein, ur: NEGATIVE mg/dL
Specific Gravity, Urine: 1.015 (ref 1.005–1.030)
pH: 6 (ref 5.0–8.0)

## 2020-11-18 LAB — URINALYSIS, MICROSCOPIC (REFLEX)

## 2020-11-18 MED ORDER — DICYCLOMINE HCL 10 MG/ML IM SOLN
20.0000 mg | Freq: Once | INTRAMUSCULAR | Status: AC
  Administered 2020-11-18: 20 mg via INTRAMUSCULAR
  Filled 2020-11-18: qty 2

## 2020-11-18 MED ORDER — ONDANSETRON HCL 4 MG/2ML IJ SOLN
4.0000 mg | Freq: Once | INTRAMUSCULAR | Status: AC
  Administered 2020-11-18: 4 mg via INTRAVENOUS
  Filled 2020-11-18: qty 2

## 2020-11-18 MED ORDER — SODIUM CHLORIDE 0.9 % IV BOLUS
1000.0000 mL | Freq: Once | INTRAVENOUS | Status: AC
  Administered 2020-11-18: 1000 mL via INTRAVENOUS

## 2020-11-18 MED ORDER — IOHEXOL 350 MG/ML SOLN
80.0000 mL | Freq: Once | INTRAVENOUS | Status: AC | PRN
  Administered 2020-11-18: 80 mL via INTRAVENOUS

## 2020-11-18 MED ORDER — OMEPRAZOLE 20 MG PO CPDR: 20.0000 mg | DELAYED_RELEASE_CAPSULE | Freq: Every day | ORAL | 0 refills | Status: DC

## 2020-11-18 MED ORDER — MORPHINE SULFATE (PF) 4 MG/ML IV SOLN
4.0000 mg | Freq: Once | INTRAVENOUS | Status: DC
Start: 2020-11-18 — End: 2020-11-18

## 2020-11-18 NOTE — ED Notes (Signed)
Pt placed on cardiac monitor with BP to set cycle every 30 minutes. Continuous pulse oximeter applied.  

## 2020-11-18 NOTE — Discharge Instructions (Signed)
Lab work and imaging are reassuring.  I have started you on a acid pill please take as prescribed.  I also recommend continuing taking  Bentyl as well as Zofran as needed.  Over the next couple days please continue with a bland diet.   Please follow-up with your PCP for further evaluation.  Come back to the emergency department if you develop chest pain, shortness of breath, severe abdominal pain, uncontrolled nausea, vomiting, diarrhea.

## 2020-11-18 NOTE — ED Provider Notes (Signed)
[facility] EMERGENCY DEPARTMENT Provider Note   CSN: [id number] Arrival date & time: 11/18/20  1753     History Chief Complaint  Patient presents with   Abdominal Pain    Julia [name] is a 60 y.o. female.  HPI  Patient with no significant medical history presents to the emergency department with chief complaint of lower abdominal pain for 1 week's time.  Patient states the pain came on suddenly, states is constant, does not radiate, she has associated nausea and vomiting, she denies hematemesis, coffee-ground emesis, states is worse after she eats food, has been unable to tolerate p.o. for the last day.  She has no associated constipation, diarrhea, denies urinary symptoms.  She has no significant abdominal history, denies history of diverticulitis, bowel obstruction, PUD, she denies alcohol use or recent NSAID use.  She denies alleviating factors.  She does not endorse systemic infection like fevers or chills, chest pain, shortness of breath, general body aches, denies recent sick contacts.  She has no other complaints at this time.  Past Medical History:  Diagnosis Date   Anxiety    Brain tumor (benign) (HCC)    Chronic pain    Elevated cholesterol    Headache(784.0)    Hypertension    Numerous moles    Seizures ([place])    Trichimoniasis     Patient Active Problem List   Diagnosis Date Noted   Abdominal pain 11/05/2020   Nausea & vomiting 11/05/2020   Hypokalemia 11/05/2020   Lactic acidosis 11/05/2020   Leukocytosis 11/05/2020   Hyperglycemia 11/05/2020   Elevated AST (SGOT) 11/05/2020   Hyperlipidemia 11/05/2020   Anxiety 11/05/2020   Seizure ([place]) 11/04/2020   Long-term current use of opiate analgesic 11/04/2020   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   Chronic headache disorder 11/04/2020   Chronic low back pain 11/04/2020   Essential hypertension 11/04/2020   Migraine without aura 11/04/2020   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   Pain  radiating to neck 11/04/2020   Neck pain 04/19/2020   Thickened endometrium 06/17/2017   Screening for colorectal cancer 06/09/2017   Encounter for gynecological examination with Papanicolaou smear of cervix 06/09/2017   PMB (postmenopausal bleeding) 06/09/2017   Drug-induced constipation 09/30/2012   Colon cancer screening 09/30/2012   Trichomoniasis of vagina 08/31/2012   Pelvic relaxation due to cystocele, midline 06/02/2012   Rectocele 06/02/2012    Past Surgical History:  Procedure Laterality Date   EXCISION OF SKIN TAG N/A 09/22/2017   Procedure: EXCISION OF SKIN TAGS ON FACE AND NECK (Procedure #2);  Surgeon: Jonnie Kind, MD;  Location: [facility];  Service: Gynecology;  Laterality: N/A;   HYSTEROSCOPY WITH D & C N/A 09/22/2017   Procedure: DILATATION AND CURETTAGE /HYSTEROSCOPY; EXCISION OF VULVAR AND RIGHT AND LEFT THIGH SKIN TAGS (Procedure #1);  Surgeon: Jonnie Kind, MD;  Location: [facility];  Service: Gynecology;  Laterality: N/A;   POLYPECTOMY N/A 09/22/2017   Procedure: REMOVAL OF ENDOMETRIAL POLYP (Procedure #1);  Surgeon: Jonnie Kind, MD;  Location: [facility];  Service: Gynecology;  Laterality: N/A;   TUBAL LIGATION       OB History     Gravida  3   Para  3   Term      Preterm      AB      Living  3      SAB      IAB      Ectopic      Multiple  Live Births              Family History  Problem Relation Age of Onset   Stroke Mother    Heart failure Mother    Cancer Father        lung   Diabetes Brother     Social History   Tobacco Use   Smoking status: Never   Smokeless tobacco: Never  Vaping Use   Vaping Use: Never used  Substance Use Topics   Alcohol use: No   Drug use: No    Home Medications Prior to Admission medications   Medication Sig Start Date End Date Taking? Authorizing Provider  acetaminophen (TYLENOL) 325 MG tablet Take 650 mg by mouth every 6 (six) hours as needed for moderate pain or headache.   Yes  [provider]  albuterol (PROVENTIL HFA;VENTOLIN HFA) 108 (90 Base) MCG/ACT inhaler  09/18/17  Yes [provider]  ALPRAZolam (XANAX) 1 MG tablet Take 1 mg by mouth 3 (three) times daily.   Yes [provider]  buPROPion (WELLBUTRIN SR) 150 MG 12 hr tablet Take 150 mg by mouth 2 (two) times daily. 11/07/20  Yes [provider]  dicyclomine (BENTYL) 10 MG capsule Take 10 mg by mouth 3 (three) times daily as needed for spasms. 11/14/20  Yes [provider]  hydrochlorothiazide (MICROZIDE) 12.5 MG capsule Take 12.5 mg by mouth daily. 05/28/20  Yes [provider]  ibuprofen (ADVIL) 200 MG tablet Take 400 mg by mouth every 4 (four) hours as needed.   Yes [provider]  loperamide (IMODIUM) 2 MG capsule Take 2 mg by mouth 2 (two) times daily. 11/05/20  Yes [provider]  losartan (COZAAR) 50 MG tablet Take 50 mg by mouth daily.  09/01/13  Yes [provider]  omeprazole (PRILOSEC) 20 MG capsule Take 1 capsule (20 mg total) by mouth daily. 11/18/20 12/18/20 Yes Marcello Fennel, PA-C  ondansetron (ZOFRAN ODT) 4 MG disintegrating tablet Take 1 tablet (4 mg total) by mouth every 8 (eight) hours as needed for nausea or vomiting. 11/03/20  Yes Truddie Hidden, MD  oxyCODONE-acetaminophen (PERCOCET/ROXICET) 5-325 MG tablet Take 1 tablet by mouth 2 (two) times daily as needed for pain. 07/26/20  Yes [provider]  potassium chloride SA (KLOR-CON) 20 MEQ tablet Take 20 mEq by mouth daily. 08/02/20  Yes [provider]  pravastatin (PRAVACHOL) 40 MG tablet Take 40 mg by mouth daily. 05/20/12  Yes [provider]  ibuprofen (ADVIL,MOTRIN) 200 MG tablet Take 200-400 mg by mouth every 6 (six) hours as needed for headache or moderate pain.    [provider]    Allergies    Naprosyn [naproxen], Penicillins, and Promethazine hcl  Review of Systems   Review of Systems  Constitutional:  Negative  for chills and fever.  HENT:  Negative for congestion.   Respiratory:  Negative for shortness of breath.   Cardiovascular:  Negative for chest pain.  Gastrointestinal:  Positive for abdominal pain, nausea and vomiting. Negative for constipation and diarrhea.  Genitourinary:  Negative for enuresis, frequency and urgency.  Musculoskeletal:  Negative for back pain.  Skin:  Negative for rash.  Neurological:  Negative for headaches.  Hematological:  Does not bruise/bleed easily.   Physical Exam Updated Vital Signs BP 140/87   Pulse 90   Temp 98.3 F (36.8 C) (Oral)   Resp 16   Ht 5' (1.524 m)   Wt 60.8 kg   LMP 09/06/2012  SpO2 100%   BMI 26.17 kg/m   Physical Exam Vitals and nursing note reviewed.  Constitutional:      General: She is not in acute distress.    Appearance: She is not ill-appearing.  HENT:     Head: Normocephalic and atraumatic.     Nose: No congestion.  Eyes:     Conjunctiva/sclera: Conjunctivae normal.  Cardiovascular:     Rate and Rhythm: Normal rate and regular rhythm.     Pulses: Normal pulses.     Heart sounds: No murmur heard.   No friction rub. No gallop.  Pulmonary:     Effort: No respiratory distress.     Breath sounds: No wheezing, rhonchi or rales.  Abdominal:     Palpations: Abdomen is soft.     Tenderness: There is abdominal tenderness. There is no right CVA tenderness or left CVA tenderness.     Comments: Abdomen nondistended, normal bowel sounds, dull to percussion, slight tenderness to palpation in the lower abdomen does not focalized, guarding, rebound tenderness, peritoneal sign, negative CVA tenderness.  Musculoskeletal:     Right lower leg: No edema.     Left lower leg: No edema.  Skin:    General: Skin is warm and dry.  Neurological:     Mental Status: She is alert.  Psychiatric:        Mood and Affect: Mood normal.    ED Results / Procedures / Treatments   Labs (all labs ordered are listed, but only abnormal results are  displayed) Labs Reviewed  COMPREHENSIVE METABOLIC PANEL - Abnormal; Notable for the following components:      Result Value   Potassium 3.4 (*)    CO2 21 (*)    Glucose, Bld 103 (*)    Creatinine, Ser 1.13 (*)    Calcium 10.5 (*)    Total Bilirubin 1.6 (*)    GFR, Estimated 55 (*)    All other components within normal limits  URINALYSIS, ROUTINE W REFLEX MICROSCOPIC - Abnormal; Notable for the following components:   APPearance HAZY (*)    Leukocytes,Ua SMALL (*)    All other components within normal limits  URINALYSIS, MICROSCOPIC (REFLEX) - Abnormal; Notable for the following components:   Bacteria, UA RARE (*)    All other components within normal limits  LIPASE, BLOOD  CBC    EKG None  Radiology CT ABDOMEN PELVIS W CONTRAST  Result Date: 11/18/2020 CLINICAL DATA:  Mid abdominal pain, nausea for 1 week EXAM: CT ABDOMEN AND PELVIS WITH CONTRAST TECHNIQUE: Multidetector CT imaging of the abdomen and pelvis was performed using the standard protocol following bolus administration of intravenous contrast. CONTRAST:  49m OMNIPAQUE IOHEXOL 350 MG/ML SOLN COMPARISON:  11/04/2020, 07/03/2005 FINDINGS: Lower chest: No acute pleural or parenchymal lung disease. Hepatobiliary: Mild diffuse hepatic steatosis. Stable subcentimeter hypodensities within the right lobe liver likely cysts. No intrahepatic duct dilation. The gallbladder is unremarkable. Pancreas: Unremarkable. No pancreatic ductal dilatation or surrounding inflammatory changes. Spleen: Nonspecific hypodensities within the spleen are stable and likely benign. These may reflect hemangiomas, as they are less pronounced on delayed imaging. Spleen is not enlarged. Adrenals/Urinary Tract: There is a chronic 15 mm left adrenal nodule likely adenoma. Right adrenal is unremarkable. Kidneys enhance normally and symmetrically. No urinary tract calculi or obstructive uropathy. The bladder is unremarkable. Stomach/Bowel: No bowel obstruction or  ileus. Normal appendix right lower quadrant. There is scattered diverticulosis throughout the descending and sigmoid colon with no signs of acute diverticulitis. Mild diffuse  wall thickening of the descending and sigmoid colon unchanged, which may reflect scarring from previous bouts of diverticulitis. Vascular/Lymphatic: Aortic atherosclerosis. No enlarged abdominal or pelvic lymph nodes. Reproductive: Uterus and bilateral adnexa are unremarkable. Other: No free fluid or free gas.  No abdominal wall hernia. Musculoskeletal: No acute or destructive bony lesions. Reconstructed images demonstrate no additional findings. IMPRESSION: 1. No acute intra-abdominal or intrapelvic process. 2. Distal colonic diverticulosis without evidence of acute diverticulitis. 3. Hepatic steatosis. 4.  Aortic Atherosclerosis (ICD10-I70.0). Electronically Signed   By: Randa Ngo M.D.   On: 11/18/2020 21:56    Procedures Procedures   Medications Ordered in ED Medications  morphine 4 MG/ML injection 4 mg (has no administration in time range)  sodium chloride 0.9 % bolus 1,000 mL (0 mLs Intravenous Stopped 11/18/20 2118)  ondansetron (ZOFRAN) injection 4 mg (4 mg Intravenous Given 11/18/20 2027)  dicyclomine (BENTYL) injection 20 mg (20 mg Intramuscular Given 11/18/20 2029)  iohexol (OMNIPAQUE) 350 MG/ML injection 80 mL (80 mLs Intravenous Contrast Given 11/18/20 2134)    ED Course  I have reviewed the triage vital signs and the nursing notes.  Pertinent labs & imaging results that were available during my care of the patient were reviewed by me and considered in my medical decision making (see chart for details).    MDM Rules/Calculators/A&P                          Initial impression-patient presents with abdominal pain nausea and vomiting.  She is alert, does not appear in acute stress, vital signs reassuring.  Will obtain basic lab work-up, provide patient fluids, antiemetics, Bentyl and reassess.  Work-up-CBC is  unremarkable, CMP shows slight hypokalemia 3.4, CO2 of 21, glucose 103, creatinine 1.13 calcium 10.5, total T bili 1.5, GFR 55.  Lipase is 29, UA shows small leukocytes, no red blood cells, few white blood cells, rare bacteria, squamous cells present.  CT abdomen pelvis reveals no acute anterior abdominal or intrapelvic process.  Distal diverticulosis without diverticulitis.  Reassessment-patient was updated on lab work and imaging, states that she is feeling better, has no complaints this time.  Will p.o. challenge and reassess.  Patient is tolerating p.o., vital signs remained stable, patient agreed for discharge at this time.  Rule out-low suspicion for systemic infection as patient is nontoxic-appearing, vital signs reassuring, no leukocytosis present.   low suspicion for UTI, pyelonephritis, kidney stone as  patient denies urinary symptoms, she has no CVA tenderness.  UA is slightly abnormal but there are squamous cells present which I suspect might be altering the results.  Will defer antibiotic treatment and culture at this time.  I have low suspicion for AAA and/or dissection as presentation is atypical of etiology CT imaging is negative.  Low suspicion for bowel obstruction, diverticulitis, pancreatitis and abdominal abscess as CT imaging negative for these findings.  Plan-  Abdominal pain since resolved-likely patient setting from gastritis, will provide her with a PPI continue with Zofran, Bentyl follow-up with GI and/or PCP for further evaluation   Vital signs have remained stable, no indication for hospital admission.    Patient given at home care as well strict return precautions.  Patient verbalized that they understood agreed to said plan.  Final Clinical Impression(s) / ED Diagnoses Final diagnoses:  Lower abdominal pain    Rx / DC Orders ED Discharge Orders          Ordered    omeprazole (PRILOSEC) 20  MG capsule  Daily        11/18/20 2256             Marcello Fennel, PA-C 11/18/20 2258    Sherwood Gambler, MD 11/19/20 [phone]

## 2020-11-18 NOTE — ED Provider Notes (Signed)
Emergency Medicine Provider Triage Evaluation Note  Julia [name] , a 60 y.o. female  was evaluated in triage.  Pt complains of abdominal pain x2 weeks, located to lower abdomen, aching in nature.  States that she was seen in this ED 2 weeks ago when her symptoms started, having nausea and vomiting at that time and also had a seizure, no history of seizures.  Denies changes in bowel or bladder habits, fevers..  Review of Systems  Positive: Abdominal pain, nausea Negative: Vomiting, fevers, changes in bowel or bladder habits.  Physical Exam  BP 129/89 (BP Location: Right Arm)   Pulse (!) 108   Temp 98.3 F (36.8 C) (Oral)   Resp 20   Ht 5' (1.524 m)   Wt 60.8 kg   LMP 09/06/2012   SpO2 99%   BMI 26.17 kg/m  Gen:   Awake, no distress   Resp:  Normal effort  MSK:   Moves extremities without difficulty  Other:  Mild lower abdominal tenderness  Medical Decision Making  Medically screening exam initiated at 6:25 PM.  Appropriate orders placed.  Farris Has was informed that the remainder of the evaluation will be completed by another provider, this initial triage assessment does not replace that evaluation, and the importance of remaining in the ED until their evaluation is complete.     Tacy Learn, PA-C 11/18/20 [place]    Sherwood Gambler, MD 11/19/20 [phone]

## 2020-11-18 NOTE — ED Triage Notes (Signed)
Pt c/o mid abdominal pain with nausea x 1 week. Denies v/d. States she saw PCP and gave her nausea medicine and it has not helped.

## 2020-11-20 ENCOUNTER — Emergency Department (HOSPITAL_COMMUNITY)
Admission: EM | Admit: 2020-11-20 | Discharge: 2020-11-21 | Disposition: A | Discharge status: Home / Self Care | Payer: Medicare HMO | Attending: Emergency Medicine | Admitting: Emergency Medicine

## 2020-11-20 ENCOUNTER — Encounter (HOSPITAL_COMMUNITY): Payer: Self-pay | Admitting: Emergency Medicine

## 2020-11-20 ENCOUNTER — Other Ambulatory Visit: Payer: Self-pay

## 2020-11-20 DIAGNOSIS — Z86011 Personal history of benign neoplasm of the brain: Secondary | ICD-10-CM | POA: Insufficient documentation

## 2020-11-20 DIAGNOSIS — Z79899 Other long term (current) drug therapy: Secondary | ICD-10-CM | POA: Diagnosis not present

## 2020-11-20 DIAGNOSIS — E860 Dehydration: Secondary | ICD-10-CM | POA: Insufficient documentation

## 2020-11-20 DIAGNOSIS — Z20822 Contact with and (suspected) exposure to covid-19: Secondary | ICD-10-CM | POA: Diagnosis not present

## 2020-11-20 DIAGNOSIS — N179 Acute kidney failure, unspecified: Secondary | ICD-10-CM | POA: Insufficient documentation

## 2020-11-20 DIAGNOSIS — R109 Unspecified abdominal pain: Secondary | ICD-10-CM | POA: Diagnosis not present

## 2020-11-20 DIAGNOSIS — I10 Essential (primary) hypertension: Secondary | ICD-10-CM | POA: Diagnosis not present

## 2020-11-20 DIAGNOSIS — R112 Nausea with vomiting, unspecified: Secondary | ICD-10-CM | POA: Insufficient documentation

## 2020-11-20 DIAGNOSIS — R1084 Generalized abdominal pain: Secondary | ICD-10-CM | POA: Diagnosis not present

## 2020-11-20 LAB — CBC WITH DIFFERENTIAL/PLATELET
Abs Immature Granulocytes: 0.02 10*3/uL (ref 0.00–0.07)
Basophils Absolute: 0.1 10*3/uL (ref 0.0–0.1)
Basophils Relative: 1 %
Eosinophils Absolute: 0 10*3/uL (ref 0.0–0.5)
Eosinophils Relative: 0 %
HCT: 43.1 % (ref 36.0–46.0)
Hemoglobin: 14.5 g/dL (ref 12.0–15.0)
Immature Granulocytes: 0 %
Lymphocytes Relative: 23 %
Lymphs Abs: 1.7 10*3/uL (ref 0.7–4.0)
MCH: 30.1 pg (ref 26.0–34.0)
MCHC: 33.6 g/dL (ref 30.0–36.0)
MCV: 89.4 fL (ref 80.0–100.0)
Monocytes Absolute: 0.6 10*3/uL (ref 0.1–1.0)
Monocytes Relative: 8 %
Neutro Abs: 5 10*3/uL (ref 1.7–7.7)
Neutrophils Relative %: 68 %
Platelets: 324 10*3/uL (ref 150–400)
RBC: 4.82 MIL/uL (ref 3.87–5.11)
RDW: 13.4 % (ref 11.5–15.5)
WBC: 7.4 10*3/uL (ref 4.0–10.5)
nRBC: 0 % (ref 0.0–0.2)

## 2020-11-20 LAB — RESP PANEL BY RT-PCR (FLU A&B, COVID) ARPGX2
Influenza A by PCR: NEGATIVE
Influenza B by PCR: NEGATIVE
SARS Coronavirus 2 by RT PCR: NEGATIVE

## 2020-11-20 LAB — COMPREHENSIVE METABOLIC PANEL
ALT: 40 U/L (ref 0–44)
AST: 51 U/L — ABNORMAL HIGH (ref 15–41)
Albumin: 3.9 g/dL (ref 3.5–5.0)
Alkaline Phosphatase: 93 U/L (ref 38–126)
Anion gap: 12 (ref 5–15)
BUN: 6 mg/dL — ABNORMAL LOW (ref 8–23)
CO2: 21 mmol/L — ABNORMAL LOW (ref 22–32)
Calcium: 11 mg/dL — ABNORMAL HIGH (ref 8.9–10.3)
Chloride: 104 mmol/L (ref 98–111)
Creatinine, Ser: 1.11 mg/dL — ABNORMAL HIGH (ref 0.44–1.00)
GFR, Estimated: 57 mL/min — ABNORMAL LOW (ref >60)
Glucose, Bld: 103 mg/dL — ABNORMAL HIGH (ref 70–99)
Potassium: 3.4 mmol/L — ABNORMAL LOW (ref 3.5–5.1)
Sodium: 137 mmol/L (ref 135–145)
Total Bilirubin: 1.9 mg/dL — ABNORMAL HIGH (ref 0.3–1.2)
Total Protein: 7 g/dL (ref 6.5–8.1)

## 2020-11-20 LAB — LIPASE, BLOOD: Lipase: 30 U/L (ref 11–51)

## 2020-11-20 MED ORDER — LORAZEPAM 1 MG PO TABS
1.0000 mg | ORAL_TABLET | Freq: Once | ORAL | Status: AC
  Administered 2020-11-20: 1 mg via ORAL
  Filled 2020-11-20: qty 1

## 2020-11-20 MED ORDER — ONDANSETRON HCL 4 MG/2ML IJ SOLN
4.0000 mg | Freq: Once | INTRAMUSCULAR | Status: AC
  Administered 2020-11-20: 4 mg via INTRAVENOUS
  Filled 2020-11-20: qty 2

## 2020-11-20 MED ORDER — SODIUM CHLORIDE 0.9 % IV BOLUS
1000.0000 mL | Freq: Once | INTRAVENOUS | Status: AC
  Administered 2020-11-20: 1000 mL via INTRAVENOUS

## 2020-11-20 NOTE — ED Provider Notes (Signed)
[name] EMERG[facility]MENT Provider Note   CSN: [id number] Arrival date & time: 11/20/20  1209     History Chief Complaint  Patient presents with   Abdominal Pain   Vomiting    Julia [name] is a 60 y.o. female.  Pt is a 60 yo female presenting to ED for abdominal pain. Pt admits to diffuse generalized abdominal pain without radiation associated with nausea and vomiting x 1 week. Pt states she is unable to keep any food down at this time due to vomiting several minutes after eating. Admits to regular bowel movements, no constipation. Denies fevers, chills, or sick contacts.   Pt was seen at [facility] two days ago for same symptoms with negative workup, UA, and CT abdomen.   The history is provided by the patient. No language interpreter was used.  Abdominal Pain Associated symptoms: nausea and vomiting   Associated symptoms: no chest pain, no chills, no cough, no dysuria, no fever, no hematuria, no shortness of breath and no sore throat       Past Medical History:  Diagnosis Date   Anxiety    Brain tumor (benign) (HCC)    Chronic pain    Elevated cholesterol    Headache(784.0)    Hypertension    Numerous moles    Seizures ([place])    Trichimoniasis     Patient Active Problem List   Diagnosis Date Noted   Abdominal pain 11/05/2020   Nausea & vomiting 11/05/2020   Hypokalemia 11/05/2020   Lactic acidosis 11/05/2020   Leukocytosis 11/05/2020   Hyperglycemia 11/05/2020   Elevated AST (SGOT) 11/05/2020   Hyperlipidemia 11/05/2020   Anxiety 11/05/2020   Seizure ([place]) 11/04/2020   Long-term current use of opiate analgesic 11/04/2020   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   Chronic headache disorder 11/04/2020   Chronic low back pain 11/04/2020   Essential hypertension 11/04/2020   Migraine without aura 11/04/2020   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   Pain radiating to neck 11/04/2020   Neck pain 04/19/2020    Thickened endometrium 06/17/2017   Screening for colorectal cancer 06/09/2017   Encounter for gynecological examination with Papanicolaou smear of cervix 06/09/2017   PMB (postmenopausal bleeding) 06/09/2017   Drug-induced constipation 09/30/2012   Colon cancer screening 09/30/2012   Trichomoniasis of vagina 08/31/2012   Pelvic relaxation due to cystocele, midline 06/02/2012   Rectocele 06/02/2012    Past Surgical History:  Procedure Laterality Date   EXCISION OF SKIN TAG N/A 09/22/2017   Procedure: EXCISION OF SKIN TAGS ON FACE AND NECK (Procedure #2);  Surgeon: Jonnie Kind, MD;  Location: [facility];  Service: Gynecology;  Laterality: N/A;   HYSTEROSCOPY WITH D & C N/A 09/22/2017   Procedure: DILATATION AND CURETTAGE /HYSTEROSCOPY; EXCISION OF VULVAR AND RIGHT AND LEFT THIGH SKIN TAGS (Procedure #1);  Surgeon: Jonnie Kind, MD;  Location: [facility];  Service: Gynecology;  Laterality: N/A;   POLYPECTOMY N/A 09/22/2017   Procedure: REMOVAL OF ENDOMETRIAL POLYP (Procedure #1);  Surgeon: Jonnie Kind, MD;  Location: [facility];  Service: Gynecology;  Laterality: N/A;   TUBAL LIGATION       OB History     Gravida  3   Para  3   Term      Preterm      AB      Living  3      SAB      IAB      Ectopic  Multiple      Live Births              Family History  Problem Relation Age of Onset   Stroke Mother    Heart failure Mother    Cancer Father        lung   Diabetes Brother     Social History   Tobacco Use   Smoking status: Never   Smokeless tobacco: Never  Vaping Use   Vaping Use: Never used  Substance Use Topics   Alcohol use: No   Drug use: No    Home Medications Prior to Admission medications   Medication Sig Start Date End Date Taking? Authorizing Provider  acetaminophen (TYLENOL) 325 MG tablet Take 650 mg by mouth every 6 (six) hours as needed for moderate pain or headache.    [provider]  albuterol (PROVENTIL HFA;VENTOLIN  HFA) 108 (90 Base) MCG/ACT inhaler  09/18/17   [provider]  ALPRAZolam Duanne Moron) 1 MG tablet Take 1 mg by mouth 3 (three) times daily.    [provider]  buPROPion (WELLBUTRIN SR) 150 MG 12 hr tablet Take 150 mg by mouth 2 (two) times daily. 11/07/20   [provider]  dicyclomine (BENTYL) 10 MG capsule Take 10 mg by mouth 3 (three) times daily as needed for spasms. 11/14/20   [provider]  hydrochlorothiazide (MICROZIDE) 12.5 MG capsule Take 12.5 mg by mouth daily. 05/28/20   [provider]  ibuprofen (ADVIL) 200 MG tablet Take 400 mg by mouth every 4 (four) hours as needed.    [provider]  ibuprofen (ADVIL,MOTRIN) 200 MG tablet Take 200-400 mg by mouth every 6 (six) hours as needed for headache or moderate pain.    [provider]  loperamide (IMODIUM) 2 MG capsule Take 2 mg by mouth 2 (two) times daily. 11/05/20   [provider]  losartan (COZAAR) 50 MG tablet Take 50 mg by mouth daily.  09/01/13   [provider]  omeprazole (PRILOSEC) 20 MG capsule Take 1 capsule (20 mg total) by mouth daily. 11/18/20 12/18/20  Marcello Fennel, PA-C  ondansetron (ZOFRAN ODT) 4 MG disintegrating tablet Take 1 tablet (4 mg total) by mouth every 8 (eight) hours as needed for nausea or vomiting. 11/03/20   Truddie Hidden, MD  oxyCODONE-acetaminophen (PERCOCET/ROXICET) 5-325 MG tablet Take 1 tablet by mouth 2 (two) times daily as needed for pain. 07/26/20   [provider]  potassium chloride SA (KLOR-CON) 20 MEQ tablet Take 20 mEq by mouth daily. 08/02/20   [provider]  pravastatin (PRAVACHOL) 40 MG tablet Take 40 mg by mouth daily. 05/20/12   [provider]    Allergies    Naprosyn [naproxen], Penicillins, and Promethazine hcl  Review of Systems   Review of Systems  Constitutional:  Negative for chills and fever.  HENT:  Negative for ear pain and sore throat.   Eyes:  Negative for pain and  visual disturbance.  Respiratory:  Negative for cough and shortness of breath.   Cardiovascular:  Negative for chest pain and palpitations.  Gastrointestinal:  Positive for abdominal pain, nausea and vomiting.  Genitourinary:  Negative for dysuria and hematuria.  Musculoskeletal:  Negative for arthralgias and back pain.  Skin:  Negative for color change and rash.  Neurological:  Negative for seizures and syncope.  All other systems reviewed and are negative.  Physical Exam Updated Vital Signs BP (!) 141/96 (BP Location: Right Arm)  Pulse 99   Temp 98.5 F (36.9 C) (Oral)   Resp 18   LMP 09/06/2012   SpO2 99%   Physical Exam Vitals and nursing note reviewed.  Constitutional:      General: She is not in acute distress.    Appearance: She is well-developed.  HENT:     Head: Normocephalic and atraumatic.  Eyes:     Conjunctiva/sclera: Conjunctivae normal.  Cardiovascular:     Rate and Rhythm: Normal rate and regular rhythm.     Heart sounds: No murmur heard. Pulmonary:     Effort: Pulmonary effort is normal. No respiratory distress.     Breath sounds: Normal breath sounds.  Abdominal:     Palpations: Abdomen is soft.     Tenderness: There is generalized abdominal tenderness. There is no guarding or rebound.  Musculoskeletal:     Cervical back: Neck supple.  Skin:    General: Skin is warm and dry.  Neurological:     Mental Status: She is alert.     GCS: GCS eye subscore is 4. GCS verbal subscore is 5. GCS motor subscore is 6.    ED Results / Procedures / Treatments   Labs (all labs ordered are listed, but only abnormal results are displayed) Labs Reviewed  COMPREHENSIVE METABOLIC PANEL - Abnormal; Notable for the following components:      Result Value   Potassium 3.4 (*)    CO2 21 (*)    Glucose, Bld 103 (*)    BUN 6 (*)    Creatinine, Ser 1.11 (*)    Calcium 11.0 (*)    AST 51 (*)    Total Bilirubin 1.9 (*)    GFR, Estimated 57 (*)    All other components  within normal limits  RESP PANEL BY RT-PCR (FLU A&B, COVID) ARPGX2  CBC WITH DIFFERENTIAL/PLATELET  LIPASE, BLOOD  URINALYSIS, ROUTINE W REFLEX MICROSCOPIC    EKG None  Radiology No results found.  Procedures Procedures   Medications Ordered in ED Medications  sodium chloride 0.9 % bolus 1,000 mL (has no administration in time range)  ondansetron (ZOFRAN) injection 4 mg (has no administration in time range)  LORazepam (ATIVAN) tablet 1 mg (has no administration in time range)    ED Course  I have reviewed the triage vital signs and the nursing notes.  Pertinent labs & imaging results that were available during my care of the patient were reviewed by me and considered in my medical decision making (see chart for details).    MDM Rules/Calculators/A&P                           10:29 PM  60 yo female presenting to ED for abdominal pain, nausea, vomiting, and inability to tolerate PO. Patient is Aox3, no acute distress, with stable vitals. Physicial exam demonstrates soft abdomen with tenderness in all quadrants.   Lab studies demonstrate no signs/symptoms of sepsis. Stable liver profile and lipase. AKI present likely secondary to dehydration. IV fluids given and zofran for nausea. Pt just had CT abdomen/pelvis completed two days ago but reordered today due to worsening symptoms and worsening of vomiting after eating.   Pt signed out to oncoming physician while awaiting CT abdomen results. Plan for DC home with prescription for zofran and close follow up with GI if CT negative.       Final Clinical Impression(s) / ED Diagnoses Final diagnoses:  Nausea and vomiting, intractability of vomiting  not specified, unspecified vomiting type  Dehydration  AKI (acute kidney injury) ([place])  Generalized abdominal pain    Rx / DC Orders ED Discharge Orders     None        Lianne Cure, DO [id number]

## 2020-11-20 NOTE — ED Triage Notes (Signed)
Pt states she has been having abd pain and vomiting for a week. States she was seen at [facility] and they did not find anything.

## 2020-11-20 NOTE — ED Provider Notes (Signed)
Emergency Medicine Provider Triage Evaluation Note  LOLETTA EDERER , a 60 y.o. female  was evaluated in triage.  Pt complains of nv and abd pain for 1 week.  Review of Systems  Positive: Abd pain, nv Negative: fever  Physical Exam  BP (!) 116/103 (BP Location: Left Arm)   Pulse (!) 106   Temp 98.6 F (37 C) (Oral)   Resp 18   LMP 09/06/2012   SpO2 100%  Gen:   Awake, no distress   Resp:  Normal effort  MSK:   Moves extremities without difficulty  Other:  No focal ttp  Medical Decision Making  Medically screening exam initiated at 12:32 PM.  Appropriate orders placed.  Farris Has was informed that the remainder of the evaluation will be completed by another provider, this initial triage assessment does not replace that evaluation, and the importance of remaining in the ED until their evaluation is complete.     Bishop Dublin 11/20/20 1232    Hayden Rasmussen, MD 11/20/20 [phone]

## 2020-11-21 ENCOUNTER — Encounter (INDEPENDENT_AMBULATORY_CARE_PROVIDER_SITE_OTHER): Payer: Self-pay | Admitting: *Deleted

## 2020-11-21 ENCOUNTER — Emergency Department (HOSPITAL_COMMUNITY): Payer: Medicare HMO

## 2020-11-21 DIAGNOSIS — R109 Unspecified abdominal pain: Secondary | ICD-10-CM | POA: Diagnosis not present

## 2020-11-21 DIAGNOSIS — R1084 Generalized abdominal pain: Secondary | ICD-10-CM | POA: Diagnosis not present

## 2020-11-21 MED ORDER — IOHEXOL 350 MG/ML SOLN
80.0000 mL | Freq: Once | INTRAVENOUS | Status: AC | PRN
  Administered 2020-11-21: 80 mL via INTRAVENOUS

## 2020-11-21 MED ORDER — ONDANSETRON HCL 4 MG PO TABS: 4.0000 mg | ORAL_TABLET | Freq: Four times a day (QID) | ORAL | 0 refills | Status: DC

## 2020-11-22 ENCOUNTER — Encounter (INDEPENDENT_AMBULATORY_CARE_PROVIDER_SITE_OTHER): Payer: Self-pay | Admitting: Internal Medicine

## 2020-12-04 DIAGNOSIS — E876 Hypokalemia: Secondary | ICD-10-CM | POA: Diagnosis not present

## 2020-12-04 DIAGNOSIS — G894 Chronic pain syndrome: Secondary | ICD-10-CM | POA: Diagnosis not present

## 2020-12-04 DIAGNOSIS — Q8500 Neurofibromatosis, unspecified: Secondary | ICD-10-CM | POA: Diagnosis not present

## 2020-12-04 DIAGNOSIS — I10 Essential (primary) hypertension: Secondary | ICD-10-CM | POA: Diagnosis not present

## 2020-12-04 DIAGNOSIS — G5603 Carpal tunnel syndrome, bilateral upper limbs: Secondary | ICD-10-CM | POA: Diagnosis not present

## 2020-12-04 DIAGNOSIS — K219 Gastro-esophageal reflux disease without esophagitis: Secondary | ICD-10-CM | POA: Diagnosis not present

## 2020-12-04 DIAGNOSIS — D510 Vitamin B12 deficiency anemia due to intrinsic factor deficiency: Secondary | ICD-10-CM | POA: Diagnosis not present

## 2020-12-06 DIAGNOSIS — E213 Hyperparathyroidism, unspecified: Secondary | ICD-10-CM | POA: Diagnosis not present

## 2020-12-10 ENCOUNTER — Emergency Department (HOSPITAL_COMMUNITY)
Admission: EM | Admit: 2020-12-10 | Discharge: 2020-12-11 | Disposition: A | Discharge status: Home / Self Care | Payer: Medicare HMO | Attending: Emergency Medicine | Admitting: Emergency Medicine

## 2020-12-10 ENCOUNTER — Other Ambulatory Visit: Payer: Self-pay

## 2020-12-10 DIAGNOSIS — Z86011 Personal history of benign neoplasm of the brain: Secondary | ICD-10-CM | POA: Insufficient documentation

## 2020-12-10 DIAGNOSIS — R110 Nausea: Secondary | ICD-10-CM | POA: Diagnosis not present

## 2020-12-10 DIAGNOSIS — I10 Essential (primary) hypertension: Secondary | ICD-10-CM | POA: Insufficient documentation

## 2020-12-10 DIAGNOSIS — Z79899 Other long term (current) drug therapy: Secondary | ICD-10-CM | POA: Insufficient documentation

## 2020-12-10 DIAGNOSIS — R1030 Lower abdominal pain, unspecified: Secondary | ICD-10-CM | POA: Diagnosis present

## 2020-12-10 DIAGNOSIS — R109 Unspecified abdominal pain: Secondary | ICD-10-CM | POA: Diagnosis not present

## 2020-12-10 DIAGNOSIS — K5792 Diverticulitis of intestine, part unspecified, without perforation or abscess without bleeding: Secondary | ICD-10-CM | POA: Diagnosis not present

## 2020-12-10 LAB — URINALYSIS, ROUTINE W REFLEX MICROSCOPIC
Bilirubin Urine: NEGATIVE
Glucose, UA: NEGATIVE mg/dL
Hgb urine dipstick: NEGATIVE
Ketones, ur: NEGATIVE mg/dL
Nitrite: NEGATIVE
Protein, ur: NEGATIVE mg/dL
Specific Gravity, Urine: 1.011 (ref 1.005–1.030)
pH: 6 (ref 5.0–8.0)

## 2020-12-10 LAB — CBC
HCT: 40.6 % (ref 36.0–46.0)
Hemoglobin: 13.5 g/dL (ref 12.0–15.0)
MCH: 30.5 pg (ref 26.0–34.0)
MCHC: 33.3 g/dL (ref 30.0–36.0)
MCV: 91.9 fL (ref 80.0–100.0)
Platelets: 210 10*3/uL (ref 150–400)
RBC: 4.42 MIL/uL (ref 3.87–5.11)
RDW: 13.7 % (ref 11.5–15.5)
WBC: 8.2 10*3/uL (ref 4.0–10.5)
nRBC: 0 % (ref 0.0–0.2)

## 2020-12-10 LAB — COMPREHENSIVE METABOLIC PANEL
ALT: 27 U/L (ref 0–44)
AST: 28 U/L (ref 15–41)
Albumin: 4 g/dL (ref 3.5–5.0)
Alkaline Phosphatase: 81 U/L (ref 38–126)
Anion gap: 8 (ref 5–15)
BUN: 10 mg/dL (ref 8–23)
CO2: 24 mmol/L (ref 22–32)
Calcium: 10.5 mg/dL — ABNORMAL HIGH (ref 8.9–10.3)
Chloride: 105 mmol/L (ref 98–111)
Creatinine, Ser: 0.9 mg/dL (ref 0.44–1.00)
GFR, Estimated: >60 mL/min (ref >60)
Glucose, Bld: 95 mg/dL (ref 70–99)
Potassium: 3.9 mmol/L (ref 3.5–5.1)
Sodium: 137 mmol/L (ref 135–145)
Total Bilirubin: 1.4 mg/dL — ABNORMAL HIGH (ref 0.3–1.2)
Total Protein: 7 g/dL (ref 6.5–8.1)

## 2020-12-10 LAB — LIPASE, BLOOD: Lipase: 32 U/L (ref 11–51)

## 2020-12-10 NOTE — ED Triage Notes (Signed)
Pt arrives to ED POV. C/oStomach pain since early September. Pt states "pain started after my seizure in August". N&V hour ago.

## 2020-12-11 ENCOUNTER — Emergency Department (HOSPITAL_COMMUNITY)
Admission: EM | Admit: 2020-12-11 | Discharge: 2020-12-11 | Disposition: A | Discharge status: Home / Self Care | Payer: Medicare HMO | Source: Home / Self Care | Attending: Emergency Medicine | Admitting: Emergency Medicine

## 2020-12-11 ENCOUNTER — Other Ambulatory Visit: Payer: Self-pay

## 2020-12-11 ENCOUNTER — Encounter (HOSPITAL_COMMUNITY): Payer: Self-pay | Admitting: Emergency Medicine

## 2020-12-11 ENCOUNTER — Emergency Department (HOSPITAL_COMMUNITY): Payer: Medicare HMO

## 2020-12-11 DIAGNOSIS — Z79899 Other long term (current) drug therapy: Secondary | ICD-10-CM | POA: Insufficient documentation

## 2020-12-11 DIAGNOSIS — R109 Unspecified abdominal pain: Secondary | ICD-10-CM | POA: Diagnosis not present

## 2020-12-11 DIAGNOSIS — R110 Nausea: Secondary | ICD-10-CM | POA: Diagnosis not present

## 2020-12-11 DIAGNOSIS — K5732 Diverticulitis of large intestine without perforation or abscess without bleeding: Secondary | ICD-10-CM | POA: Insufficient documentation

## 2020-12-11 DIAGNOSIS — I10 Essential (primary) hypertension: Secondary | ICD-10-CM | POA: Insufficient documentation

## 2020-12-11 DIAGNOSIS — K5792 Diverticulitis of intestine, part unspecified, without perforation or abscess without bleeding: Secondary | ICD-10-CM | POA: Diagnosis not present

## 2020-12-11 DIAGNOSIS — Z86011 Personal history of benign neoplasm of the brain: Secondary | ICD-10-CM | POA: Insufficient documentation

## 2020-12-11 LAB — CBC WITH DIFFERENTIAL/PLATELET
Abs Immature Granulocytes: 0.01 10*3/uL (ref 0.00–0.07)
Basophils Absolute: 0 10*3/uL (ref 0.0–0.1)
Basophils Relative: 1 %
Eosinophils Absolute: 0 10*3/uL (ref 0.0–0.5)
Eosinophils Relative: 1 %
HCT: 41.3 % (ref 36.0–46.0)
Hemoglobin: 14.2 g/dL (ref 12.0–15.0)
Immature Granulocytes: 0 %
Lymphocytes Relative: 24 %
Lymphs Abs: 1.4 10*3/uL (ref 0.7–4.0)
MCH: 30.7 pg (ref 26.0–34.0)
MCHC: 34.4 g/dL (ref 30.0–36.0)
MCV: 89.2 fL (ref 80.0–100.0)
Monocytes Absolute: 0.4 10*3/uL (ref 0.1–1.0)
Monocytes Relative: 7 %
Neutro Abs: 3.8 10*3/uL (ref 1.7–7.7)
Neutrophils Relative %: 67 %
Platelets: 224 10*3/uL (ref 150–400)
RBC: 4.63 MIL/uL (ref 3.87–5.11)
RDW: 13.6 % (ref 11.5–15.5)
WBC: 5.6 10*3/uL (ref 4.0–10.5)
nRBC: 0 % (ref 0.0–0.2)

## 2020-12-11 LAB — COMPREHENSIVE METABOLIC PANEL
ALT: 29 U/L (ref 0–44)
AST: 29 U/L (ref 15–41)
Albumin: 4.4 g/dL (ref 3.5–5.0)
Alkaline Phosphatase: 92 U/L (ref 38–126)
Anion gap: 9 (ref 5–15)
BUN: 10 mg/dL (ref 8–23)
CO2: 21 mmol/L — ABNORMAL LOW (ref 22–32)
Calcium: 11.1 mg/dL — ABNORMAL HIGH (ref 8.9–10.3)
Chloride: 106 mmol/L (ref 98–111)
Creatinine, Ser: 0.85 mg/dL (ref 0.44–1.00)
GFR, Estimated: >60 mL/min (ref >60)
Glucose, Bld: 119 mg/dL — ABNORMAL HIGH (ref 70–99)
Potassium: 3.7 mmol/L (ref 3.5–5.1)
Sodium: 136 mmol/L (ref 135–145)
Total Bilirubin: 2.3 mg/dL — ABNORMAL HIGH (ref 0.3–1.2)
Total Protein: 7.3 g/dL (ref 6.5–8.1)

## 2020-12-11 MED ORDER — CIPROFLOXACIN HCL 250 MG PO TABS
500.0000 mg | ORAL_TABLET | Freq: Once | ORAL | Status: AC
  Administered 2020-12-11: 500 mg via ORAL
  Filled 2020-12-11: qty 2

## 2020-12-11 MED ORDER — IOHEXOL 300 MG/ML  SOLN
100.0000 mL | Freq: Once | INTRAMUSCULAR | Status: AC | PRN
  Administered 2020-12-11: 100 mL via INTRAVENOUS

## 2020-12-11 MED ORDER — METRONIDAZOLE 500 MG PO TABS
500.0000 mg | ORAL_TABLET | Freq: Once | ORAL | Status: AC
  Administered 2020-12-11: 500 mg via ORAL
  Filled 2020-12-11: qty 1

## 2020-12-11 MED ORDER — ONDANSETRON 4 MG PO TBDP: 4.0000 mg | ORAL_TABLET | Freq: Three times a day (TID) | ORAL | 0 refills | Status: DC | PRN

## 2020-12-11 MED ORDER — SODIUM CHLORIDE 0.9 % IV SOLN
12.5000 mg | Freq: Once | INTRAVENOUS | Status: AC
  Administered 2020-12-11: 12.5 mg via INTRAVENOUS
  Filled 2020-12-11: qty 0.5

## 2020-12-11 MED ORDER — ONDANSETRON HCL 4 MG/2ML IJ SOLN
4.0000 mg | Freq: Once | INTRAMUSCULAR | Status: AC
  Administered 2020-12-11: 4 mg via INTRAVENOUS
  Filled 2020-12-11: qty 2

## 2020-12-11 MED ORDER — PROMETHAZINE HCL 25 MG RE SUPP: 25.0000 mg | Freq: Four times a day (QID) | RECTAL | 1 refills | Status: DC | PRN

## 2020-12-11 MED ORDER — SODIUM CHLORIDE 0.9 % IV BOLUS
1000.0000 mL | Freq: Once | INTRAVENOUS | Status: AC
  Administered 2020-12-11: 1000 mL via INTRAVENOUS

## 2020-12-11 MED ORDER — METRONIDAZOLE 500 MG PO TABS: 500.0000 mg | ORAL_TABLET | Freq: Two times a day (BID) | ORAL | 0 refills | Status: DC

## 2020-12-11 MED ORDER — CIPROFLOXACIN HCL 500 MG PO TABS
500.0000 mg | ORAL_TABLET | Freq: Two times a day (BID) | ORAL | 0 refills | Status: DC
Start: 2020-12-11 — End: 2021-02-07

## 2020-12-11 MED ORDER — MORPHINE SULFATE (PF) 4 MG/ML IV SOLN
4.0000 mg | Freq: Once | INTRAVENOUS | Status: AC
  Administered 2020-12-11: 4 mg via INTRAVENOUS
  Filled 2020-12-11: qty 1

## 2020-12-11 MED ORDER — LORAZEPAM 2 MG/ML IJ SOLN
0.5000 mg | Freq: Once | INTRAMUSCULAR | Status: AC
  Administered 2020-12-11: 0.5 mg via INTRAVENOUS
  Filled 2020-12-11: qty 1

## 2020-12-11 NOTE — ED Provider Notes (Signed)
[facility] EMERGENCY DEPARTMENT Provider Note   CSN: [id number] Arrival date & time: 12/10/20  2050     History Chief Complaint  Patient presents with   Abdominal Pain    Julia [name] is a 60 y.o. female.  HPI     This is a 60 year old female with a history of hypertension, seizures who presents with abdominal pain.  Patient reports that she has had waxing and waning lower abdominal discomfort over the last month.  She describes it as sharp and nonradiating.  Currently her pain is 8 out of 10.  She has had an occasional episode of nonbilious, nonbloody emesis.  No diarrhea.  She denies any fevers or urinary symptoms.  She is not taking anything for symptoms.  She states that symptoms started after she had a seizure in early September.  Past Medical History:  Diagnosis Date   Anxiety    Brain tumor (benign) (HCC)    Chronic pain    Elevated cholesterol    Headache(784.0)    Hypertension    Numerous moles    Seizures ([place])    Trichimoniasis     Patient Active Problem List   Diagnosis Date Noted   Abdominal pain 11/05/2020   Nausea & vomiting 11/05/2020   Hypokalemia 11/05/2020   Lactic acidosis 11/05/2020   Leukocytosis 11/05/2020   Hyperglycemia 11/05/2020   Elevated AST (SGOT) 11/05/2020   Hyperlipidemia 11/05/2020   Anxiety 11/05/2020   Seizure ([place]) 11/04/2020   Long-term current use of opiate analgesic 11/04/2020   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   Chronic headache disorder 11/04/2020   Chronic low back pain 11/04/2020   Essential hypertension 11/04/2020   Migraine without aura 11/04/2020   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   Pain radiating to neck 11/04/2020   Neck pain 04/19/2020   Thickened endometrium 06/17/2017   Screening for colorectal cancer 06/09/2017   Encounter for gynecological examination with Papanicolaou smear of cervix 06/09/2017   PMB (postmenopausal bleeding) 06/09/2017   Drug-induced constipation 09/30/2012    Colon cancer screening 09/30/2012   Trichomoniasis of vagina 08/31/2012   Pelvic relaxation due to cystocele, midline 06/02/2012   Rectocele 06/02/2012    Past Surgical History:  Procedure Laterality Date   EXCISION OF SKIN TAG N/A 09/22/2017   Procedure: EXCISION OF SKIN TAGS ON FACE AND NECK (Procedure #2);  Surgeon: Jonnie Kind, MD;  Location: [facility];  Service: Gynecology;  Laterality: N/A;   HYSTEROSCOPY WITH D & C N/A 09/22/2017   Procedure: DILATATION AND CURETTAGE /HYSTEROSCOPY; EXCISION OF VULVAR AND RIGHT AND LEFT THIGH SKIN TAGS (Procedure #1);  Surgeon: Jonnie Kind, MD;  Location: [facility];  Service: Gynecology;  Laterality: N/A;   POLYPECTOMY N/A 09/22/2017   Procedure: REMOVAL OF ENDOMETRIAL POLYP (Procedure #1);  Surgeon: Jonnie Kind, MD;  Location: [facility];  Service: Gynecology;  Laterality: N/A;   TUBAL LIGATION       OB History     Gravida  3   Para  3   Term      Preterm      AB      Living  3      SAB      IAB      Ectopic      Multiple      Live Births              Family History  Problem Relation Age of Onset   Stroke Mother    Heart  failure Mother    Cancer Father        lung   Diabetes Brother     Social History   Tobacco Use   Smoking status: Never   Smokeless tobacco: Never  Vaping Use   Vaping Use: Never used  Substance Use Topics   Alcohol use: No   Drug use: No    Home Medications Prior to Admission medications   Medication Sig Start Date End Date Taking? Authorizing Provider  ciprofloxacin (CIPRO) 500 MG tablet Take 1 tablet (500 mg total) by mouth every 12 (twelve) hours. 12/11/20  Yes Jamarian Jacinto, Barbette Hair, MD  metroNIDAZOLE (FLAGYL) 500 MG tablet Take 1 tablet (500 mg total) by mouth 2 (two) times daily. 12/11/20  Yes Valerio Pinard, Barbette Hair, MD  ondansetron (ZOFRAN ODT) 4 MG disintegrating tablet Take 1 tablet (4 mg total) by mouth every 8 (eight) hours as needed for nausea or vomiting. 12/11/20  Yes  [place]ana Bryngelson, Barbette Hair, MD  acetaminophen (TYLENOL) 325 MG tablet Take 650 mg by mouth every 6 (six) hours as needed for moderate pain or headache.    [provider]  albuterol (PROVENTIL HFA;VENTOLIN HFA) 108 (90 Base) MCG/ACT inhaler  09/18/17   [provider]  ALPRAZolam Duanne Moron) 1 MG tablet Take 1 mg by mouth 3 (three) times daily.    [provider]  buPROPion (WELLBUTRIN SR) 150 MG 12 hr tablet Take 150 mg by mouth 2 (two) times daily. 11/07/20   [provider]  dicyclomine (BENTYL) 10 MG capsule Take 10 mg by mouth 3 (three) times daily as needed for spasms. 11/14/20   [provider]  hydrochlorothiazide (MICROZIDE) 12.5 MG capsule Take 12.5 mg by mouth daily. 05/28/20   [provider]  ibuprofen (ADVIL) 200 MG tablet Take 400 mg by mouth every 4 (four) hours as needed.    [provider]  ibuprofen (ADVIL,MOTRIN) 200 MG tablet Take 200-400 mg by mouth every 6 (six) hours as needed for headache or moderate pain.    [provider]  loperamide (IMODIUM) 2 MG capsule Take 2 mg by mouth 2 (two) times daily. 11/05/20   [provider]  losartan (COZAAR) 50 MG tablet Take 50 mg by mouth daily.  09/01/13   [provider]  omeprazole (PRILOSEC) 20 MG capsule Take 1 capsule (20 mg total) by mouth daily. 11/18/20 12/18/20  Marcello Fennel, PA-C  ondansetron (ZOFRAN ODT) 4 MG disintegrating tablet Take 1 tablet (4 mg total) by mouth every 8 (eight) hours as needed for nausea or vomiting. 11/03/20   Truddie Hidden, MD  ondansetron (ZOFRAN) 4 MG tablet Take 1 tablet (4 mg total) by mouth every 6 (six) hours. 08/13/28   Lianne Cure, DO  oxyCODONE-acetaminophen (PERCOCET/ROXICET) 5-325 MG tablet Take 1 tablet by mouth 2 (two) times daily as needed for pain. 07/26/20   [provider]  potassium chloride SA (KLOR-CON) 20 MEQ tablet Take 20 mEq by mouth daily. 08/02/20   [provider]  pravastatin  (PRAVACHOL) 40 MG tablet Take 40 mg by mouth daily. 05/20/12   [provider]    Allergies    Naprosyn [naproxen], Penicillins, and Promethazine hcl  Review of Systems   Review of Systems  Constitutional:  Negative for fever.  Respiratory:  Negative for shortness of breath.   Cardiovascular:  Negative for chest pain.  Gastrointestinal:  Positive for abdominal pain and vomiting. Negative for diarrhea.  Genitourinary:  Negative for dysuria.  All other systems  reviewed and are negative.  Physical Exam Updated Vital Signs BP 128/79   Pulse 84   Temp 98 F (36.7 C)   Resp 19   Wt 61.7 kg   LMP 09/06/2012   SpO2 100%   BMI 26.56 kg/m   Physical Exam Vitals and nursing note reviewed.  Constitutional:      Appearance: She is well-developed. She is not ill-appearing.     Comments: Chronically ill-appearing, nontoxic  HENT:     Head: Normocephalic and atraumatic.  Eyes:     Pupils: Pupils are equal, round, and reactive to light.  Cardiovascular:     Rate and Rhythm: Normal rate and regular rhythm.     Heart sounds: Normal heart sounds.  Pulmonary:     Effort: Pulmonary effort is normal. No respiratory distress.     Breath sounds: No wheezing.  Abdominal:     General: Bowel sounds are normal.     Palpations: Abdomen is soft.     Tenderness: There is generalized abdominal tenderness. There is no guarding or rebound.  Musculoskeletal:     Cervical back: Neck supple.  Skin:    General: Skin is warm and dry.  Neurological:     Mental Status: She is alert and oriented to person, place, and time.  Psychiatric:        Mood and Affect: Mood normal.    ED Results / Procedures / Treatments   Labs (all labs ordered are listed, but only abnormal results are displayed) Labs Reviewed  COMPREHENSIVE METABOLIC PANEL - Abnormal; Notable for the following components:      Result Value   Calcium 10.5 (*)    Total Bilirubin 1.4 (*)    All other components within normal  limits  URINALYSIS, ROUTINE W REFLEX MICROSCOPIC - Abnormal; Notable for the following components:   Leukocytes,Ua MODERATE (*)    Bacteria, UA RARE (*)    All other components within normal limits  LIPASE, BLOOD  CBC    EKG None  Radiology CT ABDOMEN PELVIS W CONTRAST  Result Date: 12/11/2020 CLINICAL DATA:  Lower abdominal pain. EXAM: CT ABDOMEN AND PELVIS WITH CONTRAST TECHNIQUE: Multidetector CT imaging of the abdomen and pelvis was performed using the standard protocol following bolus administration of intravenous contrast. CONTRAST:  174mL OMNIPAQUE IOHEXOL 300 MG/ML  SOLN COMPARISON:  November 21, 2020 FINDINGS: Lower chest: No acute abnormality. Hepatobiliary: Stable 6 mm and 8 mm foci of parenchymal low attenuation are seen along the liver dome. These areas are too small to characterize by CT examination. Pancreas: Unremarkable. No pancreatic ductal dilatation or surrounding inflammatory changes. Spleen: A stable 1.0 cm cystic appearing areas noted within the posterior aspect of the spleen. Adrenals/Urinary Tract: Adrenal glands are unremarkable. Kidneys are normal in size, without renal calculi or hydronephrosis. A 5 mm cystic appearing areas noted within the anterolateral aspect of the mid right kidney. Bladder is unremarkable. Stomach/Bowel: Stomach is within normal limits. Appendix appears normal. No evidence of bowel dilatation. Numerous diverticula are seen throughout the large bowel. Very mild thickening of the proximal sigmoid colon is seen. Vascular/Lymphatic: No significant vascular findings are present. No enlarged abdominal or pelvic lymph nodes. Reproductive: Uterus and bilateral adnexa are unremarkable. Other: No abdominal wall hernia or abnormality. No abdominopelvic ascites. Musculoskeletal: No acute or significant osseous findings. IMPRESSION: 1. Colonic diverticulosis. 2. Very mild thickening of the proximal sigmoid colon which may represent mild diverticulitis. 3. Stable  cystic appearing areas within the spleen and right kidney. Electronically Signed  By: Virgina Norfolk M.D.   On: 12/11/2020 01:21    Procedures Procedures   Medications Ordered in ED Medications  morphine 4 MG/ML injection 4 mg (4 mg Intravenous Given 12/11/20 0037)  ondansetron (ZOFRAN) injection 4 mg (4 mg Intravenous Given 12/11/20 0037)  iohexol (OMNIPAQUE) 300 MG/ML solution 100 mL (100 mLs Intravenous Contrast Given 12/11/20 0105)  ciprofloxacin (CIPRO) tablet 500 mg (500 mg Oral Given 12/11/20 0138)  metroNIDAZOLE (FLAGYL) tablet 500 mg (500 mg Oral Given 12/11/20 0138)    ED Course  I have reviewed the triage vital signs and the nursing notes.  Pertinent labs & imaging results that were available during my care of the patient were reviewed by me and considered in my medical decision making (see chart for details).    MDM Rules/Calculators/A&P                           Patient presents with abdominal pain.  Subacute nature.  Ongoing.  She is nontoxic and vital signs are reassuring.  She has some diffuse tenderness but no signs of peritonitis.  Considerations include but not limited to, colitis, diverticulitis, less likely appendicitis or cholecystitis.  Patient was given pain and nausea medication.  Labs reviewed and largely reassuring.  CT scan shows thickening of the colon suspicious of mild diverticulitis.  Given duration of symptoms, would elect to treat.  Patient has a penicillin allergy.  We will treat with Cipro and Flagyl.  On recheck, she is tolerating fluids without difficulty.  Discussed with her the diagnosis and plan.  She stated understanding  After history, exam, and medical workup I feel the patient has been appropriately medically screened and is safe for discharge home. Pertinent diagnoses were discussed with the patient. Patient was given return precautions.  Final Clinical Impression(s) / ED Diagnoses Final diagnoses:  Diverticulitis    Rx / DC Orders ED  Discharge Orders          Ordered    ciprofloxacin (CIPRO) 500 MG tablet  Every 12 hours        12/11/20 0229    metroNIDAZOLE (FLAGYL) 500 MG tablet  2 times daily        12/11/20 0229    ondansetron (ZOFRAN ODT) 4 MG disintegrating tablet  Every 8 hours PRN        12/11/20 0229             Merryl Hacker, MD 12/11/20 [phone]

## 2020-12-11 NOTE — ED Notes (Signed)
Pt reports minimal improvement in nausea following medication and IV fluids, continues to report pain 8/10

## 2020-12-11 NOTE — ED Notes (Signed)
Patient transported to CT 

## 2020-12-11 NOTE — Discharge Instructions (Addendum)
You were seen today and found to have evidence of diverticulitis.  Take medications as prescribed.  Follow-up with your primary physician.

## 2020-12-11 NOTE — Discharge Instructions (Signed)
Drink plenty of fluids.  Follow-up with your doctor in next 2 to 3 days for recheck.  Get your antibiotics today

## 2020-12-11 NOTE — ED Triage Notes (Signed)
Pt seen in ED last night for abd pain. Dx with diverticulitis. Pt returned for n/v and "nervous feeling".

## 2020-12-11 NOTE — ED Provider Notes (Signed)
[facility] Provider Note   CSN: [id number] Arrival date & time: 12/11/20  3532     History Chief Complaint  Patient presents with   Nausea    Julia [name] is a 60 y.o. female.  Patient was diagnosed with diverticulitis last night.  She has not picked up her medicines yet.  She states she has been vomiting.  The history is provided by the patient and medical records. No language interpreter was used.  Emesis Severity:  Moderate Timing:  Constant Quality:  Bilious material Able to tolerate:  Liquids Progression:  Unchanged Chronicity:  Recurrent Recent urination:  Normal Relieved by:  Nothing Worsened by:  Nothing Ineffective treatments:  None tried Associated symptoms: abdominal pain   Associated symptoms: no cough, no diarrhea and no headaches       Past Medical History:  Diagnosis Date   Anxiety    Brain tumor (benign) (HCC)    Chronic pain    Elevated cholesterol    Headache(784.0)    Hypertension    Numerous moles    Seizures ([place])    Trichimoniasis     Patient Active Problem List   Diagnosis Date Noted   Abdominal pain 11/05/2020   Nausea & vomiting 11/05/2020   Hypokalemia 11/05/2020   Lactic acidosis 11/05/2020   Leukocytosis 11/05/2020   Hyperglycemia 11/05/2020   Elevated AST (SGOT) 11/05/2020   Hyperlipidemia 11/05/2020   Anxiety 11/05/2020   Seizure ([place]) 11/04/2020   Long-term current use of opiate analgesic 11/04/2020   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   Chronic headache disorder 11/04/2020   Chronic low back pain 11/04/2020   Essential hypertension 11/04/2020   Migraine without aura 11/04/2020   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   Pain radiating to neck 11/04/2020   Neck pain 04/19/2020   Thickened endometrium 06/17/2017   Screening for colorectal cancer 06/09/2017   Encounter for gynecological examination with Papanicolaou smear of cervix 06/09/2017   PMB (postmenopausal bleeding)  06/09/2017   Drug-induced constipation 09/30/2012   Colon cancer screening 09/30/2012   Trichomoniasis of vagina 08/31/2012   Pelvic relaxation due to cystocele, midline 06/02/2012   Rectocele 06/02/2012    Past Surgical History:  Procedure Laterality Date   EXCISION OF SKIN TAG N/A 09/22/2017   Procedure: EXCISION OF SKIN TAGS ON FACE AND NECK (Procedure #2);  Surgeon: Jonnie Kind, MD;  Location: [facility];  Service: Gynecology;  Laterality: N/A;   HYSTEROSCOPY WITH D & C N/A 09/22/2017   Procedure: DILATATION AND CURETTAGE /HYSTEROSCOPY; EXCISION OF VULVAR AND RIGHT AND LEFT THIGH SKIN TAGS (Procedure #1);  Surgeon: Jonnie Kind, MD;  Location: [facility];  Service: Gynecology;  Laterality: N/A;   POLYPECTOMY N/A 09/22/2017   Procedure: REMOVAL OF ENDOMETRIAL POLYP (Procedure #1);  Surgeon: Jonnie Kind, MD;  Location: [facility];  Service: Gynecology;  Laterality: N/A;   TUBAL LIGATION       OB History     Gravida  3   Para  3   Term      Preterm      AB      Living  3      SAB      IAB      Ectopic      Multiple      Live Births              Family History  Problem Relation Age of Onset   Stroke Mother    Heart failure  Mother    Cancer Father        lung   Diabetes Brother     Social History   Tobacco Use   Smoking status: Never   Smokeless tobacco: Never  Vaping Use   Vaping Use: Never used  Substance Use Topics   Alcohol use: No   Drug use: No    Home Medications Prior to Admission medications   Medication Sig Start Date End Date Taking? Authorizing Provider  promethazine (PHENERGAN) 25 MG suppository Place 1 suppository (25 mg total) rectally every 6 (six) hours as needed for nausea or vomiting. 12/11/20  Yes Milton Ferguson, MD  acetaminophen (TYLENOL) 325 MG tablet Take 650 mg by mouth every 6 (six) hours as needed for moderate pain or headache.    [provider]  albuterol (PROVENTIL HFA;VENTOLIN HFA) 108 (90 Base)  MCG/ACT inhaler  09/18/17   [provider]  ALPRAZolam Duanne Moron) 1 MG tablet Take 1 mg by mouth 3 (three) times daily.    [provider]  buPROPion (WELLBUTRIN SR) 150 MG 12 hr tablet Take 150 mg by mouth 2 (two) times daily. 11/07/20   [provider]  ciprofloxacin (CIPRO) 500 MG tablet Take 1 tablet (500 mg total) by mouth every 12 (twelve) hours. 12/11/20   Horton, Barbette Hair, MD  dicyclomine (BENTYL) 10 MG capsule Take 10 mg by mouth 3 (three) times daily as needed for spasms. 11/14/20   [provider]  hydrochlorothiazide (MICROZIDE) 12.5 MG capsule Take 12.5 mg by mouth daily. 05/28/20   [provider]  ibuprofen (ADVIL) 200 MG tablet Take 400 mg by mouth every 4 (four) hours as needed.    [provider]  ibuprofen (ADVIL,MOTRIN) 200 MG tablet Take 200-400 mg by mouth every 6 (six) hours as needed for headache or moderate pain.    [provider]  loperamide (IMODIUM) 2 MG capsule Take 2 mg by mouth 2 (two) times daily. 11/05/20   [provider]  losartan (COZAAR) 50 MG tablet Take 50 mg by mouth daily.  09/01/13   [provider]  metroNIDAZOLE (FLAGYL) 500 MG tablet Take 1 tablet (500 mg total) by mouth 2 (two) times daily. 12/11/20   Horton, Barbette Hair, MD  omeprazole (PRILOSEC) 20 MG capsule Take 1 capsule (20 mg total) by mouth daily. 11/18/20 12/18/20  Marcello Fennel, PA-C  ondansetron (ZOFRAN ODT) 4 MG disintegrating tablet Take 1 tablet (4 mg total) by mouth every 8 (eight) hours as needed for nausea or vomiting. 11/03/20   Truddie Hidden, MD  ondansetron (ZOFRAN ODT) 4 MG disintegrating tablet Take 1 tablet (4 mg total) by mouth every 8 (eight) hours as needed for nausea or vomiting. 12/11/20   Horton, Barbette Hair, MD  ondansetron (ZOFRAN) 4 MG tablet Take 1 tablet (4 mg total) by mouth every 6 (six) hours. 7/98/92   Lianne Cure, DO  oxyCODONE-acetaminophen (PERCOCET/ROXICET) 5-325 MG tablet Take 1  tablet by mouth 2 (two) times daily as needed for pain. 07/26/20   [provider]  potassium chloride SA (KLOR-CON) 20 MEQ tablet Take 20 mEq by mouth daily. 08/02/20   [provider]  pravastatin (PRAVACHOL) 40 MG tablet Take 40 mg by mouth daily. 05/20/12   [provider]    Allergies    Naprosyn [naproxen], Penicillins, and Promethazine hcl  Review of Systems   Review of Systems  Constitutional:  Negative for appetite change and fatigue.  HENT:  Negative for congestion, ear  discharge and sinus pressure.   Eyes:  Negative for discharge.  Respiratory:  Negative for cough.   Cardiovascular:  Negative for chest pain.  Gastrointestinal:  Positive for abdominal pain and vomiting. Negative for diarrhea.  Genitourinary:  Negative for frequency and hematuria.  Musculoskeletal:  Negative for back pain.  Skin:  Negative for rash.  Neurological:  Negative for seizures and headaches.  Psychiatric/Behavioral:  Negative for hallucinations.    Physical Exam Updated Vital Signs BP (!) 130/94   Pulse 95   Temp 97.7 F (36.5 C)   Resp 16   Ht 5' (1.524 m)   Wt 61.7 kg   LMP 09/06/2012   SpO2 100%   BMI 26.56 kg/m   Physical Exam Vitals and nursing note reviewed.  Constitutional:      Appearance: She is well-developed.  HENT:     Head: Normocephalic.     Nose: Nose normal.  Eyes:     General: No scleral icterus.    Conjunctiva/sclera: Conjunctivae normal.  Neck:     Thyroid: No thyromegaly.  Cardiovascular:     Rate and Rhythm: Normal rate and regular rhythm.     Heart sounds: No murmur heard.   No friction rub. No gallop.  Pulmonary:     Breath sounds: No stridor. No wheezing or rales.  Chest:     Chest wall: No tenderness.  Abdominal:     General: There is no distension.     Tenderness: There is abdominal tenderness. There is no rebound.  Musculoskeletal:        General: Normal range of motion.     Cervical back: Neck supple.  Lymphadenopathy:      Cervical: No cervical adenopathy.  Skin:    Findings: No erythema or rash.  Neurological:     Mental Status: She is alert and oriented to person, place, and time.     Motor: No abnormal muscle tone.     Coordination: Coordination normal.  Psychiatric:        Behavior: Behavior normal.    ED Results / Procedures / Treatments   Labs (all labs ordered are listed, but only abnormal results are displayed) Labs Reviewed  COMPREHENSIVE METABOLIC PANEL - Abnormal; Notable for the following components:      Result Value   CO2 21 (*)    Glucose, Bld 119 (*)    Calcium 11.1 (*)    Total Bilirubin 2.3 (*)    All other components within normal limits  CBC WITH DIFFERENTIAL/PLATELET    EKG None  Radiology CT ABDOMEN PELVIS W CONTRAST  Result Date: 12/11/2020 CLINICAL DATA:  Lower abdominal pain. EXAM: CT ABDOMEN AND PELVIS WITH CONTRAST TECHNIQUE: Multidetector CT imaging of the abdomen and pelvis was performed using the standard protocol following bolus administration of intravenous contrast. CONTRAST:  143mL OMNIPAQUE IOHEXOL 300 MG/ML  SOLN COMPARISON:  November 21, 2020 FINDINGS: Lower chest: No acute abnormality. Hepatobiliary: Stable 6 mm and 8 mm foci of parenchymal low attenuation are seen along the liver dome. These areas are too small to characterize by CT examination. Pancreas: Unremarkable. No pancreatic ductal dilatation or surrounding inflammatory changes. Spleen: A stable 1.0 cm cystic appearing areas noted within the posterior aspect of the spleen. Adrenals/Urinary Tract: Adrenal glands are unremarkable. Kidneys are normal in size, without renal calculi or hydronephrosis. A 5 mm cystic appearing areas noted within the anterolateral aspect of the mid right kidney. Bladder is unremarkable. Stomach/Bowel: Stomach is within normal limits. Appendix appears normal. No evidence  of bowel dilatation. Numerous diverticula are seen throughout the large bowel. Very mild thickening of the  proximal sigmoid colon is seen. Vascular/Lymphatic: No significant vascular findings are present. No enlarged abdominal or pelvic lymph nodes. Reproductive: Uterus and bilateral adnexa are unremarkable. Other: No abdominal wall hernia or abnormality. No abdominopelvic ascites. Musculoskeletal: No acute or significant osseous findings. IMPRESSION: 1. Colonic diverticulosis. 2. Very mild thickening of the proximal sigmoid colon which may represent mild diverticulitis. 3. Stable cystic appearing areas within the spleen and right kidney. Electronically Signed   By: Virgina Norfolk M.D.   On: 12/11/2020 01:21    Procedures Procedures   Medications Ordered in ED Medications  promethazine (PHENERGAN) 12.5 mg in sodium chloride 0.9 % 50 mL IVPB (has no administration in time range)  sodium chloride 0.9 % bolus 1,000 mL (0 mLs Intravenous Stopped 12/11/20 1102)  ondansetron (ZOFRAN) injection 4 mg (4 mg Intravenous Given 12/11/20 0840)  LORazepam (ATIVAN) injection 0.5 mg (0.5 mg Intravenous Given 12/11/20 0841)    ED Course  I have reviewed the triage vital signs and the nursing notes.  Pertinent labs & imaging results that were available during my care of the patient were reviewed by me and considered in my medical decision making (see chart for details).    MDM Rules/Calculators/A&P                           Patient with diverticulitis.  And vomiting.  Patient improved with fluids and nausea medicine.  She will pick up her antibiotic prescription from last night and is given Phenergan for nausea because she states Zofran does not help  Final Clinical Impression(s) / ED Diagnoses Final diagnoses:  Nausea    Rx / DC Orders ED Discharge Orders          Ordered    promethazine (PHENERGAN) 25 MG suppository  Every 6 hours PRN        12/11/20 1132             Milton Ferguson, MD 12/15/20 1029

## 2020-12-17 DIAGNOSIS — G894 Chronic pain syndrome: Secondary | ICD-10-CM | POA: Diagnosis not present

## 2020-12-17 DIAGNOSIS — E663 Overweight: Secondary | ICD-10-CM | POA: Diagnosis not present

## 2020-12-17 DIAGNOSIS — E213 Hyperparathyroidism, unspecified: Secondary | ICD-10-CM | POA: Diagnosis not present

## 2020-12-17 DIAGNOSIS — Z6825 Body mass index (BMI) 25.0-25.9, adult: Secondary | ICD-10-CM | POA: Diagnosis not present

## 2020-12-17 DIAGNOSIS — I10 Essential (primary) hypertension: Secondary | ICD-10-CM | POA: Diagnosis not present

## 2020-12-17 DIAGNOSIS — K5792 Diverticulitis of intestine, part unspecified, without perforation or abscess without bleeding: Secondary | ICD-10-CM | POA: Diagnosis not present

## 2020-12-25 ENCOUNTER — Other Ambulatory Visit: Payer: Medicare HMO | Admitting: Adult Health

## 2020-12-31 DIAGNOSIS — Z79891 Long term (current) use of opiate analgesic: Secondary | ICD-10-CM | POA: Diagnosis not present

## 2020-12-31 DIAGNOSIS — M542 Cervicalgia: Secondary | ICD-10-CM | POA: Diagnosis not present

## 2020-12-31 DIAGNOSIS — M545 Low back pain, unspecified: Secondary | ICD-10-CM | POA: Diagnosis not present

## 2020-12-31 DIAGNOSIS — D329 Benign neoplasm of meninges, unspecified: Secondary | ICD-10-CM | POA: Diagnosis not present

## 2020-12-31 DIAGNOSIS — G4489 Other headache syndrome: Secondary | ICD-10-CM | POA: Diagnosis not present

## 2020-12-31 DIAGNOSIS — K5903 Drug induced constipation: Secondary | ICD-10-CM | POA: Diagnosis not present

## 2020-12-31 DIAGNOSIS — Q8500 Neurofibromatosis, unspecified: Secondary | ICD-10-CM | POA: Diagnosis not present

## 2020-12-31 DIAGNOSIS — I10 Essential (primary) hypertension: Secondary | ICD-10-CM | POA: Diagnosis not present

## 2020-12-31 DIAGNOSIS — G43009 Migraine without aura, not intractable, without status migrainosus: Secondary | ICD-10-CM | POA: Diagnosis not present

## 2021-01-01 DIAGNOSIS — R39198 Other difficulties with micturition: Secondary | ICD-10-CM | POA: Diagnosis not present

## 2021-01-01 DIAGNOSIS — K5792 Diverticulitis of intestine, part unspecified, without perforation or abscess without bleeding: Secondary | ICD-10-CM | POA: Diagnosis not present

## 2021-01-01 DIAGNOSIS — Z6825 Body mass index (BMI) 25.0-25.9, adult: Secondary | ICD-10-CM | POA: Diagnosis not present

## 2021-01-16 DIAGNOSIS — R109 Unspecified abdominal pain: Secondary | ICD-10-CM | POA: Diagnosis not present

## 2021-01-16 DIAGNOSIS — E6609 Other obesity due to excess calories: Secondary | ICD-10-CM | POA: Diagnosis not present

## 2021-01-16 DIAGNOSIS — Z6827 Body mass index (BMI) 27.0-27.9, adult: Secondary | ICD-10-CM | POA: Diagnosis not present

## 2021-01-16 DIAGNOSIS — Q8500 Neurofibromatosis, unspecified: Secondary | ICD-10-CM | POA: Diagnosis not present

## 2021-01-23 ENCOUNTER — Encounter: Payer: Self-pay | Admitting: Nurse Practitioner

## 2021-02-07 ENCOUNTER — Other Ambulatory Visit: Payer: Self-pay

## 2021-02-08 ENCOUNTER — Encounter: Payer: Self-pay | Admitting: Nurse Practitioner

## 2021-02-08 ENCOUNTER — Ambulatory Visit (INDEPENDENT_AMBULATORY_CARE_PROVIDER_SITE_OTHER): Payer: Medicare HMO | Admitting: Nurse Practitioner

## 2021-02-08 VITALS — BP 124/90 | HR 95 | Ht 60.0 in | Wt 129.4 lb

## 2021-02-08 DIAGNOSIS — R933 Abnormal findings on diagnostic imaging of other parts of digestive tract: Secondary | ICD-10-CM

## 2021-02-08 DIAGNOSIS — R110 Nausea: Secondary | ICD-10-CM

## 2021-02-08 DIAGNOSIS — R1110 Vomiting, unspecified: Secondary | ICD-10-CM | POA: Diagnosis not present

## 2021-02-08 DIAGNOSIS — R1030 Lower abdominal pain, unspecified: Secondary | ICD-10-CM | POA: Diagnosis not present

## 2021-02-08 MED ORDER — METOCLOPRAMIDE HCL 10 MG PO TABS: ORAL_TABLET | ORAL | 0 refills | Status: DC

## 2021-02-08 NOTE — Progress Notes (Signed)
ASSESSMENT AND PLAN    # 60 yo female with generalized lower abdominal pain x 3 months. She had three CTAPs August - September without acute findings but then a 4th CTAP on 10/4 (for the same pain) suggested mild sigmoid colon wall thickening suspicious for diverticulitis. No improvement after two rounds of antibiotics. Not clear if her abdominal pain is even related to CTAP findings.  --She has never had a colonoscopy. Will arrange for colonoscopy to evaluate CTAP findings. The risks and benefits of colonoscopy with possible polypectomy / biopsies were discussed and the patient agrees to proceed.    # Chronic intermittent nausea / vomiting. Delayed gastric emptying related to opioids is very possible. However, she does take NSAIDs so need to rule out PUD.  -- We will arrange for upper endoscopy to be done at time of colonoscopy . The risks and benefits of EGD with possible biopsies were discussed with the patient who agrees to proceed.   --Recently started on Prilosec. 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   --Recommend PCP fractionate bilirubin the next time she gets labs.   HISTORY OF PRESENT ILLNESS     Chief Complaint : nausea, vomiting, lower abdominal pain   Julia [name] is a 60 y.o. female with a past medical history significant for anxiety, hyperlipidemia, fatty liver, HTN, neurofibromatosis syndrome , chronic low back pain , diverticulosis.  See PMH below for any additional history.   Patient is new to the practice, referred by PCP for evaluation of abdominal pain. She was previously followed by Dr Oneida Alar but discharged from her practice in March 2021 (no shows for appointments)   Patient has been seen several times this year in the ED for various issues including shortness of breath,   seizure-like activity, abdominal pain, nausea / vomiting.  She has no prior history of seizures but in late August was hospitalized with seizure like activity at home. She also complained of lower abdominal pain, nausea / vomiting. During that admission she was treated for electrolyte abnormalities. EEG showed no seizure activity. Head CT without contrast negative for acute abnormalities. CTAP for abdominal pain was unremarkable. She was not started on any anti-seizure medications or advised to have Neurology follow up. No recurrent seizure like symptoms. Of note, during that admission her T.bili was mildly elevated ( chronic, stable), AST minimally elevated at 49. Normal alk phos.  She had mild leukocytosis, hgb normal.   Patient returned to the ED twice in mid September for ongoing LLQ, nausea and vomiting. Repeat labs unremarkable except for stable / chronic elevation in total bilirubin.  Repeat CTAP w/ contrast on 11/18/20 and 11/21/20 were negative for acute findings .    She describes mid lower abdominal pain, mainly present in am. The pain usually gets better after eating breakfast and doesn't bother her again until she wakes up the next morning. .She describes the pain as burning, sometimes sharp. Sometimes the pain improves with defecation. Her bowel movements are described as being normal. She hasn't had any blood in her stool / rectal bleeding. No fevers. Patient says her PCP told her to discontinue Bentyl prescribed by ED since it wasn't helping  Julia [name] returned to the ED 12/11/20 for ongoing lower abdominal pain.  At that time CTAP showed very  mild thickening of the proximal sigmoid colon suspicious for mild diverticulitis. CBC normal.  She was given prescriptions for Cipro and Flagyl and discharged from the ED. She completed antibiotics prescribed by ED. She was treated by PCP with a second round of antibiotics. She hasn't had any improvement in the abdominal pain.   She has had intermittent  nausea / vomiting for years.  Her weight is stable. No blood in stool No urinary symptoms. She has never had a colonoscopy. No Jenera of colon cancer.  Patient is followed by Neurology in [place] for a calcified right convexity meningioma which was unchanged on head CT scan done during late August admission.    Patient denies any history of GERD or PUD. She was recently started on Prilosec but does not know why. She has had intermittent nausea / vomiting for years. Her weight is stable. She had never had an EGD.  She does take ibuprofen as needed.  Best guess is that she averages about 10 doses of ibuprofen a month . She require opioids for chronic pain and was recently prescribed tramadol for her abdominal pain.    Changes to home med list: She is not taking buspar, wellbutrin or bentyl so I am removing from list   PREVIOUS GI EVALUATIONS:   *Four CTAPs since August 2022. Only the most recent one had GI findings as below  12/11/20 CTAP w/ contrast EXAM: CT ABDOMEN AND PELVIS WITH CONTRAST   TECHNIQUE: Multidetector CT imaging of the abdomen and pelvis was performed using the standard protocol following bolus administration of intravenous contrast.   CONTRAST:  151m OMNIPAQUE IOHEXOL 300 MG/ML  SOLN   COMPARISON:  November 21, 2020   FINDINGS: Lower chest: No acute abnormality.   Hepatobiliary: Stable 6 mm and 8 mm foci of parenchymal low attenuation are seen along the liver dome. These areas are too small to characterize by CT examination.   Pancreas: Unremarkable. No pancreatic ductal dilatation or surrounding inflammatory changes.   Spleen: A stable 1.0 cm cystic appearing areas noted within the posterior aspect of the spleen.   Adrenals/Urinary Tract: Adrenal glands are unremarkable. Kidneys are normal in size, without renal calculi or hydronephrosis. A 5 mm cystic appearing areas noted within the anterolateral aspect of the mid right kidney. Bladder is unremarkable.    Stomach/Bowel: Stomach is within normal limits. Appendix appears normal. No evidence of bowel dilatation. Numerous diverticula are seen throughout the large bowel. Very mild thickening of the proximal sigmoid colon is seen.   Vascular/Lymphatic: No significant vascular findings are present. No enlarged abdominal or pelvic lymph nodes.   Reproductive: Uterus and bilateral adnexa are unremarkable.   Other: No abdominal wall hernia or abnormality. No abdominopelvic ascites.   Musculoskeletal: No acute or significant osseous findings.   IMPRESSION: 1. Colonic diverticulosis. 2. Very mild thickening of the proximal sigmoid colon which may represent mild diverticulitis. 3. Stable cystic appearing areas within the spleen and right kidney.    Past Medical History:  Diagnosis Date   Anxiety    Brain tumor (benign) (HCC)    Chronic pain    Elevated cholesterol    Headache(784.0)    Hypertension    Numerous moles    Seizures ([place]    Trichimoniasis      Past Surgical History:  Procedure Laterality Date   EXCISION OF SKIN TAG N/A 09/22/2017   Procedure: EXCISION OF SKIN TAGS ON FACE AND NECK (Procedure #2);  Surgeon: FJonnie Kind MD;  Location: [facility];  Service: Gynecology;  Laterality: N/A;   HYSTEROSCOPY WITH D & C N/A 09/22/2017   Procedure: DILATATION AND CURETTAGE /HYSTEROSCOPY; EXCISION OF VULVAR AND RIGHT AND LEFT THIGH SKIN TAGS (Procedure #1);  Surgeon: Jonnie Kind, MD;  Location: [facility];  Service: Gynecology;  Laterality: N/A;   POLYPECTOMY N/A 09/22/2017   Procedure: REMOVAL OF ENDOMETRIAL POLYP (Procedure #1);  Surgeon: Jonnie Kind, MD;  Location: [facility];  Service: Gynecology;  Laterality: N/A;   TUBAL LIGATION     Family History  Problem Relation Age of Onset   Stroke Mother    Heart failure Mother    Cancer Father        lung   Diabetes Brother    Social History   Tobacco Use   Smoking status: Never   Smokeless tobacco: Never  Vaping Use    Vaping Use: Never used  Substance Use Topics   Alcohol use: No   Drug use: No   Current Outpatient Medications  Medication Sig Dispense Refill   albuterol (PROVENTIL HFA;VENTOLIN HFA) 108 (90 Base) MCG/ACT inhaler      ALPRAZolam (XANAX) 1 MG tablet Take 1 mg by mouth 3 (three) times daily.     ezetimibe (ZETIA) 10 MG tablet Take 10 mg by mouth daily.     hydrochlorothiazide (MICROZIDE) 12.5 MG capsule Take 12.5 mg by mouth daily.     ibuprofen (ADVIL) 200 MG tablet Take 400 mg by mouth every 4 (four) hours as needed.     loperamide (IMODIUM) 2 MG capsule Take 2 mg by mouth 2 (two) times daily.     losartan (COZAAR) 50 MG tablet Take 50 mg by mouth daily.      omeprazole (PRILOSEC) 20 MG capsule Take 20 mg by mouth daily.     oxyCODONE-acetaminophen (PERCOCET/ROXICET) 5-325 MG tablet Take 1 tablet by mouth 2 (two) times daily as needed for pain.     potassium chloride SA (KLOR-CON) 20 MEQ tablet Take 20 mEq by mouth daily.     pravastatin (PRAVACHOL) 40 MG tablet Take 40 mg by mouth daily.     promethazine (PHENERGAN) 25 MG suppository Place 1 suppository (25 mg total) rectally every 6 (six) hours as needed for nausea or vomiting. 12 each 1   sertraline (ZOLOFT) 25 MG tablet 25 mg daily.     traMADol (ULTRAM) 50 MG tablet Take 50 mg by mouth every 6 (six) hours.     omeprazole (PRILOSEC) 20 MG capsule Take 1 capsule (20 mg total) by mouth daily. 30 capsule 0   No current facility-administered medications for this visit.   Allergies  Allergen Reactions   Naprosyn [Naproxen] Nausea Only   Penicillins Nausea And Vomiting and Other (See Comments)    Has patient had a PCN reaction causing immediate rash, facial/tongue/throat swelling, SOB or lightheadedness with hypotension: No Has patient had a PCN reaction causing severe rash involving mucus membranes or skin necrosis: No Has patient had a PCN reaction that required hospitalization Yes Has patient had a PCN reaction occurring within  the last 10 years: No If all of the above answers are "NO", then may proceed with Cephalosporin use.    Promethazine Hcl Nausea Only     Review of Systems: Positive for anxiety, back pain, depression, headaches, shortness of breath, excessive urination.  All other systems reviewed and negative except where noted in HPI.    PHYSICAL EXAM :    Wt Readings from Last 3 Encounters:  02/08/21 129 lb 6 oz (  58.7 kg)  12/11/20 136 lb (61.7 kg)  12/10/20 136 lb (61.7 kg)    BP 124/90   Pulse 95   Ht 5' (1.524 m)   Wt 129 lb 6 oz (58.7 kg)   LMP 09/06/2012   BMI 25.27 kg/m  Constitutional:  Thin female in no acute distress. Psychiatric: Pleasant. Normal mood and affect. Behavior is normal. EENT: Pupils normal.  Conjunctivae are normal. No scleral icterus. Neck supple.  Cardiovascular: Normal rate, regular rhythm. No edema Pulmonary/chest: Effort normal and breath sounds normal. No wheezing, rales or rhonchi. Abdominal: Soft, nondistended, nontender. Bowel sounds active throughout. There are no masses palpable. No hepatomegaly. Neurological: Alert and oriented to person place and time. Skin: Skin is warm and dry. No rashes noted.  Tye Savoy, NP  02/08/2021, 10:38 AM  Cc:  Referring Provider Redmond School, MD

## 2021-02-08 NOTE — Patient Instructions (Signed)
If you are age 61 or younger, your body mass index should be between 19-25. Your Body mass index is 25.27 kg/m. If this is out of the aformentioned range listed, please consider follow up with your Primary Care Provider.   The [facility] providers would like to encourage you to use [facility] to communicate with providers for non-urgent requests or questions.  Due to long hold times on the telephone, sending your provider a message by [facility] may be faster and more efficient way to get a response. Please allow 48 business hours for a response.  Please remember that this is for non-urgent requests/questions.  PROCEDURES: You have been scheduled for an EGD and Colonoscopy. Please follow the written instructions given to you at your visit today. If you use inhalers (even only as needed), please bring them with you on the day of your procedure.  It was great seeing you today! Thank you for entrusting me with your care and choosing [facility].  Tye Savoy, NP

## 2021-02-09 NOTE — Progress Notes (Signed)
I agree with the assessment and plan as outlined by Ms. Guenther. 

## 2021-02-20 ENCOUNTER — Encounter (HOSPITAL_COMMUNITY): Payer: Self-pay | Admitting: *Deleted

## 2021-02-20 ENCOUNTER — Emergency Department (HOSPITAL_COMMUNITY)
Admission: EM | Admit: 2021-02-20 | Discharge: 2021-02-20 | Disposition: A | Discharge status: Home / Self Care | Payer: Medicare HMO | Attending: Emergency Medicine | Admitting: Emergency Medicine

## 2021-02-20 ENCOUNTER — Other Ambulatory Visit: Payer: Self-pay

## 2021-02-20 DIAGNOSIS — R1030 Lower abdominal pain, unspecified: Secondary | ICD-10-CM | POA: Diagnosis not present

## 2021-02-20 DIAGNOSIS — R109 Unspecified abdominal pain: Secondary | ICD-10-CM | POA: Diagnosis present

## 2021-02-20 DIAGNOSIS — E876 Hypokalemia: Secondary | ICD-10-CM | POA: Diagnosis not present

## 2021-02-20 DIAGNOSIS — E871 Hypo-osmolality and hyponatremia: Secondary | ICD-10-CM | POA: Diagnosis not present

## 2021-02-20 DIAGNOSIS — Z79899 Other long term (current) drug therapy: Secondary | ICD-10-CM | POA: Diagnosis not present

## 2021-02-20 DIAGNOSIS — R1084 Generalized abdominal pain: Secondary | ICD-10-CM | POA: Diagnosis not present

## 2021-02-20 DIAGNOSIS — I10 Essential (primary) hypertension: Secondary | ICD-10-CM | POA: Insufficient documentation

## 2021-02-20 LAB — HEPATIC FUNCTION PANEL
ALT: 31 U/L (ref 0–44)
AST: 24 U/L (ref 15–41)
Albumin: 4 g/dL (ref 3.5–5.0)
Alkaline Phosphatase: 101 U/L (ref 38–126)
Bilirubin, Direct: 0.1 mg/dL (ref 0.0–0.2)
Indirect Bilirubin: 0.5 mg/dL (ref 0.3–0.9)
Total Bilirubin: 0.6 mg/dL (ref 0.3–1.2)
Total Protein: 7.1 g/dL (ref 6.5–8.1)

## 2021-02-20 LAB — CBC WITH DIFFERENTIAL/PLATELET
Abs Immature Granulocytes: 0.02 10*3/uL (ref 0.00–0.07)
Basophils Absolute: 0 10*3/uL (ref 0.0–0.1)
Basophils Relative: 1 %
Eosinophils Absolute: 0.1 10*3/uL (ref 0.0–0.5)
Eosinophils Relative: 2 %
HCT: 40.2 % (ref 36.0–46.0)
Hemoglobin: 13.9 g/dL (ref 12.0–15.0)
Immature Granulocytes: 0 %
Lymphocytes Relative: 28 %
Lymphs Abs: 1.7 10*3/uL (ref 0.7–4.0)
MCH: 30.4 pg (ref 26.0–34.0)
MCHC: 34.6 g/dL (ref 30.0–36.0)
MCV: 88 fL (ref 80.0–100.0)
Monocytes Absolute: 0.5 10*3/uL (ref 0.1–1.0)
Monocytes Relative: 9 %
Neutro Abs: 3.8 10*3/uL (ref 1.7–7.7)
Neutrophils Relative %: 60 %
Platelets: 249 10*3/uL (ref 150–400)
RBC: 4.57 MIL/uL (ref 3.87–5.11)
RDW: 13.1 % (ref 11.5–15.5)
WBC: 6.3 10*3/uL (ref 4.0–10.5)
nRBC: 0 % (ref 0.0–0.2)

## 2021-02-20 LAB — BASIC METABOLIC PANEL
Anion gap: 9 (ref 5–15)
BUN: 10 mg/dL (ref 8–23)
CO2: 20 mmol/L — ABNORMAL LOW (ref 22–32)
Calcium: 10.6 mg/dL — ABNORMAL HIGH (ref 8.9–10.3)
Chloride: 103 mmol/L (ref 98–111)
Creatinine, Ser: 0.77 mg/dL (ref 0.44–1.00)
GFR, Estimated: >60 mL/min (ref >60)
Glucose, Bld: 119 mg/dL — ABNORMAL HIGH (ref 70–99)
Potassium: 3.4 mmol/L — ABNORMAL LOW (ref 3.5–5.1)
Sodium: 132 mmol/L — ABNORMAL LOW (ref 135–145)

## 2021-02-20 NOTE — ED Triage Notes (Signed)
Pt with burning abd. Pain since September, dx with diverticulitis 12/10/20.  Has colonoscopy scheduled in Jan. Pt here to help relieve her pain. LBM this morning and normal per pt.  Emesis x 1 today and has medicine to help with her nausea.

## 2021-02-20 NOTE — ED Provider Notes (Signed)
[facility] EMERGENCY DEPARTMENT Provider Note   CSN: [id number] Arrival date & time: 02/20/21  1740     History Chief Complaint  Patient presents with   Abdominal Pain    Julia [name] is a 60 y.o. female.   Abdominal Pain Associated symptoms: nausea   Associated symptoms: no chest pain and no shortness of breath   Patient presents with abdominal pain.  Has had since September.  Has had 4 different CAT scans.  The most recent 1 which was back in October showed potential diverticulitis.  Has had 2 courses of antibiotics without relief.  Continued pain.  Has seen gastroenterology and is planned for colonoscopy.  Has continued pain.  Vomited once today.  Helpful in for more pain relief and to figure out how to control the pain.  Similar pain she has had.    Past Medical History:  Diagnosis Date   Anxiety    Brain tumor (benign) (HCC)    Chronic pain    Elevated cholesterol    Headache(784.0)    Hypertension    Numerous moles    Seizures ([place])    Trichimoniasis     Patient Active Problem List   Diagnosis Date Noted   Abdominal pain 11/05/2020   Nausea & vomiting 11/05/2020   Hypokalemia 11/05/2020   Lactic acidosis 11/05/2020   Leukocytosis 11/05/2020   Hyperglycemia 11/05/2020   Elevated AST (SGOT) 11/05/2020   Hyperlipidemia 11/05/2020   Anxiety 11/05/2020   Seizure ([place]) 11/04/2020   Long-term current use of opiate analgesic 11/04/2020   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   Chronic headache disorder 11/04/2020   Chronic low back pain 11/04/2020   Essential hypertension 11/04/2020   Migraine without aura 11/04/2020   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   Pain radiating to neck 11/04/2020   Neck pain 04/19/2020   Thickened endometrium 06/17/2017   Screening for colorectal cancer 06/09/2017   Encounter for gynecological examination with Papanicolaou smear of cervix 06/09/2017   PMB (postmenopausal bleeding) 06/09/2017   Drug-induced  constipation 09/30/2012   Colon cancer screening 09/30/2012   Trichomoniasis of vagina 08/31/2012   Pelvic relaxation due to cystocele, midline 06/02/2012   Rectocele 06/02/2012    Past Surgical History:  Procedure Laterality Date   EXCISION OF SKIN TAG N/A 09/22/2017   Procedure: EXCISION OF SKIN TAGS ON FACE AND NECK (Procedure #2);  Surgeon: Jonnie Kind, MD;  Location: [facility];  Service: Gynecology;  Laterality: N/A;   HYSTEROSCOPY WITH D & C N/A 09/22/2017   Procedure: DILATATION AND CURETTAGE /HYSTEROSCOPY; EXCISION OF VULVAR AND RIGHT AND LEFT THIGH SKIN TAGS (Procedure #1);  Surgeon: Jonnie Kind, MD;  Location: [facility];  Service: Gynecology;  Laterality: N/A;   POLYPECTOMY N/A 09/22/2017   Procedure: REMOVAL OF ENDOMETRIAL POLYP (Procedure #1);  Surgeon: Jonnie Kind, MD;  Location: [facility];  Service: Gynecology;  Laterality: N/A;   TUBAL LIGATION       OB History     Gravida  3   Para  3   Term      Preterm      AB      Living  3      SAB      IAB      Ectopic      Multiple      Live Births              Family History  Problem Relation Age of Onset   Stroke Mother  Heart failure Mother    Cancer Father        lung   Diabetes Brother     Social History   Tobacco Use   Smoking status: Never   Smokeless tobacco: Never  Vaping Use   Vaping Use: Never used  Substance Use Topics   Alcohol use: No   Drug use: No    Home Medications Prior to Admission medications   Medication Sig Start Date End Date Taking? Authorizing Provider  albuterol (PROVENTIL HFA;VENTOLIN HFA) 108 (90 Base) MCG/ACT inhaler Inhale 1-2 puffs into the lungs every 6 (six) hours as needed for shortness of breath. 09/18/17  Yes [provider]  ALPRAZolam Duanne Moron) 1 MG tablet Take 1 mg by mouth 3 (three) times daily.   Yes [provider]  buPROPion (WELLBUTRIN SR) 150 MG 12 hr tablet Take 150 mg by mouth 2 (two) times daily. 11/07/20  Yes  [provider]  ezetimibe (ZETIA) 10 MG tablet Take 10 mg by mouth daily.   Yes [provider]  hydrochlorothiazide (MICROZIDE) 12.5 MG capsule Take 12.5 mg by mouth daily. 05/28/20  Yes [provider]  ibuprofen (ADVIL) 200 MG tablet Take 400 mg by mouth every 4 (four) hours as needed.   Yes [provider]  loperamide (IMODIUM) 2 MG capsule Take 2 mg by mouth 2 (two) times daily. 11/05/20  Yes [provider]  losartan (COZAAR) 50 MG tablet Take 50 mg by mouth daily.  09/01/13  Yes [provider]  omeprazole (PRILOSEC) 20 MG capsule Take 20 mg by mouth daily.   Yes [provider]  potassium chloride SA (KLOR-CON) 20 MEQ tablet Take 20 mEq by mouth daily. 08/02/20  Yes [provider]  pravastatin (PRAVACHOL) 40 MG tablet Take 40 mg by mouth daily. 05/20/12  Yes [provider]  sertraline (ZOLOFT) 25 MG tablet Take 25 mg by mouth daily.   Yes [provider]  traMADol (ULTRAM) 50 MG tablet Take 50 mg by mouth every 6 (six) hours. 01/29/21  Yes [provider]  metoCLOPramide (REGLAN) 10 MG tablet Take 1 tablet 30 minutes prior to each bowel prep. Patient not taking: Reported on 02/20/2021 02/08/21   [name], NP  promethazine (PHENERGAN) 25 MG suppository Place 1 suppository (25 mg total) rectally every 6 (six) hours as needed for nausea or vomiting. Patient not taking: Reported on 02/20/2021 12/11/20   Milton Ferguson, MD    Allergies    Naprosyn [naproxen], Penicillins, and Promethazine hcl  Review of Systems   Review of Systems  Constitutional:  Negative for appetite change.  HENT:  Negative for congestion.   Respiratory:  Negative for shortness of breath.   Cardiovascular:  Negative for chest pain.  Gastrointestinal:  Positive for abdominal pain and nausea.  Genitourinary:  Negative for flank pain.  Skin:  Negative for rash.  Neurological:  Negative for weakness.   Psychiatric/Behavioral:  Negative for confusion.    Physical Exam Updated Vital Signs BP (!) 134/108    Pulse (!) 107    Temp 98 F (36.7 C) (Oral)    Ht 5' (1.524 m)    Wt 60.8 kg    LMP 09/06/2012    SpO2 99%    BMI 26.17 kg/m   Physical Exam Vitals and nursing note reviewed.  HENT:     Head: Normocephalic.  Eyes:     General: No scleral icterus. Cardiovascular:     Rate and Rhythm: Normal rate and regular  rhythm.  Pulmonary:     Breath sounds: Normal breath sounds.  Abdominal:     Hernia: No hernia is present.     Comments: Mild lower abdominal tenderness without rebound or guarding.  No hernia palpated.  Skin:    General: Skin is warm.     Capillary Refill: Capillary refill takes less than 2 seconds.  Neurological:     Mental Status: She is alert and oriented to person, place, and time.    ED Results / Procedures / Treatments   Labs (all labs ordered are listed, but only abnormal results are displayed) Labs Reviewed  BASIC METABOLIC PANEL - Abnormal; Notable for the following components:      Result Value   Sodium 132 (*)    Potassium 3.4 (*)    CO2 20 (*)    Glucose, Bld 119 (*)    Calcium 10.6 (*)    All other components within normal limits  HEPATIC FUNCTION PANEL  CBC WITH DIFFERENTIAL/PLATELET    EKG None  Radiology No results found.  Procedures Procedures   Medications Ordered in ED Medications - No data to display  ED Course  I have reviewed the triage vital signs and the nursing notes.  Pertinent labs & imaging results that were available during my care of the patient were reviewed by me and considered in my medical decision making (see chart for details).    MDM Rules/Calculators/A&P                           Patient presents with what is now become chronic abdominal pain.  Has had for around 4 months.  Has had CT scans without clear cause although one of them did show potentially diverticulitis.  Has had antibiotics without relief.   Has seen GI with plan of colonoscopy.  Reviewing lab work it is basically at baseline.  Mild hyponatremia.  Mild hypokalemia.  Calcium just mildly elevated.  Bilirubin had been elevated is now normalized.  Do not see acute intervention needed at this time.  We will continue medications.  Follow-up with GI and PCP Final Clinical Impression(s) / ED Diagnoses Final diagnoses:  Generalized abdominal pain    Rx / DC Orders ED Discharge Orders     None        Davonna Belling, MD 02/20/21 2003

## 2021-02-20 NOTE — Discharge Instructions (Signed)
Follow-up with gastroenterology.  Your lab work is similar before but the bilirubin has improved.

## 2021-02-22 ENCOUNTER — Telehealth: Payer: Self-pay | Admitting: Nurse Practitioner

## 2021-02-22 DIAGNOSIS — E663 Overweight: Secondary | ICD-10-CM | POA: Diagnosis not present

## 2021-02-22 DIAGNOSIS — I10 Essential (primary) hypertension: Secondary | ICD-10-CM | POA: Diagnosis not present

## 2021-02-22 DIAGNOSIS — R109 Unspecified abdominal pain: Secondary | ICD-10-CM | POA: Diagnosis not present

## 2021-02-22 DIAGNOSIS — D510 Vitamin B12 deficiency anemia due to intrinsic factor deficiency: Secondary | ICD-10-CM | POA: Diagnosis not present

## 2021-02-22 DIAGNOSIS — G894 Chronic pain syndrome: Secondary | ICD-10-CM | POA: Diagnosis not present

## 2021-02-22 DIAGNOSIS — Z6824 Body mass index (BMI) 24.0-24.9, adult: Secondary | ICD-10-CM | POA: Diagnosis not present

## 2021-02-22 NOTE — Telephone Encounter (Signed)
See next entry

## 2021-02-22 NOTE — Telephone Encounter (Signed)
Spoke with Randell Patient, daughter of the patient. She calls with concerns about her mother and the complaints of abdominal pain. "Every morning when I wake her up she says her stomach hurts. Sometimes she cannot stand up straight because of the pain." She reports multiple doctors visits and ER visits. Most recently she has been given Dicyclomine. She had thought this was an antibiotic. Explained the medication to her. She will have her mother try this and hopefully it will give her some relief. Expresses frustration about the situation. No new symptoms.

## 2021-02-22 NOTE — Telephone Encounter (Signed)
Inbound call from daughter. States patient is in unbearable abd pain, can't sleep, and burning sensation in stomach. Says she feels horrible and need to know what can help. Patient have endo/colon with Dr. Lorenso Courier 03/19/21. Best contact number [phone]

## 2021-02-26 DIAGNOSIS — D329 Benign neoplasm of meninges, unspecified: Secondary | ICD-10-CM | POA: Diagnosis not present

## 2021-02-26 DIAGNOSIS — M542 Cervicalgia: Secondary | ICD-10-CM | POA: Diagnosis not present

## 2021-02-26 DIAGNOSIS — K5903 Drug induced constipation: Secondary | ICD-10-CM | POA: Diagnosis not present

## 2021-02-26 DIAGNOSIS — G4489 Other headache syndrome: Secondary | ICD-10-CM | POA: Diagnosis not present

## 2021-02-26 DIAGNOSIS — G43009 Migraine without aura, not intractable, without status migrainosus: Secondary | ICD-10-CM | POA: Diagnosis not present

## 2021-02-26 DIAGNOSIS — Q8500 Neurofibromatosis, unspecified: Secondary | ICD-10-CM | POA: Diagnosis not present

## 2021-02-26 DIAGNOSIS — I10 Essential (primary) hypertension: Secondary | ICD-10-CM | POA: Diagnosis not present

## 2021-02-26 DIAGNOSIS — M545 Low back pain, unspecified: Secondary | ICD-10-CM | POA: Diagnosis not present

## 2021-02-26 DIAGNOSIS — Z79891 Long term (current) use of opiate analgesic: Secondary | ICD-10-CM | POA: Diagnosis not present

## 2021-03-06 ENCOUNTER — Emergency Department (HOSPITAL_COMMUNITY)
Admission: EM | Admit: 2021-03-06 | Discharge: 2021-03-06 | Disposition: A | Discharge status: Home / Self Care | Payer: Medicare HMO | Attending: Emergency Medicine | Admitting: Emergency Medicine

## 2021-03-06 ENCOUNTER — Other Ambulatory Visit: Payer: Self-pay

## 2021-03-06 ENCOUNTER — Encounter (HOSPITAL_COMMUNITY): Payer: Self-pay | Admitting: Emergency Medicine

## 2021-03-06 DIAGNOSIS — Z79899 Other long term (current) drug therapy: Secondary | ICD-10-CM | POA: Diagnosis not present

## 2021-03-06 DIAGNOSIS — R1030 Lower abdominal pain, unspecified: Secondary | ICD-10-CM | POA: Insufficient documentation

## 2021-03-06 DIAGNOSIS — I10 Essential (primary) hypertension: Secondary | ICD-10-CM | POA: Diagnosis not present

## 2021-03-06 LAB — COMPREHENSIVE METABOLIC PANEL
ALT: 25 U/L (ref 0–44)
AST: 20 U/L (ref 15–41)
Albumin: 4.7 g/dL (ref 3.5–5.0)
Alkaline Phosphatase: 103 U/L (ref 38–126)
Anion gap: 7 (ref 5–15)
BUN: 11 mg/dL (ref 8–23)
CO2: 25 mmol/L (ref 22–32)
Calcium: 11.4 mg/dL — ABNORMAL HIGH (ref 8.9–10.3)
Chloride: 101 mmol/L (ref 98–111)
Creatinine, Ser: 0.68 mg/dL (ref 0.44–1.00)
GFR, Estimated: >60 mL/min (ref >60)
Glucose, Bld: 108 mg/dL — ABNORMAL HIGH (ref 70–99)
Potassium: 3.5 mmol/L (ref 3.5–5.1)
Sodium: 133 mmol/L — ABNORMAL LOW (ref 135–145)
Total Bilirubin: 1.5 mg/dL — ABNORMAL HIGH (ref 0.3–1.2)
Total Protein: 7.6 g/dL (ref 6.5–8.1)

## 2021-03-06 LAB — URINALYSIS, ROUTINE W REFLEX MICROSCOPIC
Bilirubin Urine: NEGATIVE
Glucose, UA: NEGATIVE mg/dL
Hgb urine dipstick: NEGATIVE
Ketones, ur: NEGATIVE mg/dL
Leukocytes,Ua: NEGATIVE
Nitrite: NEGATIVE
Protein, ur: NEGATIVE mg/dL
Specific Gravity, Urine: 1.009 (ref 1.005–1.030)
pH: 8 (ref 5.0–8.0)

## 2021-03-06 LAB — CBC WITH DIFFERENTIAL/PLATELET
Abs Immature Granulocytes: 0.01 10*3/uL (ref 0.00–0.07)
Basophils Absolute: 0 10*3/uL (ref 0.0–0.1)
Basophils Relative: 0 %
Eosinophils Absolute: 0 10*3/uL (ref 0.0–0.5)
Eosinophils Relative: 0 %
HCT: 42.9 % (ref 36.0–46.0)
Hemoglobin: 14.7 g/dL (ref 12.0–15.0)
Immature Granulocytes: 0 %
Lymphocytes Relative: 23 %
Lymphs Abs: 1.8 10*3/uL (ref 0.7–4.0)
MCH: 29.9 pg (ref 26.0–34.0)
MCHC: 34.3 g/dL (ref 30.0–36.0)
MCV: 87.4 fL (ref 80.0–100.0)
Monocytes Absolute: 0.5 10*3/uL (ref 0.1–1.0)
Monocytes Relative: 7 %
Neutro Abs: 5.4 10*3/uL (ref 1.7–7.7)
Neutrophils Relative %: 70 %
Platelets: 253 10*3/uL (ref 150–400)
RBC: 4.91 MIL/uL (ref 3.87–5.11)
RDW: 13 % (ref 11.5–15.5)
WBC: 7.8 10*3/uL (ref 4.0–10.5)
nRBC: 0 % (ref 0.0–0.2)

## 2021-03-06 LAB — LIPASE, BLOOD: Lipase: 38 U/L (ref 11–51)

## 2021-03-06 MED ORDER — CAPSAICIN 0.075 % EX CREA
1.0000 | TOPICAL_CREAM | Freq: Two times a day (BID) | CUTANEOUS | 0 refills | Status: DC
Start: 2021-03-06 — End: 2021-04-23

## 2021-03-06 MED ORDER — CAPSAICIN 0.025 % EX CREA
TOPICAL_CREAM | Freq: Two times a day (BID) | CUTANEOUS | Status: DC
  Filled 2021-03-06: qty 60

## 2021-03-06 MED ORDER — CAPSAICIN 0.075 % EX CREA
TOPICAL_CREAM | Freq: Two times a day (BID) | CUTANEOUS | Status: DC
  Filled 2021-03-06: qty 60

## 2021-03-06 MED ORDER — OXYCODONE-ACETAMINOPHEN 5-325 MG PO TABS
1.0000 | ORAL_TABLET | Freq: Once | ORAL | Status: AC
  Administered 2021-03-06: 15:00:00 1 via ORAL
  Filled 2021-03-06: qty 1

## 2021-03-06 NOTE — Discharge Instructions (Signed)
1.  Try Zostrix cream on your abdomen.  You may use it 2-4 times a day if it is helpful.  You may try warm baths and hot showers. 2.  You are regular prescribed Percocet for pain control.  Use it sparingly but if needed for pain.  Be sure to take a stool softener regularly when you are taking Percocet so you do not get constipated.

## 2021-03-06 NOTE — ED Notes (Signed)
Dc instructions and scripts reviewed with pt. No questions or concerns at this time. Will follow up with pcp in 3 days.

## 2021-03-06 NOTE — ED Provider Notes (Signed)
[facility] DEPT Provider Note   CSN: [id number] Arrival date & time: 03/06/21  1046     History Chief Complaint  Patient presents with   Abdominal Pain    Julia [name] is a 60 y.o. female.  HPI Patient has had persistent and ongoing abdominal pain for months.  She has had several CT scans without definitive diagnosis.  Pain is cramping and burning in her lower abdomen.  She reports is present most of the time.  She denies fever, vomiting or diarrhea.  Patient or sometimes she has constipation.  She does not notice worsening of pain with eating.  She gets mild relief from taking her regularly prescribed oxycodone.  She was recently prescribed Bentyl but no improvement with this.  Patient is awaiting a colonoscopy in early January for additional diagnostic information.    Past Medical History:  Diagnosis Date   Anxiety    Brain tumor (benign) (HCC)    Chronic pain    Elevated cholesterol    Headache(784.0)    Hypertension    Numerous moles    Seizures ([place])    Trichimoniasis     Patient Active Problem List   Diagnosis Date Noted   Abdominal pain 11/05/2020   Nausea & vomiting 11/05/2020   Hypokalemia 11/05/2020   Lactic acidosis 11/05/2020   Leukocytosis 11/05/2020   Hyperglycemia 11/05/2020   Elevated AST (SGOT) 11/05/2020   Hyperlipidemia 11/05/2020   Anxiety 11/05/2020   Seizure ([place]) 11/04/2020   Long-term current use of opiate analgesic 11/04/2020   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   Chronic headache disorder 11/04/2020   Chronic low back pain 11/04/2020   Essential hypertension 11/04/2020   Migraine without aura 11/04/2020   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   Pain radiating to neck 11/04/2020   Neck pain 04/19/2020   Thickened endometrium 06/17/2017   Screening for colorectal cancer 06/09/2017   Encounter for gynecological examination with Papanicolaou smear of cervix 06/09/2017   PMB  (postmenopausal bleeding) 06/09/2017   Drug-induced constipation 09/30/2012   Colon cancer screening 09/30/2012   Trichomoniasis of vagina 08/31/2012   Pelvic relaxation due to cystocele, midline 06/02/2012   Rectocele 06/02/2012    Past Surgical History:  Procedure Laterality Date   EXCISION OF SKIN TAG N/A 09/22/2017   Procedure: EXCISION OF SKIN TAGS ON FACE AND NECK (Procedure #2);  Surgeon: Jonnie Kind, MD;  Location: [facility];  Service: Gynecology;  Laterality: N/A;   HYSTEROSCOPY WITH D & C N/A 09/22/2017   Procedure: DILATATION AND CURETTAGE /HYSTEROSCOPY; EXCISION OF VULVAR AND RIGHT AND LEFT THIGH SKIN TAGS (Procedure #1);  Surgeon: Jonnie Kind, MD;  Location: [facility];  Service: Gynecology;  Laterality: N/A;   POLYPECTOMY N/A 09/22/2017   Procedure: REMOVAL OF ENDOMETRIAL POLYP (Procedure #1);  Surgeon: Jonnie Kind, MD;  Location: [facility];  Service: Gynecology;  Laterality: N/A;   TUBAL LIGATION       OB History     Gravida  3   Para  3   Term      Preterm      AB      Living  3      SAB      IAB      Ectopic      Multiple      Live Births              Family History  Problem Relation Age of Onset   Stroke Mother  Heart failure Mother    Cancer Father        lung   Diabetes Brother     Social History   Tobacco Use   Smoking status: Never   Smokeless tobacco: Never  Vaping Use   Vaping Use: Never used  Substance Use Topics   Alcohol use: No   Drug use: No    Home Medications Prior to Admission medications   Medication Sig Start Date End Date Taking? Authorizing Provider  capsicum (ZOSTRIX) 0.075 % topical cream Apply 1 application topically 2 (two) times daily. 03/06/21  Yes Charlesetta Shanks, MD  albuterol (PROVENTIL HFA;VENTOLIN HFA) 108 (90 Base) MCG/ACT inhaler Inhale 1-2 puffs into the lungs every 6 (six) hours as needed for shortness of breath. 09/18/17   [provider]  ALPRAZolam Duanne Moron) 1 MG tablet Take  1 mg by mouth 3 (three) times daily.    [provider]  buPROPion (WELLBUTRIN SR) 150 MG 12 hr tablet Take 150 mg by mouth 2 (two) times daily. 11/07/20   [provider]  ezetimibe (ZETIA) 10 MG tablet Take 10 mg by mouth daily.    [provider]  hydrochlorothiazide (MICROZIDE) 12.5 MG capsule Take 12.5 mg by mouth daily. 05/28/20   [provider]  ibuprofen (ADVIL) 200 MG tablet Take 400 mg by mouth every 4 (four) hours as needed.    [provider]  loperamide (IMODIUM) 2 MG capsule Take 2 mg by mouth 2 (two) times daily. 11/05/20   [provider]  losartan (COZAAR) 50 MG tablet Take 50 mg by mouth daily.  09/01/13   [provider]  metoCLOPramide (REGLAN) 10 MG tablet Take 1 tablet 30 minutes prior to each bowel prep. Patient not taking: Reported on 02/20/2021 02/08/21   [name], NP  omeprazole (PRILOSEC) 20 MG capsule Take 20 mg by mouth daily.    [provider]  potassium chloride SA (KLOR-CON) 20 MEQ tablet Take 20 mEq by mouth daily. 08/02/20   [provider]  pravastatin (PRAVACHOL) 40 MG tablet Take 40 mg by mouth daily. 05/20/12   [provider]  promethazine (PHENERGAN) 25 MG suppository Place 1 suppository (25 mg total) rectally every 6 (six) hours as needed for nausea or vomiting. Patient not taking: Reported on 02/20/2021 12/11/20   Milton Ferguson, MD  sertraline (ZOLOFT) 25 MG tablet Take 25 mg by mouth daily.    [provider]  traMADol (ULTRAM) 50 MG tablet Take 50 mg by mouth every 6 (six) hours. 01/29/21   [provider]    Allergies    Naprosyn [naproxen], Penicillins, and Promethazine hcl  Review of Systems   Review of Systems 10 systems reviewed negative except as per HPI Physical Exam Updated Vital Signs BP (!) 141/97 (BP Location: Left Arm)    Pulse 99    Temp 97.8 F (36.6 C) (Oral)    Resp 16    Ht 5' (1.524 m)    Wt 61 kg    LMP 09/06/2012     SpO2 98%    BMI 26.26 kg/m   Physical Exam Constitutional:      Appearance: She is well-developed.     Comments: Patient is nontoxic.  She appears uncomfortable.  HENT:     Mouth/Throat:     Pharynx: Oropharynx is clear.  Eyes:     Extraocular Movements: Extraocular movements intact.  Cardiovascular:     Rate and Rhythm: Normal rate and regular rhythm.  Pulmonary:  Effort: Pulmonary effort is normal.     Breath sounds: Normal breath sounds.  Abdominal:     Comments: Abdomen soft.  No guarding.  Mild diffuse discomfort lower abdomen.  Musculoskeletal:        General: Normal range of motion.  Skin:    General: Skin is warm and dry.  Neurological:     General: No focal deficit present.     Mental Status: She is alert and oriented to person, place, and time.     Coordination: Coordination normal.  Psychiatric:        Mood and Affect: Mood normal.    ED Results / Procedures / Treatments   Labs (all labs ordered are listed, but only abnormal results are displayed) Labs Reviewed  COMPREHENSIVE METABOLIC PANEL - Abnormal; Notable for the following components:      Result Value   Sodium 133 (*)    Glucose, Bld 108 (*)    Calcium 11.4 (*)    Total Bilirubin 1.5 (*)    All other components within normal limits  URINALYSIS, ROUTINE W REFLEX MICROSCOPIC - Abnormal; Notable for the following components:   Color, Urine STRAW (*)    All other components within normal limits  LIPASE, BLOOD  CBC WITH DIFFERENTIAL/PLATELET    EKG None  Radiology No results found.  Procedures Procedures   Medications Ordered in ED Medications  capsaicin (ZOSTRIX) 0.025 % cream ( Topical Given 03/06/21 1417)  oxyCODONE-acetaminophen (PERCOCET/ROXICET) 5-325 MG per tablet 1 tablet (1 tablet Oral Given 03/06/21 1447)    ED Course  I have reviewed the triage vital signs and the nursing notes.  Pertinent labs & imaging results that were available during my care of the patient were  reviewed by me and considered in my medical decision making (see chart for details).    MDM Rules/Calculators/A&P                         Patient presents as outlined.  She has had difficulty with what is now chronic abdominal pain.  Patient has had 4 CT scans this year without significant acute findings.  Patient has been seen by gastroenterology without definitive diagnosis.  She has a upper and lower endoscopy coming up within about the next 2 weeks.  Patient has been tried on various medications and most recently given a prescription for Bentyl with no relief.  Basic labs do not show any change from previous.  Exam does not suggest acute surgical abdomen.  Patient has had multiple CTs without acute findings.  At this time I did try Zostrix cream to see if this gave the patient any relief.  She does report some improvement with hot showers.  At this time my recommendation is Zostrix if helpful, warm soaks and she continue to use her routinely prescribed oxycodone for chronic pain to alleviate some discomfort.  Patient highly encouraged to take stool softeners and avoid constipation.   Final Clinical Impression(s) / ED Diagnoses Final diagnoses:  Lower abdominal pain    Rx / DC Orders ED Discharge Orders          Ordered    capsicum (ZOSTRIX) 0.075 % topical cream  2 times daily        03/06/21 1509             Charlesetta Shanks, MD 03/06/21 1515

## 2021-03-06 NOTE — ED Notes (Signed)
EDP in room to assess at this time  

## 2021-03-06 NOTE — ED Triage Notes (Signed)
Complains of lower abdominal pain for months, has been to PCP and GI doctor. Supposed to have a colonoscopy and endoscopy, has yet to schedule. Pt reports burning in her abdomen. Pt states she needs something for pain. Denies N/V/D or fevers.

## 2021-03-08 ENCOUNTER — Other Ambulatory Visit: Payer: Self-pay

## 2021-03-08 ENCOUNTER — Emergency Department (HOSPITAL_COMMUNITY)
Admission: EM | Admit: 2021-03-08 | Discharge: 2021-03-08 | Disposition: A | Discharge status: Home / Self Care | Payer: Medicare HMO | Attending: Emergency Medicine | Admitting: Emergency Medicine

## 2021-03-08 ENCOUNTER — Encounter (HOSPITAL_COMMUNITY): Payer: Self-pay

## 2021-03-08 ENCOUNTER — Emergency Department (HOSPITAL_COMMUNITY): Payer: Medicare HMO

## 2021-03-08 DIAGNOSIS — E782 Mixed hyperlipidemia: Secondary | ICD-10-CM | POA: Diagnosis not present

## 2021-03-08 DIAGNOSIS — K7689 Other specified diseases of liver: Secondary | ICD-10-CM | POA: Diagnosis not present

## 2021-03-08 DIAGNOSIS — S29001A Unspecified injury of muscle and tendon of front wall of thorax, initial encounter: Secondary | ICD-10-CM | POA: Diagnosis present

## 2021-03-08 DIAGNOSIS — I10 Essential (primary) hypertension: Secondary | ICD-10-CM | POA: Insufficient documentation

## 2021-03-08 DIAGNOSIS — D4412 Neoplasm of uncertain behavior of left adrenal gland: Secondary | ICD-10-CM | POA: Diagnosis not present

## 2021-03-08 DIAGNOSIS — K5790 Diverticulosis of intestine, part unspecified, without perforation or abscess without bleeding: Secondary | ICD-10-CM | POA: Diagnosis not present

## 2021-03-08 DIAGNOSIS — Z86011 Personal history of benign neoplasm of the brain: Secondary | ICD-10-CM | POA: Diagnosis not present

## 2021-03-08 DIAGNOSIS — F4542 Pain disorder with related psychological factors: Secondary | ICD-10-CM | POA: Diagnosis not present

## 2021-03-08 DIAGNOSIS — S20211A Contusion of right front wall of thorax, initial encounter: Secondary | ICD-10-CM | POA: Insufficient documentation

## 2021-03-08 DIAGNOSIS — Z79899 Other long term (current) drug therapy: Secondary | ICD-10-CM | POA: Insufficient documentation

## 2021-03-08 DIAGNOSIS — G894 Chronic pain syndrome: Secondary | ICD-10-CM | POA: Diagnosis not present

## 2021-03-08 DIAGNOSIS — M5136 Other intervertebral disc degeneration, lumbar region: Secondary | ICD-10-CM | POA: Diagnosis not present

## 2021-03-08 DIAGNOSIS — I700 Atherosclerosis of aorta: Secondary | ICD-10-CM | POA: Diagnosis not present

## 2021-03-08 DIAGNOSIS — M25561 Pain in right knee: Secondary | ICD-10-CM | POA: Insufficient documentation

## 2021-03-08 DIAGNOSIS — S3991XA Unspecified injury of abdomen, initial encounter: Secondary | ICD-10-CM | POA: Diagnosis not present

## 2021-03-08 DIAGNOSIS — R109 Unspecified abdominal pain: Secondary | ICD-10-CM | POA: Diagnosis present

## 2021-03-08 DIAGNOSIS — R0789 Other chest pain: Secondary | ICD-10-CM | POA: Diagnosis not present

## 2021-03-08 DIAGNOSIS — Y92410 Unspecified street and highway as the place of occurrence of the external cause: Secondary | ICD-10-CM | POA: Insufficient documentation

## 2021-03-08 DIAGNOSIS — R9431 Abnormal electrocardiogram [ECG] [EKG]: Secondary | ICD-10-CM | POA: Diagnosis not present

## 2021-03-08 DIAGNOSIS — S2001XA Contusion of right breast, initial encounter: Secondary | ICD-10-CM | POA: Diagnosis not present

## 2021-03-08 DIAGNOSIS — R079 Chest pain, unspecified: Secondary | ICD-10-CM | POA: Diagnosis not present

## 2021-03-08 DIAGNOSIS — M5137 Other intervertebral disc degeneration, lumbosacral region: Secondary | ICD-10-CM | POA: Diagnosis not present

## 2021-03-08 DIAGNOSIS — D7389 Other diseases of spleen: Secondary | ICD-10-CM | POA: Diagnosis not present

## 2021-03-08 LAB — CBC WITH DIFFERENTIAL/PLATELET
Abs Immature Granulocytes: 0.05 10*3/uL (ref 0.00–0.07)
Basophils Absolute: 0 10*3/uL (ref 0.0–0.1)
Basophils Relative: 0 %
Eosinophils Absolute: 0 10*3/uL (ref 0.0–0.5)
Eosinophils Relative: 0 %
HCT: 42.2 % (ref 36.0–46.0)
Hemoglobin: 14.3 g/dL (ref 12.0–15.0)
Immature Granulocytes: 1 %
Lymphocytes Relative: 13 %
Lymphs Abs: 1.3 10*3/uL (ref 0.7–4.0)
MCH: 29.7 pg (ref 26.0–34.0)
MCHC: 33.9 g/dL (ref 30.0–36.0)
MCV: 87.7 fL (ref 80.0–100.0)
Monocytes Absolute: 0.6 10*3/uL (ref 0.1–1.0)
Monocytes Relative: 6 %
Neutro Abs: 7.9 10*3/uL — ABNORMAL HIGH (ref 1.7–7.7)
Neutrophils Relative %: 80 %
Platelets: 235 10*3/uL (ref 150–400)
RBC: 4.81 MIL/uL (ref 3.87–5.11)
RDW: 13 % (ref 11.5–15.5)
WBC: 9.9 10*3/uL (ref 4.0–10.5)
nRBC: 0 % (ref 0.0–0.2)

## 2021-03-08 LAB — COMPREHENSIVE METABOLIC PANEL
ALT: 24 U/L (ref 0–44)
AST: 23 U/L (ref 15–41)
Albumin: 4.2 g/dL (ref 3.5–5.0)
Alkaline Phosphatase: 98 U/L (ref 38–126)
Anion gap: 8 (ref 5–15)
BUN: 11 mg/dL (ref 8–23)
CO2: 24 mmol/L (ref 22–32)
Calcium: 11.1 mg/dL — ABNORMAL HIGH (ref 8.9–10.3)
Chloride: 99 mmol/L (ref 98–111)
Creatinine, Ser: 0.81 mg/dL (ref 0.44–1.00)
GFR, Estimated: >60 mL/min (ref >60)
Glucose, Bld: 106 mg/dL — ABNORMAL HIGH (ref 70–99)
Potassium: 3.8 mmol/L (ref 3.5–5.1)
Sodium: 131 mmol/L — ABNORMAL LOW (ref 135–145)
Total Bilirubin: 1.6 mg/dL — ABNORMAL HIGH (ref 0.3–1.2)
Total Protein: 7.1 g/dL (ref 6.5–8.1)

## 2021-03-08 MED ORDER — SODIUM CHLORIDE 0.9 % IV BOLUS
500.0000 mL | Freq: Once | INTRAVENOUS | Status: AC
  Administered 2021-03-08: 14:00:00 500 mL via INTRAVENOUS

## 2021-03-08 MED ORDER — LIDOCAINE 5 % EX PTCH
1.0000 | MEDICATED_PATCH | CUTANEOUS | 0 refills | Status: DC
Start: 2021-03-08 — End: 2021-04-23

## 2021-03-08 MED ORDER — METHOCARBAMOL 500 MG PO TABS: 500.0000 mg | ORAL_TABLET | Freq: Two times a day (BID) | ORAL | 0 refills | Status: DC

## 2021-03-08 MED ORDER — ONDANSETRON HCL 4 MG/2ML IJ SOLN
4.0000 mg | Freq: Once | INTRAMUSCULAR | Status: AC
  Administered 2021-03-08: 14:00:00 4 mg via INTRAVENOUS
  Filled 2021-03-08: qty 2

## 2021-03-08 MED ORDER — IOHEXOL 300 MG/ML  SOLN
100.0000 mL | Freq: Once | INTRAMUSCULAR | Status: AC | PRN
  Administered 2021-03-08: 15:00:00 100 mL via INTRAVENOUS

## 2021-03-08 MED ORDER — MORPHINE SULFATE (PF) 4 MG/ML IV SOLN: 4.0000 mg | Freq: Once | INTRAVENOUS | Status: DC

## 2021-03-08 MED ORDER — HYDROCODONE-ACETAMINOPHEN 5-325 MG PO TABS
1.0000 | ORAL_TABLET | Freq: Once | ORAL | Status: AC
  Administered 2021-03-08: 14:00:00 1 via ORAL
  Filled 2021-03-08: qty 1

## 2021-03-08 NOTE — Discharge Instructions (Signed)
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
## 2021-03-19 ENCOUNTER — Encounter: Payer: Self-pay | Admitting: Internal Medicine

## 2021-03-19 ENCOUNTER — Other Ambulatory Visit: Payer: Self-pay

## 2021-03-19 ENCOUNTER — Ambulatory Visit (AMBULATORY_SURGERY_CENTER): Payer: Medicare HMO | Admitting: Internal Medicine

## 2021-03-19 VITALS — BP 125/76 | HR 63 | Temp 97.8°F | Resp 22 | Ht 60.0 in | Wt 129.0 lb

## 2021-03-19 DIAGNOSIS — K648 Other hemorrhoids: Secondary | ICD-10-CM

## 2021-03-19 DIAGNOSIS — F419 Anxiety disorder, unspecified: Secondary | ICD-10-CM | POA: Diagnosis not present

## 2021-03-19 DIAGNOSIS — D123 Benign neoplasm of transverse colon: Secondary | ICD-10-CM

## 2021-03-19 DIAGNOSIS — K229 Disease of esophagus, unspecified: Secondary | ICD-10-CM | POA: Diagnosis not present

## 2021-03-19 DIAGNOSIS — K2970 Gastritis, unspecified, without bleeding: Secondary | ICD-10-CM | POA: Diagnosis not present

## 2021-03-19 DIAGNOSIS — I10 Essential (primary) hypertension: Secondary | ICD-10-CM | POA: Diagnosis not present

## 2021-03-19 DIAGNOSIS — R110 Nausea: Secondary | ICD-10-CM | POA: Diagnosis not present

## 2021-03-19 DIAGNOSIS — K2950 Unspecified chronic gastritis without bleeding: Secondary | ICD-10-CM | POA: Diagnosis not present

## 2021-03-19 DIAGNOSIS — F329 Major depressive disorder, single episode, unspecified: Secondary | ICD-10-CM | POA: Diagnosis not present

## 2021-03-19 DIAGNOSIS — K5730 Diverticulosis of large intestine without perforation or abscess without bleeding: Secondary | ICD-10-CM | POA: Diagnosis not present

## 2021-03-19 DIAGNOSIS — R933 Abnormal findings on diagnostic imaging of other parts of digestive tract: Secondary | ICD-10-CM

## 2021-03-19 MED ORDER — SODIUM CHLORIDE 0.9 % IV SOLN: 500.0000 mL | Freq: Once | INTRAVENOUS | Status: DC

## 2021-03-19 NOTE — Op Note (Signed)
Julia [name] Procedure Date: 03/19/2021 9:16 AM MRN: [id number] Endoscopist: Sonny Masters "Julia [name] ,  Age: 61 Referring MD:  Date of Birth: 07/31/1960 Gender: Female Account #: [id number] Procedure:                Colonoscopy Indications:              Abnormal CT of the GI tract Medicines:                Monitored Anesthesia Care Procedure:                Pre-Anesthesia Assessment:                           - Prior to the procedure, a History and Physical                            was performed, and patient medications and                            allergies were reviewed. The patient's tolerance of                            previous anesthesia was also reviewed. The risks                            and benefits of the procedure and the sedation                            options and risks were discussed with the patient.                            All questions were answered, and informed consent                            was obtained. Prior Anticoagulants: The patient has                            taken no previous anticoagulant or antiplatelet                            agents. ASA Grade Assessment: II - A patient with                            mild systemic disease. After reviewing the risks                            and benefits, the patient was deemed in                            satisfactory condition to undergo the procedure.                           After obtaining informed consent, the colonoscope  was passed under direct vision. Throughout the                            procedure, the patient's blood pressure, pulse, and                            oxygen saturations were monitored continuously. The                            Olympus CF-HQ190L [id number]) Colonoscope was                            introduced through the anus and advanced to the the                            terminal ileum. The  colonoscopy was performed                            without difficulty. The patient tolerated the                            procedure well. The quality of the bowel                            preparation was adequate. The terminal ileum,                            ileocecal valve, appendiceal orifice, and rectum                            were photographed. Scope In: 9:31:15 AM Scope Out: 9:49:21 AM Scope Withdrawal Time: 0 hours 15 minutes 53 seconds  Total Procedure Duration: 0 hours 18 minutes 6 seconds  Findings:                 The terminal ileum appeared normal.                           Multiple small and large-mouthed diverticula were                            found in the sigmoid colon, descending colon,                            transverse colon, ascending colon and cecum.                           Four sessile polyps were found in the transverse                            colon. The polyps were 3 to 10 mm in size. These                            polyps were removed with a cold snare. Resection  and retrieval were complete.                           Non-bleeding internal hemorrhoids were found during                            retroflexion. Complications:            No immediate complications. Estimated Blood Loss:     Estimated blood loss was minimal. Impression:               - The examined portion of the ileum was normal.                           - Diverticulosis in the sigmoid colon, in the                            descending colon, in the transverse colon, in the                            ascending colon and in the cecum.                           - Four 3 to 10 mm polyps in the transverse colon,                            removed with a cold snare. Resected and retrieved.                           - Non-bleeding internal hemorrhoids. Recommendation:           - Discharge patient to home (with escort).                           - Await  pathology results.                           - Return to GI clinic in 1 month.                           - The findings and recommendations were discussed                            with the patient. Sonny Masters "Julia [name],  03/19/2021 10:00:29 AM

## 2021-03-19 NOTE — Progress Notes (Signed)
GASTROENTEROLOGY PROCEDURE H&P NOTE   Primary Care Physician: Redmond School, MD    Reason for Procedure:   Chronic N&V, sigmoid colon thickening  Plan:    EGD/colonoscopy  Patient is appropriate for endoscopic procedure(s) in the ambulatory ([place]) setting.  The nature of the procedure, as well as the risks, benefits, and alternatives were carefully and thoroughly reviewed with the patient. Ample time for discussion and questions allowed. The patient understood, was satisfied, and agreed to proceed.     HPI: Julia [name] is [name] 61 y.o. female who presents for EGD/colonoscopy for evaluation of chronic N&V, sigmoid colon thickening .  Patient was most recently seen in the Gastroenterology Clinic on 02/08/21.  No interval change in medical history since that appointment. Please refer to that note for full details regarding GI history and clinical presentation.   Past Medical History:  Diagnosis Date   Anxiety    Brain tumor (benign) (HCC)    Chronic pain    Depression    Elevated cholesterol    Headache(784.0)    Hypertension    Numerous moles    Seizures ([place])    Trichimoniasis     Past Surgical History:  Procedure Laterality Date   EXCISION OF SKIN TAG N/[name] 09/22/2017   Procedure: EXCISION OF SKIN TAGS ON FACE AND NECK (Procedure #2);  Surgeon: Jonnie Kind, MD;  Location: [facility];  Service: Gynecology;  Laterality: N/[name];   HYSTEROSCOPY WITH D & C N/[name] 09/22/2017   Procedure: DILATATION AND CURETTAGE /HYSTEROSCOPY; EXCISION OF VULVAR AND RIGHT AND LEFT THIGH SKIN TAGS (Procedure #1);  Surgeon: Jonnie Kind, MD;  Location: [facility];  Service: Gynecology;  Laterality: N/[name];   POLYPECTOMY N/[name] 09/22/2017   Procedure: REMOVAL OF ENDOMETRIAL POLYP (Procedure #1);  Surgeon: Jonnie Kind, MD;  Location: [facility];  Service: Gynecology;  Laterality: N/[name];   TUBAL LIGATION      Prior to Admission medications   Medication Sig Start Date End Date Taking? Authorizing Provider   ALPRAZolam Duanne Moron) 1 MG tablet Take 1 mg by mouth 3 (three) times daily.   Yes [provider]  buPROPion (WELLBUTRIN SR) 150 MG 12 hr tablet Take 150 mg by mouth 2 (two) times daily. 11/07/20  Yes [provider]  ezetimibe (ZETIA) 10 MG tablet Take 10 mg by mouth daily.   Yes [provider]  hydrochlorothiazide (MICROZIDE) 12.5 MG capsule Take 12.5 mg by mouth daily. 05/28/20  Yes [provider]  losartan (COZAAR) 50 MG tablet Take 50 mg by mouth daily.  09/01/13  Yes [provider]  omeprazole (PRILOSEC) 20 MG capsule Take 20 mg by mouth daily.   Yes [provider]  potassium chloride SA (KLOR-CON) 20 MEQ tablet Take 20 mEq by mouth daily. 08/02/20  Yes [provider]  pravastatin (PRAVACHOL) 40 MG tablet Take 40 mg by mouth daily. 05/20/12  Yes [provider]  sertraline (ZOLOFT) 25 MG tablet Take 25 mg by mouth daily.   Yes [provider]  albuterol (PROVENTIL HFA;VENTOLIN HFA) 108 (90 Base) MCG/ACT inhaler Inhale 1-2 puffs into the lungs every 6 (six) hours as needed for shortness of breath. 09/18/17   [provider]  capsicum (ZOSTRIX) 0.075 % topical cream Apply 1 application topically 2 (two) times daily. 03/06/21   Julia Shanks, MD  ibuprofen (ADVIL) 200 MG tablet Take 400 mg by mouth every 4 (four) hours as needed.    [provider]  lidocaine (LIDODERM) 5 %  Place 1 patch onto the skin daily. Remove & Discard patch within 12 hours or as directed by MD 03/08/21   Henderly, Britni A, PA-C  loperamide (IMODIUM) 2 MG capsule Take 2 mg by mouth 2 (two) times daily. 11/05/20   [provider]  methocarbamol (ROBAXIN) 500 MG tablet Take 1 tablet (500 mg total) by mouth 2 (two) times daily. 03/08/21   Henderly, Britni A, PA-C  metoCLOPramide (REGLAN) 10 MG tablet Take 1 tablet 30 minutes prior to each bowel prep. Patient not taking: Reported on 02/20/2021 02/08/21   Julia Craze,  NP  promethazine (PHENERGAN) 25 MG suppository Place 1 suppository (25 mg total) rectally every 6 (six) hours as needed for nausea or vomiting. Patient not taking: Reported on 02/20/2021 12/11/20   Julia Ferguson, MD  traMADol (ULTRAM) 50 MG tablet Take 50 mg by mouth every 6 (six) hours. Patient not taking: Reported on 03/19/2021 01/29/21   [provider]    Current Outpatient Medications  Medication Sig Dispense Refill   ALPRAZolam (XANAX) 1 MG tablet Take 1 mg by mouth 3 (three) times daily.     buPROPion (WELLBUTRIN SR) 150 MG 12 hr tablet Take 150 mg by mouth 2 (two) times daily.     ezetimibe (ZETIA) 10 MG tablet Take 10 mg by mouth daily.     hydrochlorothiazide (MICROZIDE) 12.5 MG capsule Take 12.5 mg by mouth daily.     losartan (COZAAR) 50 MG tablet Take 50 mg by mouth daily.      omeprazole (PRILOSEC) 20 MG capsule Take 20 mg by mouth daily.     potassium chloride SA (KLOR-CON) 20 MEQ tablet Take 20 mEq by mouth daily.     pravastatin (PRAVACHOL) 40 MG tablet Take 40 mg by mouth daily.     sertraline (ZOLOFT) 25 MG tablet Take 25 mg by mouth daily.     albuterol (PROVENTIL HFA;VENTOLIN HFA) 108 (90 Base) MCG/ACT inhaler Inhale 1-2 puffs into the lungs every 6 (six) hours as needed for shortness of breath.     capsicum (ZOSTRIX) 0.075 % topical cream Apply 1 application topically 2 (two) times daily. 28.3 g 0   ibuprofen (ADVIL) 200 MG tablet Take 400 mg by mouth every 4 (four) hours as needed.     lidocaine (LIDODERM) 5 % Place 1 patch onto the skin daily. Remove & Discard patch within 12 hours or as directed by MD 30 patch 0   loperamide (IMODIUM) 2 MG capsule Take 2 mg by mouth 2 (two) times daily.     methocarbamol (ROBAXIN) 500 MG tablet Take 1 tablet (500 mg total) by mouth 2 (two) times daily. 20 tablet 0   metoCLOPramide (REGLAN) 10 MG tablet Take 1 tablet 30 minutes prior to each bowel prep. (Patient not taking: Reported on 02/20/2021) 2 tablet 0   promethazine  (PHENERGAN) 25 MG suppository Place 1 suppository (25 mg total) rectally every 6 (six) hours as needed for nausea or vomiting. (Patient not taking: Reported on 02/20/2021) 12 each 1   traMADol (ULTRAM) 50 MG tablet Take 50 mg by mouth every 6 (six) hours. (Patient not taking: Reported on 03/19/2021)     Current Facility-Administered Medications  Medication Dose Route Frequency Provider Last Rate Last Admin   0.9 %  sodium chloride infusion  500 mL Intravenous Once Sharyn Creamer, MD        Allergies as of 03/19/2021 - Review Complete 03/19/2021  Allergen Reaction Noted   Naprosyn [naproxen] Nausea Only 06/02/2012  Penicillins Nausea And Vomiting and Other (See Comments) 10/16/2014   Promethazine hcl Nausea Only 11/19/2010    Family History  Problem Relation Age of Onset   Stroke Mother    Heart failure Mother    Cancer Father        lung   Diabetes Brother     Social History   Socioeconomic History   Marital status: Widowed    Spouse name: Not on file   Number of children: 3   Years of education: Not on file   Highest education level: Not on file  Occupational History   Not on file  Tobacco Use   Smoking status: Never   Smokeless tobacco: Never  Vaping Use   Vaping Use: Never used  Substance and Sexual Activity   Alcohol use: No   Drug use: No   Sexual activity: Not Currently    Birth control/protection: Post-menopausal, Surgical    Comment: tubal  Other Topics Concern   Not on file  Social History Narrative   Not on file   Social Determinants of Health   Financial Resource Strain: Not on file  Food Insecurity: Not on file  Transportation Needs: Not on file  Physical Activity: Not on file  Stress: Not on file  Social Connections: Not on file  Intimate Partner Violence: Not on file    Physical Exam: Vital signs in last 24 hours: BP (!) 151/98    Pulse 88    Temp 97.8 F (36.6 C)    Ht 5' (1.524 m)    Wt 129 lb (58.5 kg)    LMP 09/06/2012    SpO2 100%     BMI 25.19 kg/m  GEN: NAD EYE: Sclerae anicteric ENT: MMM CV: Non-tachycardic Pulm: No increased WOB GI: Soft NEURO:  Alert & Oriented   [name], MD [facility]   03/19/2021 8:59 AM

## 2021-03-19 NOTE — Progress Notes (Signed)
Sedate, gd SR, tolerated procedure well, VSS, report to RN 

## 2021-03-19 NOTE — Progress Notes (Signed)
Called to room to assist during endoscopic procedure.  Patient ID and intended procedure confirmed with present staff. Received instructions for my participation in the procedure from the performing physician.  

## 2021-03-19 NOTE — Patient Instructions (Signed)

## 2021-03-19 NOTE — Op Note (Signed)
[place] Patient Name: Lennyx Verdell Procedure Date: 03/19/2021 9:16 AM MRN: [id number] Endoscopist: Sonny Masters "[name] ,  Age: 61 Referring MD:  Date of Birth: 08/24/1960 Gender: Female Account #: [id number] Procedure:                Upper GI endoscopy Indications:              Nausea with vomiting Medicines:                Monitored Anesthesia Care Procedure:                Pre-Anesthesia Assessment:                           - Prior to the procedure, a History and Physical                            was performed, and patient medications and                            allergies were reviewed. The patient's tolerance of                            previous anesthesia was also reviewed. The risks                            and benefits of the procedure and the sedation                            options and risks were discussed with the patient.                            All questions were answered, and informed consent                            was obtained. Prior Anticoagulants: The patient has                            taken no previous anticoagulant or antiplatelet                            agents. ASA Grade Assessment: II - A patient with                            mild systemic disease. After reviewing the risks                            and benefits, the patient was deemed in                            satisfactory condition to undergo the procedure.                           After obtaining informed consent, the endoscope was  passed under direct vision. Throughout the                            procedure, the patient's blood pressure, pulse, and                            oxygen saturations were monitored continuously. The                            Endoscope was introduced through the mouth, and                            advanced to the second part of duodenum. The upper                            GI endoscopy was accomplished  without difficulty.                            The patient tolerated the procedure well. Scope In: Scope Out: Findings:                 The examined esophagus was normal. Biopsies were                            taken with a cold forceps for histology.                           Localized inflammation characterized by congestion                            (edema) and erythema was found in the gastric body                            and in the gastric antrum. Biopsies were taken with                            a cold forceps for histology.                           A small amount of food (residue) was found in the                            gastric body and in the gastric antrum.                           The examined duodenum was normal. Biopsies for                            histology were taken with a cold forceps for                            evaluation of celiac disease. Complications:            No immediate complications. Estimated Blood Loss:     Estimated blood  loss was minimal. Impression:               - Normal esophagus. Biopsied.                           - Gastritis. Biopsied.                           - A small amount of food (residue) in the stomach.                           - Normal examined duodenum. Biopsied. Recommendation:           - Await pathology results.                           - Perform a colonoscopy today. Sonny Masters "[name],  03/19/2021 9:56:03 AM

## 2021-03-21 ENCOUNTER — Telehealth: Payer: Self-pay

## 2021-03-21 ENCOUNTER — Telehealth: Payer: Self-pay | Admitting: *Deleted

## 2021-03-21 NOTE — Telephone Encounter (Signed)
Attempted f/u call back. No answer, left VM. 

## 2021-03-21 NOTE — Telephone Encounter (Signed)
°  Follow up Call-  Call back number 03/19/2021  Post procedure Call Back phone  # [phone] (Charlene-daughter)  Permission to leave phone message Yes  Some recent data might be hidden     Patient questions:  Do you have a fever, pain , or abdominal swelling? No. Pain Score  0 *  Have you tolerated food without any problems? Yes.    Have you been able to return to your normal activities? Yes.    Do you have any questions about your discharge instructions: Diet   No. Medications  No. Follow up visit  No.  Do you have questions or concerns about your Care? No.  Actions: * If pain score is 4 or above: No action needed, pain <4.  Have you developed a fever since your procedure? no  2.   Have you had an respiratory symptoms (SOB or cough) since your procedure? no  3.   Have you tested positive for COVID 19 since your procedure no  4.   Have you had any family members/close contacts diagnosed with the COVID 19 since your procedure?  no   If yes to any of these questions please route to Joylene John, RN and Joella Prince, RN

## 2021-03-22 ENCOUNTER — Encounter: Payer: Self-pay | Admitting: Internal Medicine

## 2021-03-28 ENCOUNTER — Other Ambulatory Visit: Payer: Medicare HMO | Admitting: Adult Health

## 2021-04-03 DIAGNOSIS — I10 Essential (primary) hypertension: Secondary | ICD-10-CM | POA: Diagnosis not present

## 2021-04-03 DIAGNOSIS — R634 Abnormal weight loss: Secondary | ICD-10-CM | POA: Diagnosis not present

## 2021-04-03 DIAGNOSIS — R109 Unspecified abdominal pain: Secondary | ICD-10-CM | POA: Diagnosis not present

## 2021-04-03 DIAGNOSIS — G894 Chronic pain syndrome: Secondary | ICD-10-CM | POA: Diagnosis not present

## 2021-04-03 DIAGNOSIS — M3500 Sicca syndrome, unspecified: Secondary | ICD-10-CM | POA: Diagnosis not present

## 2021-04-03 DIAGNOSIS — E538 Deficiency of other specified B group vitamins: Secondary | ICD-10-CM | POA: Diagnosis not present

## 2021-04-03 DIAGNOSIS — Z0001 Encounter for general adult medical examination with abnormal findings: Secondary | ICD-10-CM | POA: Diagnosis not present

## 2021-04-03 DIAGNOSIS — Z6825 Body mass index (BMI) 25.0-25.9, adult: Secondary | ICD-10-CM | POA: Diagnosis not present

## 2021-04-03 DIAGNOSIS — E213 Hyperparathyroidism, unspecified: Secondary | ICD-10-CM | POA: Diagnosis not present

## 2021-04-03 DIAGNOSIS — E559 Vitamin D deficiency, unspecified: Secondary | ICD-10-CM | POA: Diagnosis not present

## 2021-04-23 ENCOUNTER — Encounter: Payer: Self-pay | Admitting: Internal Medicine

## 2021-04-23 ENCOUNTER — Ambulatory Visit (INDEPENDENT_AMBULATORY_CARE_PROVIDER_SITE_OTHER): Payer: Medicare HMO | Admitting: Internal Medicine

## 2021-04-23 ENCOUNTER — Ambulatory Visit: Payer: Medicare HMO | Admitting: Internal Medicine

## 2021-04-23 VITALS — BP 100/62 | HR 89 | Ht 61.0 in | Wt 132.2 lb

## 2021-04-23 DIAGNOSIS — Z86010 Personal history of colonic polyps: Secondary | ICD-10-CM | POA: Diagnosis not present

## 2021-04-23 DIAGNOSIS — R112 Nausea with vomiting, unspecified: Secondary | ICD-10-CM | POA: Diagnosis not present

## 2021-04-23 DIAGNOSIS — Z8719 Personal history of other diseases of the digestive system: Secondary | ICD-10-CM | POA: Diagnosis not present

## 2021-04-23 NOTE — Progress Notes (Signed)
Chief Complaint: Abdominal discomfort, N&V  HPI : 61 year old female with history of HTN, seizures, anxiety presents with abdominal discomfort and N&V.  Interval History: She has been doing well since her EGD and colonoscopy procedures.  Denies having any issues with hemorrhoids at this time.  She is not having any more abdominal discomfort.  Has still occasional nausea and vomiting for which she uses Phenergan suppositories.  At this time she feels like her GI symptoms are under good control.   Current Outpatient Medications  Medication Sig Dispense Refill   albuterol (PROVENTIL HFA;VENTOLIN HFA) 108 (90 Base) MCG/ACT inhaler Inhale 1-2 puffs into the lungs every 6 (six) hours as needed for shortness of breath.     ALPRAZolam (XANAX) 1 MG tablet Take 1 mg by mouth 3 (three) times daily.     buPROPion (WELLBUTRIN XL) 150 MG 24 hr tablet Take 150 mg by mouth daily.     Cholecalciferol (VITAMIN D3) 50 MCG (2000 UT) CAPS Take 1 capsule by mouth daily.     ezetimibe (ZETIA) 10 MG tablet Take 10 mg by mouth daily.     furosemide (LASIX) 20 MG tablet Take 20 mg by mouth.     losartan (COZAAR) 50 MG tablet Take 50 mg by mouth daily.      omeprazole (PRILOSEC) 20 MG capsule Take 20 mg by mouth daily.     potassium chloride SA (KLOR-CON) 20 MEQ tablet Take 20 mEq by mouth daily.     pravastatin (PRAVACHOL) 40 MG tablet Take 40 mg by mouth daily.     promethazine (PHENERGAN) 25 MG suppository Place 1 suppository (25 mg total) rectally every 6 (six) hours as needed for nausea or vomiting. 12 each 1   No current facility-administered medications for this visit.    Review of Systems: All systems reviewed and negative except where noted in HPI.   Physical Exam: BP 100/62    Pulse 89    Ht 5\' 1"  (1.549 m)    Wt 132 lb 3.2 oz (60 kg)    LMP 09/06/2012    SpO2 99%    BMI 24.98 kg/m  Constitutional: Pleasant,well-developed, female in no acute distress. HEENT: Normocephalic and atraumatic.  Conjunctivae are normal. No scleral icterus. Cardiovascular: Normal rate Pulmonary/chest: No increased WOB Abdominal: Soft, nondistended, nontender. Bowel sounds active throughout. There are no masses palpable. No hepatomegaly. Extremities: No edema Neurological: Alert and oriented to person place and time. Skin: Skin is warm and dry. No rashes noted. Psychiatric: Normal mood and affect. Behavior is normal.  Labs 02/2021: CBC unremarkable. CMP with elevated Ca 11.1. Lipase nml.  Colonoscopy 03/19/21: - The examined portion of the ileum was normal. - Diverticulosis in the sigmoid colon, in the descending colon, in the transverse colon, in the ascending colon and in the cecum. - Four 3 to 10 mm polyps in the transverse colon, removed with a cold snare. Resected and retrieved. - Non-bleeding internal hemorrhoids. Path: Surgical [P], colon, transverse, polyp (4) TUBULAR ADENOMAS NEGATIVE FOR HIGH-GRADE DYSPLASIA AND CARCINOMA  EGD 03/19/21: - Normal esophagus. Biopsied. - Gastritis. Biopsied. - A small amount of food (residue) in the stomach. - Normal examined duodenum. Biopsied. Path: 1. Surgical [P], duodenum BENIGN DUODENAL MUCOSA WITH NO DIAGNOSTIC ABNORMALITY 2. Surgical [P], gastric CHRONIC GASTRITIS WITH LYMPHOID AGGREGATE HELICOBACTER STAIN NEGATIVE (I[facility], ADEQUATE CONTROL) NEGATIVE FOR INTESTINAL METAPLASIA, DYSPLASIA AND CARCINOMA 3. 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  H. pylori test was negative.  Her abdominal discomfort has resolved, and her nausea is well controlled on Phenergan therapy.  - Continue Phenergan suppositories - Recall for colonoscopy for polyp surveillance in 03/2024 - RTC PRN.  If patient were to have recurrent nausea and vomiting in  the future, can trial PPI twice daily therapy or consider GES.  [name], MD  I spent 33 minutes of time, including in depth chart review, independent review of results as outlined above, communicating results with the patient directly, face-to-face time with the patient, coordinating care, and ordering studies and medications as appropriate, and documentation.

## 2021-04-23 NOTE — Patient Instructions (Signed)
If you are age 62 or older, your body mass index should be between 23-30. Your Body mass index is 24.98 kg/m. If this is out of the aforementioned range listed, please consider follow up with your Primary Care Provider.  If you are age 52 or younger, your body mass index should be between 19-25. Your Body mass index is 24.98 kg/m. If this is out of the aformentioned range listed, please consider follow up with your Primary Care Provider.   Follow as needed. Continue Phenergan as needed. Glad you are feeling better!  ________________________________________________________  The [facility] providers would like to encourage you to use [facility] to communicate with providers for non-urgent requests or questions.  Due to long hold times on the telephone, sending your provider a message by [facility] may be a faster and more efficient way to get a response.  Please allow 48 business hours for a response.  Please remember that this is for non-urgent requests.  _______________________________________________________  It was a pleasure to see you today!  Thank you for trusting me with your gastrointestinal care!    [name], MD

## 2021-04-25 DIAGNOSIS — I10 Essential (primary) hypertension: Secondary | ICD-10-CM | POA: Diagnosis not present

## 2021-04-25 DIAGNOSIS — G894 Chronic pain syndrome: Secondary | ICD-10-CM | POA: Diagnosis not present

## 2021-04-25 DIAGNOSIS — Q8500 Neurofibromatosis, unspecified: Secondary | ICD-10-CM | POA: Diagnosis not present

## 2021-04-25 DIAGNOSIS — E213 Hyperparathyroidism, unspecified: Secondary | ICD-10-CM | POA: Diagnosis not present

## 2021-04-25 DIAGNOSIS — E663 Overweight: Secondary | ICD-10-CM | POA: Diagnosis not present

## 2021-04-25 DIAGNOSIS — Z6825 Body mass index (BMI) 25.0-25.9, adult: Secondary | ICD-10-CM | POA: Diagnosis not present

## 2021-04-27 ENCOUNTER — Emergency Department (HOSPITAL_COMMUNITY): Payer: Medicare HMO

## 2021-04-27 ENCOUNTER — Emergency Department (HOSPITAL_COMMUNITY)
Admission: EM | Admit: 2021-04-27 | Discharge: 2021-04-27 | Disposition: A | Discharge status: Home / Self Care | Payer: Medicare HMO | Attending: Emergency Medicine | Admitting: Emergency Medicine

## 2021-04-27 ENCOUNTER — Encounter (HOSPITAL_COMMUNITY): Payer: Self-pay

## 2021-04-27 ENCOUNTER — Other Ambulatory Visit: Payer: Self-pay

## 2021-04-27 ENCOUNTER — Ambulatory Visit: Payer: Self-pay

## 2021-04-27 DIAGNOSIS — K2900 Acute gastritis without bleeding: Secondary | ICD-10-CM

## 2021-04-27 DIAGNOSIS — E871 Hypo-osmolality and hyponatremia: Secondary | ICD-10-CM | POA: Insufficient documentation

## 2021-04-27 DIAGNOSIS — Z79899 Other long term (current) drug therapy: Secondary | ICD-10-CM | POA: Diagnosis not present

## 2021-04-27 DIAGNOSIS — R945 Abnormal results of liver function studies: Secondary | ICD-10-CM | POA: Diagnosis not present

## 2021-04-27 DIAGNOSIS — Z20822 Contact with and (suspected) exposure to covid-19: Secondary | ICD-10-CM | POA: Diagnosis not present

## 2021-04-27 DIAGNOSIS — R109 Unspecified abdominal pain: Secondary | ICD-10-CM | POA: Diagnosis not present

## 2021-04-27 DIAGNOSIS — Z86018 Personal history of other benign neoplasm: Secondary | ICD-10-CM | POA: Diagnosis not present

## 2021-04-27 DIAGNOSIS — K5730 Diverticulosis of large intestine without perforation or abscess without bleeding: Secondary | ICD-10-CM | POA: Insufficient documentation

## 2021-04-27 DIAGNOSIS — R1010 Upper abdominal pain, unspecified: Secondary | ICD-10-CM | POA: Diagnosis present

## 2021-04-27 DIAGNOSIS — I10 Essential (primary) hypertension: Secondary | ICD-10-CM | POA: Diagnosis not present

## 2021-04-27 DIAGNOSIS — I700 Atherosclerosis of aorta: Secondary | ICD-10-CM | POA: Diagnosis not present

## 2021-04-27 LAB — CBC WITH DIFFERENTIAL/PLATELET
Abs Immature Granulocytes: 0.01 10*3/uL (ref 0.00–0.07)
Basophils Absolute: 0 10*3/uL (ref 0.0–0.1)
Basophils Relative: 1 %
Eosinophils Absolute: 0.1 10*3/uL (ref 0.0–0.5)
Eosinophils Relative: 1 %
HCT: 38.8 % (ref 36.0–46.0)
Hemoglobin: 13.3 g/dL (ref 12.0–15.0)
Immature Granulocytes: 0 %
Lymphocytes Relative: 24 %
Lymphs Abs: 1.4 10*3/uL (ref 0.7–4.0)
MCH: 30.9 pg (ref 26.0–34.0)
MCHC: 34.3 g/dL (ref 30.0–36.0)
MCV: 90 fL (ref 80.0–100.0)
Monocytes Absolute: 0.6 10*3/uL (ref 0.1–1.0)
Monocytes Relative: 9 %
Neutro Abs: 3.8 10*3/uL (ref 1.7–7.7)
Neutrophils Relative %: 65 %
Platelets: 204 10*3/uL (ref 150–400)
RBC: 4.31 MIL/uL (ref 3.87–5.11)
RDW: 13.1 % (ref 11.5–15.5)
WBC: 5.9 10*3/uL (ref 4.0–10.5)
nRBC: 0 % (ref 0.0–0.2)

## 2021-04-27 LAB — COMPREHENSIVE METABOLIC PANEL
ALT: 28 U/L (ref 0–44)
AST: 21 U/L (ref 15–41)
Albumin: 3.8 g/dL (ref 3.5–5.0)
Alkaline Phosphatase: 99 U/L (ref 38–126)
Anion gap: 9 (ref 5–15)
BUN: 11 mg/dL (ref 8–23)
CO2: 22 mmol/L (ref 22–32)
Calcium: 10.5 mg/dL — ABNORMAL HIGH (ref 8.9–10.3)
Chloride: 99 mmol/L (ref 98–111)
Creatinine, Ser: 0.83 mg/dL (ref 0.44–1.00)
GFR, Estimated: >60 mL/min (ref >60)
Glucose, Bld: 99 mg/dL (ref 70–99)
Potassium: 3.8 mmol/L (ref 3.5–5.1)
Sodium: 130 mmol/L — ABNORMAL LOW (ref 135–145)
Total Bilirubin: 2.2 mg/dL — ABNORMAL HIGH (ref 0.3–1.2)
Total Protein: 6.6 g/dL (ref 6.5–8.1)

## 2021-04-27 LAB — LIPASE, BLOOD: Lipase: 33 U/L (ref 11–51)

## 2021-04-27 LAB — RESP PANEL BY RT-PCR (FLU A&B, COVID) ARPGX2
Influenza A by PCR: NEGATIVE
Influenza B by PCR: NEGATIVE
SARS Coronavirus 2 by RT PCR: NEGATIVE

## 2021-04-27 MED ORDER — IOHEXOL 300 MG/ML  SOLN
100.0000 mL | Freq: Once | INTRAMUSCULAR | Status: AC | PRN
  Administered 2021-04-27: 100 mL via INTRAVENOUS

## 2021-04-27 MED ORDER — OMEPRAZOLE 20 MG PO CPDR: 40.0000 mg | DELAYED_RELEASE_CAPSULE | Freq: Every day | ORAL | 0 refills | Status: DC

## 2021-04-27 MED ORDER — DICYCLOMINE HCL 10 MG PO CAPS
10.0000 mg | ORAL_CAPSULE | Freq: Once | ORAL | Status: AC
  Administered 2021-04-27: 10 mg via ORAL
  Filled 2021-04-27: qty 1

## 2021-04-27 MED ORDER — FAMOTIDINE 20 MG PO TABS
20.0000 mg | ORAL_TABLET | Freq: Once | ORAL | Status: AC
  Administered 2021-04-27: 20 mg via ORAL
  Filled 2021-04-27: qty 1

## 2021-04-27 MED ORDER — ALUM & MAG HYDROXIDE-SIMETH 200-200-20 MG/5ML PO SUSP
30.0000 mL | Freq: Once | ORAL | Status: AC
  Administered 2021-04-27: 30 mL via ORAL
  Filled 2021-04-27: qty 30

## 2021-04-27 MED ORDER — OMEPRAZOLE 40 MG PO CPDR: 40.0000 mg | DELAYED_RELEASE_CAPSULE | Freq: Every day | ORAL | 0 refills | Status: DC

## 2021-04-27 NOTE — ED Triage Notes (Signed)
Pt presents to ED with complaints of sharp intermittent abdominal pain since Thursday, pt denies N/V/D

## 2021-04-27 NOTE — Discharge Instructions (Addendum)
Lab work imaging all reassuring, likely you have gastritis, I would like you to start taking Prilosec 40 mg for the next 7 days, sent in a prescription.  When you finish this prescription please go back to 20 mg of Prilosec as prescribed.  You may also take Pepcid as needed this can also help with your stomach pain.  I also recommend a bland diet.  If you continue to have symptoms please follow-up with your GI doctor for further evaluation.  Come back to the emergency department if you develop chest pain, shortness of breath, severe abdominal pain, uncontrolled nausea, vomiting, diarrhea.

## 2021-04-27 NOTE — ED Provider Notes (Signed)
[facility] Provider Note   CSN: [id number] Arrival date & time: 04/27/21  2094     History  Chief Complaint  Patient presents with   Abdominal Pain    Julia [name] is a 61 y.o. female.  HPI  Patient with medical history including hypertension, tubal ligation, hysterectomy presents with complaints of generalized stomach pain, patient states it started yesterday, states that she ate [employer] on Thursday and she felt fine but the following morning she started to have stomach pain.  she states its around her umbilicus/upper abdominal area, does not radiating, states its remained constant, describes as a burning-like sensation but also has a throbbing-like pain, pain is consistent, does not radiate into her back, no associated nausea vomiting constipation diarrhea, states she had a normal bowel movement today, denies melena or hematochezia, denies any urinary symptoms, no fevers, chills, nutrition general body aches.  No one in the house is experiencing the symptoms,    Reviewed patient's charts followed by gastroenterology, she has had a recent colonoscopy as well as an EDG performed which which revealed gastritis with negative H. pylori, she is currently on Phenergan for chronic nausea.    Home Medications Prior to Admission medications   Medication Sig Start Date End Date Taking? Authorizing Provider  albuterol (PROVENTIL HFA;VENTOLIN HFA) 108 (90 Base) MCG/ACT inhaler Inhale 1-2 puffs into the lungs every 6 (six) hours as needed for shortness of breath. 09/18/17   [provider]  ALPRAZolam Duanne Moron) 1 MG tablet Take 1 mg by mouth 3 (three) times daily.    [provider]  buPROPion (WELLBUTRIN XL) 150 MG 24 hr tablet Take 150 mg by mouth daily.    [provider]  Cholecalciferol (VITAMIN D3) 50 MCG (2000 UT) CAPS Take 1 capsule by mouth daily.    [provider]  ezetimibe (ZETIA) 10 MG tablet Take 10 mg by mouth daily.     [provider]  furosemide (LASIX) 20 MG tablet Take 20 mg by mouth.    [provider]  losartan (COZAAR) 50 MG tablet Take 50 mg by mouth daily.  09/01/13   [provider]  omeprazole (PRILOSEC) 20 MG capsule Take 20 mg by mouth daily.    [provider]  omeprazole (PRILOSEC) 40 MG capsule Take 1 capsule (40 mg total) by mouth daily for 7 days. 04/27/21 05/04/21  Marcello Fennel, PA-C  potassium chloride SA (KLOR-CON) 20 MEQ tablet Take 20 mEq by mouth daily. 08/02/20   [provider]  pravastatin (PRAVACHOL) 40 MG tablet Take 40 mg by mouth daily. 05/20/12   [provider]  promethazine (PHENERGAN) 25 MG suppository Place 1 suppository (25 mg total) rectally every 6 (six) hours as needed for nausea or vomiting. 12/11/20   Milton Ferguson, MD      Allergies    Naprosyn [naproxen], Penicillins, and Promethazine hcl    Review of Systems   Review of Systems  Constitutional:  Negative for chills and fever.  Respiratory:  Negative for shortness of breath.   Cardiovascular:  Negative for chest pain.  Gastrointestinal:  Positive for abdominal pain. Negative for diarrhea, nausea and vomiting.  Neurological:  Negative for headaches.   Physical Exam Updated Vital Signs BP (!) 129/91    Pulse 85    Temp (!) 97.5 F (36.4 C) (Oral)    Resp 20    Ht 5\' 1"  (1.549 m)    Wt 60.3 kg    LMP 09/06/2012  SpO2 100%    BMI 25.13 kg/m  Physical Exam Vitals and nursing note reviewed.  Constitutional:      General: She is not in acute distress.    Appearance: She is not ill-appearing.  HENT:     Head: Normocephalic and atraumatic.     Nose: No congestion.  Eyes:     Conjunctiva/sclera: Conjunctivae normal.  Cardiovascular:     Rate and Rhythm: Normal rate and regular rhythm.     Pulses: Normal pulses.     Heart sounds: No murmur heard.   No friction rub. No gallop.  Pulmonary:     Effort: No respiratory distress.     Breath sounds: No  wheezing, rhonchi or rales.  Abdominal:     Palpations: Abdomen is soft.     Tenderness: There is abdominal tenderness. There is no right CVA tenderness, left CVA tenderness or guarding.     Comments: Abdomen nondistended, normal bowel sounds, dull to percussion, no guarding, rebound tenderness, peritoneal sign, she had non focalized tenderness but she appears to be little bit more tender in her upper abdomen, no CVA tenderness.  Musculoskeletal:     Right lower leg: No edema.     Left lower leg: No edema.  Skin:    General: Skin is warm and dry.  Neurological:     Mental Status: She is alert.  Psychiatric:        Mood and Affect: Mood normal.    ED Results / Procedures / Treatments   Labs (all labs ordered are listed, but only abnormal results are displayed) Labs Reviewed  COMPREHENSIVE METABOLIC PANEL - Abnormal; Notable for the following components:      Result Value   Sodium 130 (*)    Calcium 10.5 (*)    Total Bilirubin 2.2 (*)    All other components within normal limits  RESP PANEL BY RT-PCR (FLU A&B, COVID) ARPGX2  CBC WITH DIFFERENTIAL/PLATELET  LIPASE, BLOOD    EKG None  Radiology CT ABDOMEN PELVIS W CONTRAST  Result Date: 04/27/2021 CLINICAL DATA:  Nonlocalized abdominal pain. EXAM: CT ABDOMEN AND PELVIS WITH CONTRAST TECHNIQUE: Multidetector CT imaging of the abdomen and pelvis was performed using the standard protocol following bolus administration of intravenous contrast. RADIATION DOSE REDUCTION: This exam was performed according to the departmental dose-optimization program which includes automated exposure control, adjustment of the mA and/or kV according to patient size and/or use of iterative reconstruction technique. CONTRAST:  14mL OMNIPAQUE IOHEXOL 300 MG/ML  SOLN COMPARISON:  03/08/2021 FINDINGS: Lower chest: Unremarkable. Hepatobiliary: Scattered tiny hypodensities in the liver stable, too small to characterize but likely benign. There is no evidence for  gallstones, gallbladder wall thickening, or pericholecystic fluid. No intrahepatic or extrahepatic biliary dilation. Pancreas: No focal mass lesion. No dilatation of the main duct. No intraparenchymal cyst. No peripancreatic edema. Spleen: Subtle scattered hypoattenuating lesions in the spleen are unchanged. Adrenals/Urinary Tract: Right adrenal gland unremarkable. Stable 1.7 cm left adrenal nodule. Although images are unavailable, the report for abdomen CT of 07/03/2005 documented a 1.5 cm left adrenal adenoma with average attenuation of -10 Hounsfield units. Kidneys unremarkable. No evidence for hydroureter. The urinary bladder appears normal for the degree of distention. Stomach/Bowel: Stomach is unremarkable. No gastric wall thickening. No evidence of outlet obstruction. Duodenum is normally positioned as is the ligament of Treitz. No small bowel wall thickening. No small bowel dilatation. The terminal ileum is normal. The appendix is normal. No gross colonic mass. No colonic wall thickening. Diverticular changes  are noted in the left colon without evidence of diverticulitis. Vascular/Lymphatic: There is mild atherosclerotic calcification of the abdominal aorta without aneurysm. There is no gastrohepatic or hepatoduodenal ligament lymphadenopathy. No retroperitoneal or mesenteric lymphadenopathy. No pelvic sidewall lymphadenopathy. Reproductive: The uterus is unremarkable.  There is no adnexal mass. Other: No intraperitoneal free fluid. Musculoskeletal: No worrisome lytic or sclerotic osseous abnormality. IMPRESSION: 1. No acute findings in the abdomen or pelvis. 2. Left colonic diverticulosis without diverticulitis. 3. Stable 1.7 cm left adrenal nodule documented on multiple prior exams back to 2007 and compatible with adenoma. 4. Aortic Atherosclerosis (ICD10-I70.0). Electronically Signed   By: Misty Stanley M.D.   On: 04/27/2021 11:51    Procedures Procedures    Medications Ordered in ED Medications   alum & mag hydroxide-simeth (MAALOX/MYLANTA) 200-200-20 MG/5ML suspension 30 mL (30 mLs Oral Given 04/27/21 1004)  famotidine (PEPCID) tablet 20 mg (20 mg Oral Given 04/27/21 1004)  dicyclomine (BENTYL) capsule 10 mg (10 mg Oral Given 04/27/21 1004)  iohexol (OMNIPAQUE) 300 MG/ML solution 100 mL (100 mLs Intravenous Contrast Given 04/27/21 1132)    ED Course/ Medical Decision Making/ A&P                           Medical Decision Making Amount and/or Complexity of Data Reviewed Labs: ordered. Radiology: ordered.  Risk OTC drugs. Prescription drug management.   This patient presents to the ED for concern of stomach pain, this involves an extensive number of treatment options, and is a complaint that carries with it a high risk of complications and morbidity.  The differential diagnosis includes obstruction, perforated stomach ulcer, dissection, diverticulitis    Additional history obtained:  Additional history obtained from electronic medical record External records from outside source obtained and reviewed including please see HPI for further detail   Co morbidities that complicate the patient evaluation  Gastritis  Social Determinants of Health:  N/A    Lab Tests:  I Ordered, and personally interpreted labs.  The pertinent results include: CBC unremarkable, CMP shows sodium 130 calcium 10.5 below her baseline, T. bili slightly elevated 2.2 lipase 33   Imaging Studies ordered:  I ordered imaging studies including CT abdomen pelvis I independently visualized and interpreted imaging which showed negative for acute findings I agree with the radiologist interpretation    Reevaluation:  On initial evaluation patient presents with stomach pain possible gastroenteritis, provided with GI cocktail Bentyl and Pepcid she is reassessed states her pain had decreased from a 6 down to a 4 but she still has tenderness, reassess her abdomen still tender mainly around her umbilicus  region, I am concerned for possible diverticulitis, stomach ulcer will obtain CT imaging for further evaluation.  Updated lab work imaging vital signs remained stable no complaints agreeable for discharge  Rule out Low Suspicion for lower lobe pneumonia does endorse any URI-like symptoms lung sounds are clear bilaterally.  I have low suspicion for bowel obstruction, diverticulitis, volvulus, perforated stomach ulcer, colitis Pilo, kidney stone CT imaging is all negative for these findings.  Low suspicion for mesenteric ischemia presentation atypical etiology she is not in severe amount of pain she has low risk factors for this.  low suspicion for dissection as she does not describe as a tearing like or stabbing-like sensation which radiates into her back, presentation more consistent with likely gastritis.    Dispostion and problem list  After consideration of the diagnostic results and the patients response to treatment,  I feel that the patent would benefit from discharge.  Abdominal pain improved-suspect this is likely gastritis, she is currently on a PPI, will have her take 40 mg for next week's time, recommend a bland diet follow-up with PCP as needed.  Gave strict return precautions.            Final Clinical Impression(s) / ED Diagnoses Final diagnoses:  Acute gastritis without hemorrhage, unspecified gastritis type    Rx / DC Orders ED Discharge Orders          Ordered    omeprazole (PRILOSEC) 20 MG capsule  Daily,   Status:  Discontinued        04/27/21 1217    omeprazole (PRILOSEC) 40 MG capsule  Daily        04/27/21 1218              Marcello Fennel, PA-C 04/27/21 1220    Wynona Dove A, DO 04/27/21 1612

## 2021-05-02 DIAGNOSIS — R109 Unspecified abdominal pain: Secondary | ICD-10-CM | POA: Diagnosis not present

## 2021-05-02 DIAGNOSIS — I10 Essential (primary) hypertension: Secondary | ICD-10-CM | POA: Diagnosis not present

## 2021-05-02 DIAGNOSIS — K219 Gastro-esophageal reflux disease without esophagitis: Secondary | ICD-10-CM | POA: Diagnosis not present

## 2021-05-02 DIAGNOSIS — G894 Chronic pain syndrome: Secondary | ICD-10-CM | POA: Diagnosis not present

## 2021-05-23 DIAGNOSIS — R634 Abnormal weight loss: Secondary | ICD-10-CM | POA: Diagnosis not present

## 2021-05-23 DIAGNOSIS — D510 Vitamin B12 deficiency anemia due to intrinsic factor deficiency: Secondary | ICD-10-CM | POA: Diagnosis not present

## 2021-05-23 DIAGNOSIS — E213 Hyperparathyroidism, unspecified: Secondary | ICD-10-CM | POA: Diagnosis not present

## 2021-05-23 DIAGNOSIS — E2839 Other primary ovarian failure: Secondary | ICD-10-CM | POA: Diagnosis not present

## 2021-05-23 DIAGNOSIS — E559 Vitamin D deficiency, unspecified: Secondary | ICD-10-CM | POA: Diagnosis not present

## 2021-05-23 DIAGNOSIS — R109 Unspecified abdominal pain: Secondary | ICD-10-CM | POA: Diagnosis not present

## 2021-05-23 DIAGNOSIS — Z0001 Encounter for general adult medical examination with abnormal findings: Secondary | ICD-10-CM | POA: Diagnosis not present

## 2021-05-23 DIAGNOSIS — Z1331 Encounter for screening for depression: Secondary | ICD-10-CM | POA: Diagnosis not present

## 2021-05-27 DIAGNOSIS — I10 Essential (primary) hypertension: Secondary | ICD-10-CM | POA: Diagnosis not present

## 2021-05-27 DIAGNOSIS — G43701 Chronic migraine without aura, not intractable, with status migrainosus: Secondary | ICD-10-CM | POA: Diagnosis not present

## 2021-05-27 DIAGNOSIS — G4459 Other complicated headache syndrome: Secondary | ICD-10-CM | POA: Diagnosis not present

## 2021-05-27 DIAGNOSIS — M545 Low back pain, unspecified: Secondary | ICD-10-CM | POA: Diagnosis not present

## 2021-05-27 DIAGNOSIS — K5903 Drug induced constipation: Secondary | ICD-10-CM | POA: Diagnosis not present

## 2021-05-27 DIAGNOSIS — M542 Cervicalgia: Secondary | ICD-10-CM | POA: Diagnosis not present

## 2021-05-27 DIAGNOSIS — D329 Benign neoplasm of meninges, unspecified: Secondary | ICD-10-CM | POA: Diagnosis not present

## 2021-05-27 DIAGNOSIS — Z79891 Long term (current) use of opiate analgesic: Secondary | ICD-10-CM | POA: Diagnosis not present

## 2021-05-27 DIAGNOSIS — Q8500 Neurofibromatosis, unspecified: Secondary | ICD-10-CM | POA: Diagnosis not present

## 2021-05-30 ENCOUNTER — Ambulatory Visit (INDEPENDENT_AMBULATORY_CARE_PROVIDER_SITE_OTHER): Payer: Medicare HMO | Admitting: "Endocrinology

## 2021-05-30 ENCOUNTER — Encounter: Payer: Self-pay | Admitting: "Endocrinology

## 2021-05-30 ENCOUNTER — Other Ambulatory Visit: Payer: Self-pay

## 2021-05-30 NOTE — Progress Notes (Signed)
?                                                          Endocrinology Consult Note  ?     05/30/2021, 4:46 PM ? ?Julia [name] is a 61 y.o.-year-old female, referred by her  Redmond School, MD  , for evaluation for hypercalcemia/hyperparathyroidism. ? ? ?Past Medical History:  ?Diagnosis Date  ? Anxiety   ? Brain tumor (benign) ([place])   ? Chronic pain   ? Depression   ? Elevated cholesterol   ? Headache(784.0)   ? Hypertension   ? Numerous moles   ? Seizures ([place])   ? Trichimoniasis   ? ? ?Past Surgical History:  ?Procedure Laterality Date  ? EXCISION OF SKIN TAG N/A 09/22/2017  ? Procedure: EXCISION OF SKIN TAGS ON FACE AND NECK (Procedure #2);  Surgeon: Jonnie Kind, MD;  Location: [facility];  Service: Gynecology;  Laterality: N/A;  ? HYSTEROSCOPY WITH D & C N/A 09/22/2017  ? Procedure: DILATATION AND CURETTAGE /HYSTEROSCOPY; EXCISION OF VULVAR AND RIGHT AND LEFT THIGH SKIN TAGS (Procedure #1);  Surgeon: Jonnie Kind, MD;  Location: [facility];  Service: Gynecology;  Laterality: N/A;  ? POLYPECTOMY N/A 09/22/2017  ? Procedure: REMOVAL OF ENDOMETRIAL POLYP (Procedure #1);  Surgeon: Jonnie Kind, MD;  Location: [facility];  Service: Gynecology;  Laterality: N/A;  ? TUBAL LIGATION    ? ? ?Social History  ? ?Tobacco Use  ? Smoking status: Never  ? Smokeless tobacco: Never  ?Vaping Use  ? Vaping Use: Never used  ?Substance Use Topics  ? Alcohol use: No  ? Drug use: No  ? ? ?Family History  ?Problem Relation Age of Onset  ? Stroke Mother   ? Heart failure Mother   ? Cancer Father   ?     lung  ? Diabetes Brother   ? ? ?Outpatient Encounter Medications as of 05/30/2021  ?Medication Sig  ? SIMVASTATIN PO Take by mouth daily in the afternoon.  ? albuterol (PROVENTIL HFA;VENTOLIN HFA) 108 (90 Base) MCG/ACT inhaler Inhale 1-2 puffs into the lungs every 6 (six) hours as needed for shortness of breath.  ? ALPRAZolam (XANAX) 1 MG tablet Take 1 mg by mouth 4 (four) times daily.  ? buPROPion (WELLBUTRIN SR) 150 MG 12 hr  tablet Take 150 mg by mouth 2 (two) times daily.  ? Cholecalciferol (VITAMIN D3) 50 MCG (2000 UT) CAPS Take 1 capsule by mouth daily.  ? ezetimibe (ZETIA) 10 MG tablet Take 10 mg by mouth daily.  ? furosemide (LASIX) 20 MG tablet Take 20 mg by mouth.  ? hydrochlorothiazide (MICROZIDE) 12.5 MG capsule Take 12.5 mg by mouth daily.  ? losartan (COZAAR) 50 MG tablet Take 50 mg by mouth daily.   ? oxyCODONE-acetaminophen (PERCOCET/ROXICET) 5-325 MG tablet Take by mouth 2 (two) times daily.  ? pantoprazole (PROTONIX) 40 MG tablet Take 40 mg by mouth 2 (two) times daily.  ? potassium chloride SA (KLOR-CON) 20 MEQ tablet Take 20 mEq by mouth daily.  ? promethazine (PHENERGAN) 25 MG suppository Place 1 suppository (25 mg total) rectally every 6 (six) hours as needed for nausea or vomiting.  ? [DISCONTINUED] buPROPion (WELLBUTRIN XL) 150 MG 24 hr tablet Take 150 mg by mouth daily.  ? [DISCONTINUED] omeprazole (PRILOSEC)  20 MG capsule Take 20 mg by mouth daily.  ? [DISCONTINUED] omeprazole (PRILOSEC) 40 MG capsule Take 1 capsule (40 mg total) by mouth daily for 7 days.  ? [DISCONTINUED] pravastatin (PRAVACHOL) 40 MG tablet Take 40 mg by mouth daily.  ? ?No facility-administered encounter medications on file as of 05/30/2021.  ? ? ?Allergies  ?Allergen Reactions  ? Propranolol   ? Naprosyn [Naproxen] Nausea Only  ? Penicillins Nausea And Vomiting and Other (See Comments)  ?  [name] patient had a PCN reaction causing immediate rash, facial/tongue/throat swelling, SOB or lightheadedness with hypotension: No ?[name] patient had a PCN reaction causing severe rash involving mucus membranes or skin necrosis: No ?[name] patient had a PCN reaction that required hospitalization Yes ?[name] patient had a PCN reaction occurring within the last 10 years: No ?If all of the above answers are "NO", then may proceed with Cephalosporin use. ?  ? Promethazine Hcl Nausea Only  ? ? ? ?HPI  ?Julia [name] was diagnosed with hypercalcemia in April 2022.   Patient [name] no previously known history of parathyroid, pituitary, adrenal dysfunctions; no family history of such dysfunctions. ?-Review of herreferral package of most recent labs reveals calcium of 11.9 with no corresponding PTH on April 09, 2021.  She does have elevated calcium level for at least 1 year.   ?-No recent bone density to review. ?No prior history of fragility fractures or falls. No history of  kidney stones. ?She complains of abdominal pain, GERD. ?No history of CKD.  ?she is not on HCTZ or other thiazide therapy.  No history of  vitamin D deficiency.  ? ?she is not on calcium supplements.  Her consumption of green leafy vegetables is suboptimal.  ?she does not have a family history of hypercalcemia, pituitary tumors, thyroid cancer, or osteoporosis.  ? ?I reviewed her chart and she also [name] a history of hyperlipidemia, hypertension on treatment..  ? ? ?ROS: ? ?Constitutional: no weight gain/loss, no fatigue, no subjective hyperthermia, no subjective hypothermia ?Eyes: no blurry vision, no xerophthalmia ?ENT: no sore throat, no nodules palpated in throat, no dysphagia/odynophagia, no hoarseness ?Cardiovascular: no Chest Pain, no Shortness of Breath, no palpitations, no leg swelling ?Respiratory: no cough, no shortness of breath  ?Gastrointestinal: no Nausea/Vomiting/Diarhhea ?Musculoskeletal: no muscle/joint aches ?Skin: no rashes ?Neurological: no tremors, no numbness, no tingling, no dizziness ?Psychiatric: no depression, no anxiety ? ?PE: ?Ht '5\' 1"'$  (1.549 m)   Wt 131 lb (59.4 kg)   LMP 09/06/2012   BMI 24.75 kg/m? , Body mass index is 24.75 kg/m?. ?Wt Readings from Last 3 Encounters:  ?05/30/21 131 lb (59.4 kg)  ?04/27/21 133 lb (60.3 kg)  ?04/23/21 132 lb 3.2 oz (60 kg)  ?  ?Constitutional: + BMI 24.7 , + dysphoric and temperament, not in acute distress, normal state of mind ?Eyes: PERRLA, EOMI, no exophthalmos ?ENT: moist mucous membranes, no gross thyromegaly, no gross cervical  lymphadenopathy ?Cardiovascular: normal precordial activity, Regular Rate and Rhythm, no Murmur/Rubs/Gallops ?Respiratory:  adequate breathing efforts, no gross chest deformity, Clear to auscultation bilaterally ?Gastrointestinal: abdomen soft, Non -tender, No distension, Bowel Sounds present ?Musculoskeletal: no gross deformities, strength intact in all four extremities ?Skin: moist, warm, no rashes ?Neurological: no tremor with outstretched hands, Deep tendon reflexes normal in bilateral lower extremities.   ? ? ?CMP ( most recent) ?CMP  ?   ?Component Value Date/Time  ? NA 130 (L) 04/27/2021 0959  ? K 3.8 04/27/2021 0959  ? CL 99 04/27/2021 0959  ?  CO2 22 04/27/2021 0959  ? GLUCOSE 99 04/27/2021 0959  ? BUN 11 04/27/2021 0959  ? CREATININE 0.83 04/27/2021 0959  ? CALCIUM 10.5 (H) 04/27/2021 0959  ? PROT 6.6 04/27/2021 0959  ? ALBUMIN 3.8 04/27/2021 0959  ? AST 21 04/27/2021 0959  ? ALT 28 04/27/2021 0959  ? ALKPHOS 99 04/27/2021 0959  ? BILITOT 2.2 (H) 04/27/2021 0959  ? GFRNONAA >60 04/27/2021 0959  ? GFRAA >60 09/17/2017 1323  ? ? ? ? ?Assessment: ?1. Hypercalcemia / Hyperparathyroidism ? ?Plan: ?Patient [name] had several instances of elevated calcium, with the highest level being at 11.9 mg/dL.  No corresponding PTH was reviewed.   ? ?She is on vitamin D supplement.  She complains of abdominal pain which may not be related to hypercalcemia, denies any history of osteoporosis, nephrolithiasis.   ? ? ?- I discussed with the patient and her daughter about the physiology of calcium and parathyroid hormone, and possible  effects of  increased PTH/ Calcium , including kidney stones, cardiac dysrhythmias, osteoporosis, abdominal pain, etc.  ? ?- The work up so far is not sufficient to reach a conclusion for definitive therapy.  she  needs more studies to confirm and classify the parathyroid dysfunction she may have. ?I will proceed to obtain  repeat intact PTH/calcium, serum magnesium, serum phosphorus, PTH RP.  It is  also essential to obtain 24-hour urine calcium/creatinine to rule out the rare but important cause of mild elevation in calcium and PTH- [place] ( Familial Hypocalciuric Hypercalcemia), which may not require any active interv

## 2021-06-03 ENCOUNTER — Other Ambulatory Visit: Payer: Self-pay | Admitting: "Endocrinology

## 2021-06-04 LAB — CREATININE, URINE, 24 HOUR
Creatinine, 24H Ur: 543 mg/24 hr — ABNORMAL LOW (ref 800–1800)
Creatinine, Urine: 77.5 mg/dL

## 2021-06-04 LAB — CALCIUM, URINE, 24 HOUR
Calcium, 24H Urine: 66 mg/24 hr (ref 0–320)
Calcium, Urine: 9.4 mg/dL

## 2021-06-06 ENCOUNTER — Emergency Department (HOSPITAL_COMMUNITY)
Admission: EM | Admit: 2021-06-06 | Discharge: 2021-06-06 | Disposition: A | Discharge status: Home / Self Care | Payer: Medicare HMO | Attending: Emergency Medicine | Admitting: Emergency Medicine

## 2021-06-06 ENCOUNTER — Other Ambulatory Visit: Payer: Self-pay

## 2021-06-06 ENCOUNTER — Encounter (HOSPITAL_COMMUNITY): Payer: Self-pay

## 2021-06-06 DIAGNOSIS — E876 Hypokalemia: Secondary | ICD-10-CM | POA: Insufficient documentation

## 2021-06-06 DIAGNOSIS — R202 Paresthesia of skin: Secondary | ICD-10-CM | POA: Diagnosis not present

## 2021-06-06 DIAGNOSIS — M5412 Radiculopathy, cervical region: Secondary | ICD-10-CM | POA: Insufficient documentation

## 2021-06-06 LAB — COMPREHENSIVE METABOLIC PANEL
ALT: 21 U/L (ref 0–44)
AST: 20 U/L (ref 15–41)
Albumin: 4.1 g/dL (ref 3.5–5.0)
Alkaline Phosphatase: 90 U/L (ref 38–126)
Anion gap: 7 (ref 5–15)
BUN: 15 mg/dL (ref 8–23)
CO2: 26 mmol/L (ref 22–32)
Calcium: 10.9 mg/dL — ABNORMAL HIGH (ref 8.9–10.3)
Chloride: 100 mmol/L (ref 98–111)
Creatinine, Ser: 0.92 mg/dL (ref 0.44–1.00)
GFR, Estimated: >60 mL/min (ref >60)
Glucose, Bld: 99 mg/dL (ref 70–99)
Potassium: 3.2 mmol/L — ABNORMAL LOW (ref 3.5–5.1)
Sodium: 133 mmol/L — ABNORMAL LOW (ref 135–145)
Total Bilirubin: 1.6 mg/dL — ABNORMAL HIGH (ref 0.3–1.2)
Total Protein: 7.2 g/dL (ref 6.5–8.1)

## 2021-06-06 LAB — CBC WITH DIFFERENTIAL/PLATELET
Abs Immature Granulocytes: 0.01 10*3/uL (ref 0.00–0.07)
Basophils Absolute: 0 10*3/uL (ref 0.0–0.1)
Basophils Relative: 0 %
Eosinophils Absolute: 0 10*3/uL (ref 0.0–0.5)
Eosinophils Relative: 0 %
HCT: 40 % (ref 36.0–46.0)
Hemoglobin: 13.5 g/dL (ref 12.0–15.0)
Immature Granulocytes: 0 %
Lymphocytes Relative: 23 %
Lymphs Abs: 1.3 10*3/uL (ref 0.7–4.0)
MCH: 30.9 pg (ref 26.0–34.0)
MCHC: 33.8 g/dL (ref 30.0–36.0)
MCV: 91.5 fL (ref 80.0–100.0)
Monocytes Absolute: 0.5 10*3/uL (ref 0.1–1.0)
Monocytes Relative: 8 %
Neutro Abs: 3.8 10*3/uL (ref 1.7–7.7)
Neutrophils Relative %: 69 %
Platelets: 227 10*3/uL (ref 150–400)
RBC: 4.37 MIL/uL (ref 3.87–5.11)
RDW: 12.7 % (ref 11.5–15.5)
WBC: 5.6 10*3/uL (ref 4.0–10.5)
nRBC: 0 % (ref 0.0–0.2)

## 2021-06-06 MED ORDER — METHYLPREDNISOLONE 4 MG PO TBPK: ORAL_TABLET | ORAL | 0 refills | Status: DC

## 2021-06-06 MED ORDER — POTASSIUM CHLORIDE CRYS ER 20 MEQ PO TBCR
40.0000 meq | EXTENDED_RELEASE_TABLET | Freq: Once | ORAL | Status: AC
  Administered 2021-06-06: 40 meq via ORAL
  Filled 2021-06-06: qty 2

## 2021-06-06 NOTE — ED Provider Notes (Signed)
?[place] ?Provider Note ? ? ?CSN: [id number] ?Arrival date & time: 06/06/21  1147 ? ?  ? ?History ? ?Chief Complaint  ?Patient presents with  ? Arm Pain  ? ? ?Julia [name] is a 61 y.o. female who presents to the ED today with complaint of gradual onset, constant, paresthesias to all 5 left fingers x 1 month.  Patient denies any worsening of symptoms.  She has not done anything for this in the past.  She she has not mentioned this to her PCP.  She does mention that she has a history of a "pinched nerve" in her neck.  Is also currently being worked up for elevated calcium levels.  Denies any specific arm pain despite triage report stating same.  She denies any weakness or numbness.  Denies neck pain the past month however states that it was slightly sore.  No chest pain, shortness of breath, nausea, vomiting, speech changes, left leg symptoms, or any other associated symptoms.  ? ?The history is provided by the patient and medical records.  ? ?  ? ?Home Medications ?Prior to Admission medications   ?Medication Sig Start Date End Date Taking? Authorizing Provider  ?methylPREDNISolone (MEDROL DOSEPAK) 4 MG TBPK tablet Follow package insert 06/06/21  Yes Hikari Tripp, PA-C  ?albuterol (PROVENTIL HFA;VENTOLIN HFA) 108 (90 Base) MCG/ACT inhaler Inhale 1-2 puffs into the lungs every 6 (six) hours as needed for shortness of breath. 09/18/17   [provider]  ?ALPRAZolam Duanne Moron) 1 MG tablet Take 1 mg by mouth 4 (four) times daily.    [provider]  ?buPROPion (WELLBUTRIN SR) 150 MG 12 hr tablet Take 150 mg by mouth 2 (two) times daily. 05/15/21   [provider]  ?Cholecalciferol (VITAMIN D3) 50 MCG (2000 UT) CAPS Take 1 capsule by mouth daily.    [provider]  ?ezetimibe (ZETIA) 10 MG tablet Take 10 mg by mouth daily.    [provider]  ?furosemide (LASIX) 20 MG tablet Take 20 mg by mouth.    [provider]  ?hydrochlorothiazide  (MICROZIDE) 12.5 MG capsule Take 12.5 mg by mouth daily. 05/15/21   [provider]  ?losartan (COZAAR) 50 MG tablet Take 50 mg by mouth daily.  09/01/13   [provider]  ?oxyCODONE-acetaminophen (PERCOCET/ROXICET) 5-325 MG tablet Take by mouth 2 (two) times daily. 04/27/21   [provider]  ?pantoprazole (PROTONIX) 40 MG tablet Take 40 mg by mouth 2 (two) times daily. 05/02/21   [provider]  ?potassium chloride SA (KLOR-CON) 20 MEQ tablet Take 20 mEq by mouth daily. 08/02/20   [provider]  ?promethazine (PHENERGAN) 25 MG suppository Place 1 suppository (25 mg total) rectally every 6 (six) hours as needed for nausea or vomiting. 12/11/20   Milton Ferguson, MD  ?SIMVASTATIN PO Take by mouth daily in the afternoon.    [provider]  ?   ? ?Allergies    ?Propranolol, Naprosyn [naproxen], Penicillins, and Promethazine hcl   ? ?Review of Systems   ?Review of Systems  ?Constitutional:  Negative for chills and fever.  ?Respiratory:  Negative for shortness of breath.   ?Cardiovascular:  Negative for chest pain.  ?Gastrointestinal:  Negative for nausea and vomiting.  ?Musculoskeletal:  Positive for neck pain.  ?Neurological:  Negative for dizziness, light-headedness and headaches.  ?     + paresthesias to fingers on L hand  ?All other systems reviewed and are negative. ? ?Physical Exam ?Updated Vital Signs ?  BP (!) 147/103 (BP Location: Right Arm)   Pulse 88   Temp 97.7 ?F (36.5 ?C) (Oral)   Resp 17   Ht '5\' 1"'$  (1.549 m)   Wt 59.4 kg   LMP 09/06/2012   SpO2 100%   BMI 24.75 kg/m?  ?Physical Exam ?Vitals and nursing note reviewed.  ?Constitutional:   ?   Appearance: She is not ill-appearing or diaphoretic.  ?HENT:  ?   Head: Normocephalic and atraumatic.  ?Eyes:  ?   Conjunctiva/sclera: Conjunctivae normal.  ?Neck:  ?   Comments: No specific midline C spine TTP ?Cardiovascular:  ?   Rate and Rhythm: Normal rate and regular rhythm.  ?   Pulses: Normal pulses.   ?Pulmonary:  ?   Effort: Pulmonary effort is normal.  ?   Breath sounds: Normal breath sounds. No wheezing, rhonchi or rales.  ?Abdominal:  ?   Palpations: Abdomen is soft.  ?   Tenderness: There is no abdominal tenderness.  ?Musculoskeletal:  ?   Cervical back: Neck supple. No tenderness.  ?   Comments: No overlying skin changes noted to LUE. Sensation intact throughout. Grip strength 5/5 bilaterally. 2+ radial pulse. Negative Tinel's and Phalen's.   ?Skin: ?   General: Skin is warm and dry.  ?Neurological:  ?   Mental Status: She is alert.  ? ? ?ED Results / Procedures / Treatments   ?Labs ?(all labs ordered are listed, but only abnormal results are displayed) ?Labs Reviewed  ?COMPREHENSIVE METABOLIC PANEL - Abnormal; Notable for the following components:  ?    Result Value  ? Sodium 133 (*)   ? Potassium 3.2 (*)   ? Calcium 10.9 (*)   ? Total Bilirubin 1.6 (*)   ? All other components within normal limits  ?CBC WITH DIFFERENTIAL/PLATELET  ? ? ?EKG ?None ? ?Radiology ?No results found. ? ?Procedures ?Procedures  ? ? ?Medications Ordered in ED ?Medications  ?potassium chloride SA (KLOR-CON M) CR tablet 40 mEq (40 mEq Oral Given 06/06/21 1422)  ? ? ?ED Course/ Medical Decision Making/ A&P ?  ?                        ?Medical Decision Making ?61 year old female who presents to the ED today with complaint of all the fingers on her left hand tingling for the past 1 month.  No other symptoms at this time.  Reports history of pinched nerve in the neck.  On arrival to the ED vitals are stable, patient appears to be in no acute distress.  She is neurovascularly intact throughout.  He is right-hand dominant.  Denies any frequent overuse with her hands.  Negative Phalen's and Tinel's at this time.  No obvious midline C-spine tenderness palpation however does report her neck is mildly sore today.  M unable to see in the chart images confirming pinched nerve.  Question cervical radiculopathy causing symptoms at this time.   Patient is also currently being worked up for hypercalcemia however would suspect hypocalcemia causing tingling.  We will plan for labs at this time.  I do not feel patient requires any imaging today.  If lab work is reassuring we will plan to discharge home with Medrol Dosepak.  ? ?Workup overall reassuring besides mildly low K. Have repleted in the ED. Pt should have this rechecked with PCP in 1-2 weeks. Calcium still elevated at 10.9; unchanged from previous. Do not feel pt requires additional labs or imaging at this time.  Will discharge home with prednisone and PCP follow up. Pt in agreement with plan and stable for discharge home.  ? ?Amount and/or Complexity of Data Reviewed ?Labs: ordered. ?   Details: CBC without leukocytosis. Hgb stable at 13.5 ?CMP with potassium 3.2; will replete in the ED. Calcium 10.9; appears unchanged from previous. Na 133. No other electrolyte abnormalities. ? ?Risk ?Prescription drug management. ? ? ? ? ? ? ? ? ? ?Final Clinical Impression(s) / ED Diagnoses ?Final diagnoses:  ?Paresthesias  ?Hypokalemia  ?Cervical radiculopathy  ? ? ?Rx / DC Orders ?ED Discharge Orders   ? ?      Ordered  ?  methylPREDNISolone (MEDROL DOSEPAK) 4 MG TBPK tablet       ? 06/06/21 1500  ? ?  ?  ? ?  ? ? ? ?Discharge Instructions   ? ?  ?Please pick up medication and take as prescribed ? ?Follow up with your PCP Dr. Gerarda Fraction for further evaluation of your ongoing symptoms. Your potassium was slightly low today and we have repleted it in the ED. This should be rechecked by your PCP in 1-2 weeks.  ? ?Return to the ED for any new/worsening symptoms ? ? ? ? ?  ?Eustaquio Maize, PA-C ?06/06/21 1501 ? ?  ?Milton Ferguson, MD ?06/08/21 [phone] ? ?

## 2021-06-06 NOTE — ED Notes (Signed)
Pt requesting Sprite, waiting for blood work to result at this time. Pt verbalized understanding.  ?

## 2021-06-06 NOTE — ED Triage Notes (Addendum)
Pt ambulatory to triage with steady gait.  Reports has L hand numbness that started this morning and L arm pain.  Reports intermittent dizziness that has resolved.  Reports hx of anxiety. Resp even and unlabored.  Denies cp.  Denies sob at this time.  Reports normal appetite.   ?

## 2021-06-06 NOTE — Discharge Instructions (Signed)
Please pick up medication and take as prescribed ? ?Follow up with your PCP Dr. Gerarda Fraction for further evaluation of your ongoing symptoms. Your potassium was slightly low today and we have repleted it in the ED. This should be rechecked by your PCP in 1-2 weeks.  ? ?Return to the ED for any new/worsening symptoms ?

## 2021-06-07 LAB — MAGNESIUM: Magnesium: 2 mg/dL (ref 1.6–2.3)

## 2021-06-07 LAB — PTH-RELATED PEPTIDE: PTH-related peptide: <2 pmol/L

## 2021-06-07 LAB — PTH, INTACT AND CALCIUM
Calcium: 11.5 mg/dL — ABNORMAL HIGH (ref 8.7–10.3)
PTH: 97 pg/mL — ABNORMAL HIGH (ref 15–65)

## 2021-06-07 LAB — PHOSPHORUS: Phosphorus: 2.8 mg/dL — ABNORMAL LOW (ref 3.0–4.3)

## 2021-06-11 DIAGNOSIS — E213 Hyperparathyroidism, unspecified: Secondary | ICD-10-CM | POA: Diagnosis not present

## 2021-06-11 DIAGNOSIS — K5792 Diverticulitis of intestine, part unspecified, without perforation or abscess without bleeding: Secondary | ICD-10-CM | POA: Diagnosis not present

## 2021-06-11 DIAGNOSIS — Z6825 Body mass index (BMI) 25.0-25.9, adult: Secondary | ICD-10-CM | POA: Diagnosis not present

## 2021-06-11 DIAGNOSIS — E876 Hypokalemia: Secondary | ICD-10-CM | POA: Diagnosis not present

## 2021-06-11 DIAGNOSIS — E663 Overweight: Secondary | ICD-10-CM | POA: Diagnosis not present

## 2021-06-12 ENCOUNTER — Telehealth: Payer: Self-pay | Admitting: "Endocrinology

## 2021-06-12 NOTE — Telephone Encounter (Signed)
Scheduled for 06-18-21 at 11:00. Pt notified.  ?

## 2021-06-12 NOTE — Telephone Encounter (Signed)
Pt has an appt next week. I do not see she has been scheduled for her scan.  ?

## 2021-06-18 ENCOUNTER — Ambulatory Visit (HOSPITAL_COMMUNITY)
Admission: RE | Admit: 2021-06-18 | Discharge: 2021-06-18 | Disposition: A | Discharge status: Home / Self Care | Payer: Medicare HMO | Source: Ambulatory Visit | Attending: "Endocrinology | Admitting: "Endocrinology

## 2021-06-18 DIAGNOSIS — Z780 Asymptomatic menopausal state: Secondary | ICD-10-CM | POA: Insufficient documentation

## 2021-06-18 DIAGNOSIS — Z13820 Encounter for screening for osteoporosis: Secondary | ICD-10-CM | POA: Insufficient documentation

## 2021-06-18 DIAGNOSIS — M810 Age-related osteoporosis without current pathological fracture: Secondary | ICD-10-CM | POA: Insufficient documentation

## 2021-06-18 DIAGNOSIS — M8589 Other specified disorders of bone density and structure, multiple sites: Secondary | ICD-10-CM | POA: Diagnosis not present

## 2021-06-20 ENCOUNTER — Ambulatory Visit (INDEPENDENT_AMBULATORY_CARE_PROVIDER_SITE_OTHER): Payer: Medicare HMO | Admitting: "Endocrinology

## 2021-06-20 ENCOUNTER — Encounter: Payer: Self-pay | Admitting: "Endocrinology

## 2021-06-20 DIAGNOSIS — E212 Other hyperparathyroidism: Secondary | ICD-10-CM | POA: Diagnosis not present

## 2021-06-20 DIAGNOSIS — M816 Localized osteoporosis [Lequesne]: Secondary | ICD-10-CM | POA: Diagnosis not present

## 2021-06-20 MED ORDER — ALENDRONATE SODIUM 70 MG PO TABS: 70.0000 mg | ORAL_TABLET | ORAL | 11 refills | Status: DC

## 2021-06-20 NOTE — Progress Notes (Signed)
?                                               ?  ?    06/20/2021, 12:01 PM ? ? ?Endocrinology follow-up note ? ?Julia [name] is a 61 y.o.-year-old female, referred by her  [employer] School, MD  , for evaluation for hypercalcemia/hyperparathyroidism. ? ?She is returning to discuss her recent labs and bone density. ?Past Medical History:  ?Diagnosis Date  ? Anxiety   ? Brain tumor (benign) ([place])   ? Chronic pain   ? Depression   ? Elevated cholesterol   ? Headache(784.0)   ? Hypertension   ? Numerous moles   ? Seizures ([place])   ? Trichimoniasis   ? ? ?Past Surgical History:  ?Procedure Laterality Date  ? EXCISION OF SKIN TAG N/A 09/22/2017  ? Procedure: EXCISION OF SKIN TAGS ON FACE AND NECK (Procedure #2);  Surgeon: Jonnie Kind, MD;  Location: [facility];  Service: Gynecology;  Laterality: N/A;  ? HYSTEROSCOPY WITH D & C N/A 09/22/2017  ? Procedure: DILATATION AND CURETTAGE /HYSTEROSCOPY; EXCISION OF VULVAR AND RIGHT AND LEFT THIGH SKIN TAGS (Procedure #1);  Surgeon: Jonnie Kind, MD;  Location: [facility];  Service: Gynecology;  Laterality: N/A;  ? POLYPECTOMY N/A 09/22/2017  ? Procedure: REMOVAL OF ENDOMETRIAL POLYP (Procedure #1);  Surgeon: Jonnie Kind, MD;  Location: [facility];  Service: Gynecology;  Laterality: N/A;  ? TUBAL LIGATION    ? ? ?Social History  ? ?Tobacco Use  ? Smoking status: Never  ? Smokeless tobacco: Never  ?Vaping Use  ? Vaping Use: Never used  ?Substance Use Topics  ? Alcohol use: No  ? Drug use: No  ? ? ?Family History  ?Problem Relation Age of Onset  ? Stroke Mother   ? Heart failure Mother   ? Cancer Father   ?     lung  ? Diabetes Brother   ? ? ?Outpatient Encounter Medications as of 06/20/2021  ?Medication Sig  ? alendronate (FOSAMAX) 70 MG tablet Take 1 tablet (70 mg total) by mouth every 7 (seven) days. Take with a full glass of water on an empty stomach.  ? Magnesium 200 MG TABS Take 1 tablet by mouth 2 (two) times daily.  ? Menaquinone-7 (VITAMIN K2 PO) Take 1 tablet by mouth  daily.  ? albuterol (PROVENTIL HFA;VENTOLIN HFA) 108 (90 Base) MCG/ACT inhaler Inhale 1-2 puffs into the lungs every 6 (six) hours as needed for shortness of breath.  ? ALPRAZolam (XANAX) 1 MG tablet Take 1 mg by mouth 4 (four) times daily.  ? buPROPion (WELLBUTRIN SR) 150 MG 12 hr tablet Take 150 mg by mouth 2 (two) times daily.  ? Cholecalciferol (VITAMIN D3) 50 MCG (2000 UT) CAPS Take 1 capsule by mouth daily.  ? ezetimibe (ZETIA) 10 MG tablet Take 10 mg by mouth daily.  ? furosemide (LASIX) 20 MG tablet Take 20 mg by mouth.  ? hydrochlorothiazide (MICROZIDE) 12.5 MG capsule Take 12.5 mg by mouth daily.  ? losartan (COZAAR) 50 MG tablet Take 50 mg by mouth daily.   ? oxyCODONE-acetaminophen (PERCOCET/ROXICET) 5-325 MG tablet Take by mouth 2 (two) times daily.  ? pantoprazole (PROTONIX) 40 MG tablet Take 40 mg by mouth 2 (two) times daily.  ? potassium chloride SA (KLOR-CON) 20 MEQ tablet Take 20 mEq by mouth  daily.  ? pravastatin (PRAVACHOL) 40 MG tablet Take 40 mg by mouth daily.  ? promethazine (PHENERGAN) 25 MG suppository Place 1 suppository (25 mg total) rectally every 6 (six) hours as needed for nausea or vomiting.  ? [DISCONTINUED] methylPREDNISolone (MEDROL DOSEPAK) 4 MG TBPK tablet Follow package insert  ? [DISCONTINUED] SIMVASTATIN PO Take by mouth daily in the afternoon.  ? ?No facility-administered encounter medications on file as of 06/20/2021.  ? ? ?Allergies  ?Allergen Reactions  ? Propranolol   ? Naprosyn [Naproxen] Nausea Only  ? Penicillins Nausea And Vomiting and Other (See Comments)  ?  Has patient had a PCN reaction causing immediate rash, facial/tongue/throat swelling, SOB or lightheadedness with hypotension: No ?Has patient had a PCN reaction causing severe rash involving mucus membranes or skin necrosis: No ?Has patient had a PCN reaction that required hospitalization Yes ?Has patient had a PCN reaction occurring within the last 10 years: No ?If all of the above answers are "NO", then may  proceed with Cephalosporin use. ?  ? Promethazine Hcl Nausea Only  ? ? ? ?HPI  ?Farris Has was diagnosed with hypercalcemia in April 2022.  Patient has no previously known history of parathyroid, pituitary, adrenal dysfunctions; no family history of such dysfunctions. ?-Review of herreferral package of most recent labs reveals calcium of 11.9 on May 30, 2021 with a corresponding PTH of 97, elevated.   ?She did have elevated calcium level for at least 1 year.   ?Her repeat labs show calcium slightly better at 10.9.  24-hour urine calcium however was not elevated, at 66.  There is a question of whether she provided enough urine since 24-hour urine creatinine was low at 543. ?-Her recent bone density shows osteoporosis on upper extremities.  Osteopenia on the spine and hips. ?No prior history of fragility fractures or falls. No history of  kidney stones. ?She complains of abdominal pain, GERD. ?No history of CKD.  ?she is not on HCTZ or other thiazide therapy.  No history of  vitamin D deficiency.  ? ?she is not on calcium supplements.  Her consumption of green leafy vegetables is suboptimal.  ?she does not have a family history of hypercalcemia, pituitary tumors, thyroid cancer, or osteoporosis.  ? ?I reviewed her chart and she also has a history of hyperlipidemia, hypertension on treatment..  ? ? ?ROS: ? ?Constitutional: no weight gain/loss, no fatigue, no subjective hyperthermia, no subjective hypothermia ?Eyes: no blurry vision, no xerophthalmia ?ENT: no sore throat, no nodules palpated in throat, no dysphagia/odynophagia, no hoarseness ?Cardiovascular: no Chest Pain, no Shortness of Breath, no palpitations, no leg swelling ?Respiratory: no cough, no shortness of breath  ?Gastrointestinal: no Nausea/Vomiting/Diarhhea ?Musculoskeletal: no muscle/joint aches ?Skin: no rashes ?Neurological: no tremors, no numbness, no tingling, no dizziness ?Psychiatric: no depression, no anxiety ? ?PE: ?BP 122/86   Pulse 84    Ht '5\' 1"'$  (1.549 m)   Wt 130 lb (59 kg)   LMP 09/06/2012   BMI 24.56 kg/m? , Body mass index is 24.56 kg/m?. ?Wt Readings from Last 3 Encounters:  ?06/20/21 130 lb (59 kg)  ?06/06/21 131 lb (59.4 kg)  ?05/30/21 131 lb (59.4 kg)  ?  ?Constitutional: + BMI 24.7 , + dysphoric and temperament, not in acute distress, normal state of mind ?Eyes: PERRLA, EOMI, no exophthalmos ?ENT: moist mucous membranes, no gross thyromegaly, no gross cervical lymphadenopathy ? ? ? ?CMP ( most recent) ?CMP  ?   ?Component Value Date/Time  ? NA 133 (L)  06/06/2021 1331  ? K 3.2 (L) 06/06/2021 1331  ? CL 100 06/06/2021 1331  ? CO2 26 06/06/2021 1331  ? GLUCOSE 99 06/06/2021 1331  ? BUN 15 06/06/2021 1331  ? CREATININE 0.92 06/06/2021 1331  ? CALCIUM 10.9 (H) 06/06/2021 1331  ? PROT 7.2 06/06/2021 1331  ? ALBUMIN 4.1 06/06/2021 1331  ? AST 20 06/06/2021 1331  ? ALT 21 06/06/2021 1331  ? ALKPHOS 90 06/06/2021 1331  ? BILITOT 1.6 (H) 06/06/2021 1331  ? GFRNONAA >60 06/06/2021 1331  ? GFRAA >60 09/17/2017 1323  ? ? ? ? ?Assessment: ?1. Hypercalcemia / Hyperparathyroidism ?2.  Osteoporosis ? ?Plan: ?Patient has had several instances of elevated calcium, with the highest level being at 11.9 mg/dL, corresponding PTH of 97-elevated. ?She does have history of vitamin D deficiency currently on vitamin D supplement. ? She complains of abdominal pain which may not be related to hypercalcemia, nephrolithiasis. ?Possible complication seems to be osteoporosis specifically of upper extremities.  She also has osteopenia on the spine as well as hips.  She would benefit from antiresorptive treatment options.  I discussed and initiated alendronate 70 mg p.o. weekly.  Side effects and precautions discussed with her. ? ? ?She will be put on observation only approach for hypercalcemia.  This may respond to alendronate treatment as well. ?She will have repeat 24-hour urine study as well as PTH/calcium before next visit in 6 months. ? ?-If her next labs show  significant hypercalcemia from primary hyperparathyroidism, she will be considered for surgical intervention. ? ?She is advised to maintain close follow-up with her PMD. ? ?I spent 31 minutes in the care of th

## 2021-06-25 ENCOUNTER — Emergency Department (HOSPITAL_COMMUNITY): Payer: Medicare HMO

## 2021-06-25 ENCOUNTER — Emergency Department (HOSPITAL_COMMUNITY)
Admission: EM | Admit: 2021-06-25 | Discharge: 2021-06-25 | Disposition: A | Discharge status: Home / Self Care | Payer: Medicare HMO | Attending: Emergency Medicine | Admitting: Emergency Medicine

## 2021-06-25 ENCOUNTER — Encounter (HOSPITAL_COMMUNITY): Payer: Self-pay

## 2021-06-25 ENCOUNTER — Other Ambulatory Visit: Payer: Self-pay

## 2021-06-25 DIAGNOSIS — I10 Essential (primary) hypertension: Secondary | ICD-10-CM | POA: Diagnosis not present

## 2021-06-25 DIAGNOSIS — R1084 Generalized abdominal pain: Secondary | ICD-10-CM | POA: Diagnosis not present

## 2021-06-25 DIAGNOSIS — R1010 Upper abdominal pain, unspecified: Secondary | ICD-10-CM | POA: Diagnosis not present

## 2021-06-25 DIAGNOSIS — R1031 Right lower quadrant pain: Secondary | ICD-10-CM | POA: Diagnosis present

## 2021-06-25 LAB — CBC
HCT: 41.2 % (ref 36.0–46.0)
Hemoglobin: 13.6 g/dL (ref 12.0–15.0)
MCH: 29.7 pg (ref 26.0–34.0)
MCHC: 33 g/dL (ref 30.0–36.0)
MCV: 90 fL (ref 80.0–100.0)
Platelets: 248 10*3/uL (ref 150–400)
RBC: 4.58 MIL/uL (ref 3.87–5.11)
RDW: 12.6 % (ref 11.5–15.5)
WBC: 5.6 10*3/uL (ref 4.0–10.5)
nRBC: 0 % (ref 0.0–0.2)

## 2021-06-25 LAB — COMPREHENSIVE METABOLIC PANEL
ALT: 27 U/L (ref 0–44)
AST: 24 U/L (ref 15–41)
Albumin: 4.2 g/dL (ref 3.5–5.0)
Alkaline Phosphatase: 90 U/L (ref 38–126)
Anion gap: 6 (ref 5–15)
BUN: 9 mg/dL (ref 8–23)
CO2: 26 mmol/L (ref 22–32)
Calcium: 10.8 mg/dL — ABNORMAL HIGH (ref 8.9–10.3)
Chloride: 101 mmol/L (ref 98–111)
Creatinine, Ser: 0.78 mg/dL (ref 0.44–1.00)
GFR, Estimated: >60 mL/min (ref >60)
Glucose, Bld: 94 mg/dL (ref 70–99)
Potassium: 3.5 mmol/L (ref 3.5–5.1)
Sodium: 133 mmol/L — ABNORMAL LOW (ref 135–145)
Total Bilirubin: 1.7 mg/dL — ABNORMAL HIGH (ref 0.3–1.2)
Total Protein: 7.1 g/dL (ref 6.5–8.1)

## 2021-06-25 LAB — URINALYSIS, ROUTINE W REFLEX MICROSCOPIC
Bilirubin Urine: NEGATIVE
Glucose, UA: NEGATIVE mg/dL
Hgb urine dipstick: NEGATIVE
Ketones, ur: NEGATIVE mg/dL
Leukocytes,Ua: NEGATIVE
Nitrite: NEGATIVE
Protein, ur: NEGATIVE mg/dL
Specific Gravity, Urine: 1.005 (ref 1.005–1.030)
pH: 8 (ref 5.0–8.0)

## 2021-06-25 LAB — LIPASE, BLOOD: Lipase: 33 U/L (ref 11–51)

## 2021-06-25 MED ORDER — DICYCLOMINE HCL 10 MG PO CAPS
10.0000 mg | ORAL_CAPSULE | Freq: Once | ORAL | Status: AC
  Administered 2021-06-25: 10 mg via ORAL
  Filled 2021-06-25: qty 1

## 2021-06-25 MED ORDER — DICYCLOMINE HCL 20 MG PO TABS: 20.0000 mg | ORAL_TABLET | Freq: Two times a day (BID) | ORAL | 0 refills | Status: DC

## 2021-06-25 NOTE — ED Provider Notes (Signed)
?[facility] ?Provider Note ? ? ?CSN: [id number] ?Arrival date & time: 06/25/21  2778 ? ?  ? ?History ? ?Chief Complaint  ?Patient presents with  ? Abdominal Pain  ? ? ?Julia [name] is a 61 y.o. female. ? ? ?Abdominal Pain ?Associated symptoms: no chest pain, no diarrhea, no hematuria, no nausea, no shortness of breath and no vomiting   ? ?  ? ? ?Julia [name] is a 61 y.o. female with past medical history of hypertension, seizures, anxiety and chronic pain who presents to the Emergency Department complaining of worsening diffuse abdominal pain since last evening.  She states that she has a burning pain of her abdomen.  She has been seen by gastroenterology and had a colonoscopy and endoscopy in January without definite cause of her abdominal pain.  Last evening she complained of worsening pain of her entire abdomen but had a brief episode of pain to her right lower abdomen that spontaneously resolved.  She has GI follow-up appointment May 3, but came in this morning due to her increased pain.  She is taking pantoprazole without relief.  Denies any vomiting, diarrhea, fever or chills.  No dysuria or flank pain.  Also denies any chest pain or shortness of breath. ? ? ?Home Medications ?Prior to Admission medications   ?Medication Sig Start Date End Date Taking? Authorizing Provider  ?albuterol (PROVENTIL HFA;VENTOLIN HFA) 108 (90 Base) MCG/ACT inhaler Inhale 1-2 puffs into the lungs every 6 (six) hours as needed for shortness of breath. 09/18/17   [provider]  ?alendronate (FOSAMAX) 70 MG tablet Take 1 tablet (70 mg total) by mouth every 7 (seven) days. Take with a full glass of water on an empty stomach. 06/20/21   Cassandria Anger, MD  ?ALPRAZolam Duanne Moron) 1 MG tablet Take 1 mg by mouth 4 (four) times daily.    [provider]  ?buPROPion (WELLBUTRIN SR) 150 MG 12 hr tablet Take 150 mg by mouth 2 (two) times daily. 05/15/21   [provider]  ?Cholecalciferol  (VITAMIN D3) 50 MCG (2000 UT) CAPS Take 1 capsule by mouth daily.    [provider]  ?ezetimibe (ZETIA) 10 MG tablet Take 10 mg by mouth daily.    [provider]  ?furosemide (LASIX) 20 MG tablet Take 20 mg by mouth.    [provider]  ?hydrochlorothiazide (MICROZIDE) 12.5 MG capsule Take 12.5 mg by mouth daily. 05/15/21   [provider]  ?losartan (COZAAR) 50 MG tablet Take 50 mg by mouth daily.  09/01/13   [provider]  ?Magnesium 200 MG TABS Take 1 tablet by mouth 2 (two) times daily.    [provider]  ?Menaquinone-7 (VITAMIN K2 PO) Take 1 tablet by mouth daily.    [provider]  ?oxyCODONE-acetaminophen (PERCOCET/ROXICET) 5-325 MG tablet Take by mouth 2 (two) times daily. 04/27/21   [provider]  ?pantoprazole (PROTONIX) 40 MG tablet Take 40 mg by mouth 2 (two) times daily. 05/02/21   [provider]  ?potassium chloride SA (KLOR-CON) 20 MEQ tablet Take 20 mEq by mouth daily. 08/02/20   [provider]  ?pravastatin (PRAVACHOL) 40 MG tablet Take 40 mg by mouth daily. 06/03/21   [provider]  ?promethazine (PHENERGAN) 25 MG suppository Place 1 suppository (25 mg total) rectally every 6 (six) hours as needed for nausea or vomiting. 12/11/20   Milton Ferguson, MD  ?   ? ?Allergies    ?Propranolol, Naprosyn [naproxen], Penicillins,  and Promethazine hcl   ? ?Review of Systems   ?Review of Systems  ?Respiratory:  Negative for chest tightness and shortness of breath.   ?Cardiovascular:  Negative for chest pain.  ?Gastrointestinal:  Positive for abdominal pain. Negative for diarrhea, nausea and vomiting.  ?Genitourinary:  Negative for difficulty urinating, flank pain and hematuria.  ?All other systems reviewed and are negative. ? ?Physical Exam ?Updated Vital Signs ?BP (!) 149/112 (BP Location: Right Arm)   Pulse 85   Temp 98.1 ?F (36.7 ?C) (Oral)   Resp 18   Ht '5\' 1"'$  (1.549 m)   Wt 59 kg   LMP 09/06/2012    SpO2 100%   BMI 24.56 kg/m?  ?Physical Exam ?Vitals and nursing note reviewed.  ?Constitutional:   ?   General: She is not in acute distress. ?   Appearance: She is well-developed. She is not toxic-appearing.  ?Cardiovascular:  ?   Rate and Rhythm: Normal rate and regular rhythm.  ?   Pulses: Normal pulses.  ?Pulmonary:  ?   Effort: Pulmonary effort is normal.  ?   Breath sounds: Normal breath sounds.  ?Chest:  ?   Chest wall: No tenderness.  ?Abdominal:  ?   General: There is no distension.  ?   Palpations: Abdomen is soft. There is no mass.  ?   Tenderness: There is no abdominal tenderness. There is no right CVA tenderness, left CVA tenderness or guarding. Negative signs include McBurney's sign.  ?Musculoskeletal:     ?   General: Normal range of motion.  ?   Right lower leg: No edema.  ?   Left lower leg: No edema.  ?Skin: ?   General: Skin is warm.  ?   Capillary Refill: Capillary refill takes less than 2 seconds.  ?   Findings: No erythema or rash.  ?Neurological:  ?   General: No focal deficit present.  ?   Mental Status: She is alert.  ?   Sensory: No sensory deficit.  ?   Motor: No weakness.  ? ? ?ED Results / Procedures / Treatments   ?Labs ?(all labs ordered are listed, but only abnormal results are displayed) ?Labs Reviewed  ?COMPREHENSIVE METABOLIC PANEL - Abnormal; Notable for the following components:  ?    Result Value  ? Sodium 133 (*)   ? Calcium 10.8 (*)   ? Total Bilirubin 1.7 (*)   ? All other components within normal limits  ?URINALYSIS, ROUTINE W REFLEX MICROSCOPIC - Abnormal; Notable for the following components:  ? Color, Urine STRAW (*)   ? All other components within normal limits  ?LIPASE, BLOOD  ?CBC  ? ? ?EKG ?None ? ?Radiology ?US Abdomen Limited ? ?Result Date: 06/25/2021 ?CLINICAL DATA:  Upper abdominal pain. EXAM: ULTRASOUND ABDOMEN LIMITED RIGHT UPPER QUADRANT COMPARISON:  CT scan of April 27, 2021. Ultrasound of December 09, 2018. FINDINGS: Gallbladder: No gallstones or wall  thickening visualized. No sonographic Murphy sign noted by sonographer. Common bile duct: Diameter: 3 mm which is within normal limits. Liver: Increased echogenicity of hepatic parenchyma is noted suggesting hepatic steatosis. 8 mm right hepatic cyst is noted. Portal vein is patent on color Doppler imaging with normal direction of blood flow towards the liver. Other: None. IMPRESSION: Increased echogenicity of hepatic parenchyma is noted suggesting hepatic steatosis. Probable right hepatic cyst is noted. Electronically Signed   By: Marijo Conception M.D.   On: 06/25/2021 13:36   ? ?Procedures ?Procedures  ? ? ?Medications Ordered  in ED ?Medications  ?dicyclomine (BENTYL) capsule 10 mg (has no administration in time range)  ? ? ?ED Course/ Medical Decision Making/ A&P ?  ?                        ?Medical Decision Making ?Patient here with ongoing history of abdominal pain x1 year.  Here today with worsening pain since last evening.  She describes a burning diffuse pain of her abdomen with a brief episode of right lower quadrant pain last evening.  Pain of the right lower quadrant has since resolved.  No vomiting fever or chills.  No dysuria symptoms.  He is currently followed by GI and has follow-up appointment for May 3 ? ?On exam, patient well-appearing nontoxic.  Mildly hypertensive.  Abdomen is soft and nontender on my exam.  She is currently taking a PPI which she states is not helping her symptoms.  I doubt this is acute abdomen, but will order labs and ultrasound to further evaluate possible gallbladder disease.  I suspect she will be discharged home. ? ? ? ?Amount and/or Complexity of Data Reviewed ?External Data Reviewed: notes. ?   Details: Prior medical records reviewed by me including recent CT imaging of the abdomen pelvis. ?Labs: ordered. ?   Details: Labs today interpreted by me show no evidence of leukocytosis.  Chemistries show mildly elevated total bilirubin which appears to be near baseline.  Lipase  unremarkable.  Urinalysis without evidence of infection or hematuria. ?Radiology: ordered. ?   Details: Ultrasound of the right upper quadrant without evidence of acute gallbladder disease or cholelithiasis. ? ?Ri

## 2021-06-25 NOTE — ED Notes (Signed)
Crackers and gingerale consumed by patient. No n/v. No signs of distress.  ?

## 2021-06-25 NOTE — ED Notes (Signed)
Ambulatory to the bathroom. Gait steady. No signs of distress.  ?

## 2021-06-25 NOTE — ED Triage Notes (Signed)
"  Abdominal pain that has been going on for over a year and they are trying to see what is causing it. I saw a specialist and had a colonoscopy and I am supposed to follow up with them May 3rd, but last night my abdomen was hurting again and then this morning I got upset and had a panic attack and it made it worse" per pt  Denies n/v/d. Denies cp or sob ?

## 2021-06-25 NOTE — Discharge Instructions (Signed)
Take the Bentyl as directed, continue taking your pantoprazole.  Call your GI provider to arrange a follow-up appointment. ?

## 2021-06-30 ENCOUNTER — Emergency Department (HOSPITAL_COMMUNITY)
Admission: EM | Admit: 2021-06-30 | Discharge: 2021-07-01 | Disposition: A | Discharge status: Home / Self Care | Payer: Medicare HMO | Attending: Emergency Medicine | Admitting: Emergency Medicine

## 2021-06-30 ENCOUNTER — Encounter (HOSPITAL_COMMUNITY): Payer: Self-pay

## 2021-06-30 ENCOUNTER — Other Ambulatory Visit: Payer: Self-pay

## 2021-06-30 DIAGNOSIS — M79602 Pain in left arm: Secondary | ICD-10-CM | POA: Diagnosis not present

## 2021-06-30 DIAGNOSIS — R110 Nausea: Secondary | ICD-10-CM | POA: Insufficient documentation

## 2021-06-30 DIAGNOSIS — Z86011 Personal history of benign neoplasm of the brain: Secondary | ICD-10-CM | POA: Diagnosis not present

## 2021-06-30 DIAGNOSIS — I10 Essential (primary) hypertension: Secondary | ICD-10-CM | POA: Insufficient documentation

## 2021-06-30 LAB — COMPREHENSIVE METABOLIC PANEL
ALT: 22 U/L (ref 0–44)
AST: 21 U/L (ref 15–41)
Albumin: 3.8 g/dL (ref 3.5–5.0)
Alkaline Phosphatase: 82 U/L (ref 38–126)
Anion gap: 7 (ref 5–15)
BUN: 11 mg/dL (ref 8–23)
CO2: 26 mmol/L (ref 22–32)
Calcium: 10.4 mg/dL — ABNORMAL HIGH (ref 8.9–10.3)
Chloride: 96 mmol/L — ABNORMAL LOW (ref 98–111)
Creatinine, Ser: 0.73 mg/dL (ref 0.44–1.00)
GFR, Estimated: >60 mL/min (ref >60)
Glucose, Bld: 100 mg/dL — ABNORMAL HIGH (ref 70–99)
Potassium: 3.4 mmol/L — ABNORMAL LOW (ref 3.5–5.1)
Sodium: 129 mmol/L — ABNORMAL LOW (ref 135–145)
Total Bilirubin: 1.4 mg/dL — ABNORMAL HIGH (ref 0.3–1.2)
Total Protein: 6.4 g/dL — ABNORMAL LOW (ref 6.5–8.1)

## 2021-06-30 LAB — URINALYSIS, ROUTINE W REFLEX MICROSCOPIC
Bilirubin Urine: NEGATIVE
Glucose, UA: NEGATIVE mg/dL
Hgb urine dipstick: NEGATIVE
Ketones, ur: NEGATIVE mg/dL
Nitrite: NEGATIVE
Protein, ur: NEGATIVE mg/dL
Specific Gravity, Urine: 1.005 (ref 1.005–1.030)
pH: 7 (ref 5.0–8.0)

## 2021-06-30 LAB — CBC
HCT: 37.2 % (ref 36.0–46.0)
Hemoglobin: 13 g/dL (ref 12.0–15.0)
MCH: 30.5 pg (ref 26.0–34.0)
MCHC: 34.9 g/dL (ref 30.0–36.0)
MCV: 87.3 fL (ref 80.0–100.0)
Platelets: 231 10*3/uL (ref 150–400)
RBC: 4.26 MIL/uL (ref 3.87–5.11)
RDW: 12.4 % (ref 11.5–15.5)
WBC: 5.9 10*3/uL (ref 4.0–10.5)
nRBC: 0 % (ref 0.0–0.2)

## 2021-06-30 LAB — LIPASE, BLOOD: Lipase: 34 U/L (ref 11–51)

## 2021-06-30 LAB — TROPONIN I (HIGH SENSITIVITY): Troponin I (High Sensitivity): <2 ng/L (ref <18)

## 2021-06-30 MED ORDER — ONDANSETRON 4 MG PO TBDP
4.0000 mg | ORAL_TABLET | Freq: Once | ORAL | Status: AC
Start: 2021-06-30 — End: 2021-06-30
  Administered 2021-06-30: 4 mg via ORAL
  Filled 2021-06-30: qty 1

## 2021-06-30 MED ORDER — ONDANSETRON 4 MG PO TBDP: 4.0000 mg | ORAL_TABLET | Freq: Once | ORAL | Status: DC | PRN

## 2021-06-30 NOTE — ED Triage Notes (Signed)
Pt c/o N&V since last night, denies abdominal pain. Pt states she has anxiety and thinks that may be causing her nausea.  ?

## 2021-06-30 NOTE — ED Notes (Signed)
Offered pt Zofran but pt declined stating she had already taken one. Pt states she has not been vomiting. Just feels nauseated. ?

## 2021-06-30 NOTE — ED Provider Notes (Signed)
?[facility] DEPT ?[facility] Emergency Department ?Provider Note ?MRN:  [id number]  ?Arrival date & time: 07/01/21    ? ?Chief Complaint   ?Nausea ?History of Present Illness   ?Julia [name] is a 61 y.o. year-old female with a history of hypertension presenting to the ED with chief complaint of nausea. ? ?Persistent nausea for the past few weeks.  Some left arm pain lasting several minutes this evening and then resolving.  No chest pain or shortness of breath, no abdominal pain.  No vomiting, no diarrhea, no fever. ? ?Review of Systems  ?A thorough review of systems was obtained and all systems are negative except as noted in the HPI and PMH.  ? ?Patient's Health History   ? ?Past Medical History:  ?Diagnosis Date  ? Anxiety   ? Brain tumor (benign) ([place])   ? Chronic pain   ? Depression   ? Elevated cholesterol   ? Headache(784.0)   ? Hypertension   ? Numerous moles   ? Seizures ([place])   ? Trichimoniasis   ?  ?Past Surgical History:  ?Procedure Laterality Date  ? EXCISION OF SKIN TAG N/A 09/22/2017  ? Procedure: EXCISION OF SKIN TAGS ON FACE AND NECK (Procedure #2);  Surgeon: Jonnie Kind, MD;  Location: [facility];  Service: Gynecology;  Laterality: N/A;  ? HYSTEROSCOPY WITH D & C N/A 09/22/2017  ? Procedure: DILATATION AND CURETTAGE /HYSTEROSCOPY; EXCISION OF VULVAR AND RIGHT AND LEFT THIGH SKIN TAGS (Procedure #1);  Surgeon: Jonnie Kind, MD;  Location: [facility];  Service: Gynecology;  Laterality: N/A;  ? POLYPECTOMY N/A 09/22/2017  ? Procedure: REMOVAL OF ENDOMETRIAL POLYP (Procedure #1);  Surgeon: Jonnie Kind, MD;  Location: [facility];  Service: Gynecology;  Laterality: N/A;  ? TUBAL LIGATION    ?  ?Family History  ?Problem Relation Age of Onset  ? Stroke Mother   ? Heart failure Mother   ? Cancer Father   ?     lung  ? Diabetes Brother   ?  ?Social History  ? ?Socioeconomic History  ? Marital status: Widowed  ?  Spouse name: Not on file  ? Number of children: 3  ? Years of education: Not on  file  ? Highest education level: Not on file  ?Occupational History  ? Not on file  ?Tobacco Use  ? Smoking status: Never  ? Smokeless tobacco: Never  ?Vaping Use  ? Vaping Use: Never used  ?Substance and Sexual Activity  ? Alcohol use: No  ? Drug use: No  ? Sexual activity: Not Currently  ?  Birth control/protection: Post-menopausal, Surgical  ?  Comment: tubal  ?Other Topics Concern  ? Not on file  ?Social History Narrative  ? Not on file  ? ?Social Determinants of Health  ? ?Financial Resource Strain: Not on file  ?Food Insecurity: Not on file  ?Transportation Needs: Not on file  ?Physical Activity: Not on file  ?Stress: Not on file  ?Social Connections: Not on file  ?Intimate Partner Violence: Not on file  ?  ? ?Physical Exam  ? ?Vitals:  ? 06/30/21 2330 07/01/21 0000  ?BP: 139/78 114/66  ?Pulse: 82 71  ?Resp: 16 17  ?Temp:    ?SpO2: 100% 100%  ?  ?CONSTITUTIONAL: Chronically ill-appearing, NAD ?NEURO/PSYCH:  Alert and oriented x 3, no focal deficits ?EYES:  eyes equal and reactive ?ENT/NECK:  no LAD, no JVD ?CARDIO: Regular rate, well-perfused, normal S1 and S2 ?PULM:  CTAB no wheezing or  rhonchi ?GI/GU:  non-distended, non-tender ?MSK/SPINE:  No gross deformities, no edema ?SKIN:  no rash, atraumatic ? ? ?*Additional and/or pertinent findings included in MDM below ? ?Diagnostic and Interventional Summary  ? ? EKG Interpretation ? ?Date/Time:  Sunday June 30 2021 23:29:15 EDT ?Ventricular Rate:  81 ?PR Interval:  160 ?QRS Duration: 85 ?QT Interval:  379 ?QTC Calculation: 440 ?R Axis:   56 ?Text Interpretation: Sinus rhythm No significant change was found Confirmed by Gerlene Fee [phone]) on 06/30/2021 11:40:09 PM ?  ? ?  ? ?Labs Reviewed  ?COMPREHENSIVE METABOLIC PANEL - Abnormal; Notable for the following components:  ?    Result Value  ? Sodium 129 (*)   ? Potassium 3.4 (*)   ? Chloride 96 (*)   ? Glucose, Bld 100 (*)   ? Calcium 10.4 (*)   ? Total Protein 6.4 (*)   ? Total Bilirubin 1.4 (*)   ? All other  components within normal limits  ?URINALYSIS, ROUTINE W REFLEX MICROSCOPIC - Abnormal; Notable for the following components:  ? Leukocytes,Ua TRACE (*)   ? Bacteria, UA RARE (*)   ? All other components within normal limits  ?LIPASE, BLOOD  ?CBC  ?TROPONIN I (HIGH SENSITIVITY)  ?  ?No orders to display  ?  ?Medications  ?ondansetron (ZOFRAN-ODT) disintegrating tablet 4 mg (4 mg Oral Given 06/30/21 2311)  ?  ? ?Procedures  /  Critical Care ?Procedures ? ?ED Course and Medical Decision Making  ?Initial Impression and Ddx ?Patient presenting with more chronic nausea issues, has had multiple ED evaluations recently, reassuring abdominal imaging.  Has follow-up with GI next month.  The transient arm pain could be referred cardiac pain however felt to be less likely than some type of MSK process.  Obtaining EKG and troponin given patient's age and risk factors.  Anticipating discharge. ? ?Past medical/surgical history that increases complexity of ED encounter: Hypertension ? ?Interpretation of Diagnostics ?I personally reviewed the EKG and my interpretation is as follows: Sinus rhythm with no concerning ischemic features ?   ?Labs are reassuring without significant blood count or electrolyte disturbance. ? ?Patient Reassessment and Ultimate Disposition/Management ?Patient continues to look and feel well on reassessment, troponin is negative, patient is appropriate for discharge. ? ?Patient management required discussion with the following services or consulting groups:  None ? ?Complexity of Problems Addressed ?Acute illness or injury that poses threat of life of bodily function ? ?Additional Data Reviewed and Analyzed ?Further history obtained from: ?Prior ED visit notes and Prior labs/imaging results ? ?Additional Factors Impacting ED Encounter Risk ?Consideration of hospitalization ? ?Barth Kirks. Sedonia Small, MD ?[facility] ?[email or web address] ? ?Final Clinical Impressions(s) /  ED Diagnoses  ? ?  ICD-10-CM   ?1. Nausea  R11.0   ?  ?  ?ED Discharge Orders   ? ? None  ? ?  ?  ? ?Discharge Instructions Discussed with and Provided to Patient:  ? ? ? ?Discharge Instructions   ? ?  ?You were evaluated in the Emergency Department and after careful evaluation, we did not find any emergent condition requiring admission or further testing in the hospital. ? ?Your exam/testing today was overall reassuring.  Testing did not show any heart problems.  Recommend follow-up with your GI doctors, can use the Zofran at home for nausea. ? ?Please return to the Emergency Department if you experience any worsening of your condition.  Thank you for allowing [place] to be a part of  your care. ? ? ? ? ? ?  ?Maudie Flakes, MD ?07/01/21 0025 ? ?

## 2021-07-01 NOTE — Discharge Instructions (Signed)
You were evaluated in the Emergency Department and after careful evaluation, we did not find any emergent condition requiring admission or further testing in the hospital. ? ?Your exam/testing today was overall reassuring.  Testing did not show any heart problems.  Recommend follow-up with your GI doctors, can use the Zofran at home for nausea. ? ?Please return to the Emergency Department if you experience any worsening of your condition.  Thank you for allowing [place] to be a part of your care. ? ?

## 2021-07-09 ENCOUNTER — Emergency Department (HOSPITAL_COMMUNITY)
Admission: EM | Admit: 2021-07-09 | Discharge: 2021-07-09 | Disposition: A | Discharge status: Home / Self Care | Payer: Medicare HMO | Attending: Emergency Medicine | Admitting: Emergency Medicine

## 2021-07-09 ENCOUNTER — Other Ambulatory Visit: Payer: Self-pay

## 2021-07-09 ENCOUNTER — Encounter (HOSPITAL_COMMUNITY): Payer: Self-pay | Admitting: *Deleted

## 2021-07-09 ENCOUNTER — Emergency Department (HOSPITAL_COMMUNITY): Payer: Medicare HMO

## 2021-07-09 DIAGNOSIS — M79603 Pain in arm, unspecified: Secondary | ICD-10-CM | POA: Diagnosis not present

## 2021-07-09 DIAGNOSIS — R52 Pain, unspecified: Secondary | ICD-10-CM | POA: Diagnosis not present

## 2021-07-09 DIAGNOSIS — M25512 Pain in left shoulder: Secondary | ICD-10-CM | POA: Insufficient documentation

## 2021-07-09 DIAGNOSIS — M5412 Radiculopathy, cervical region: Secondary | ICD-10-CM | POA: Diagnosis not present

## 2021-07-09 DIAGNOSIS — Z79899 Other long term (current) drug therapy: Secondary | ICD-10-CM | POA: Insufficient documentation

## 2021-07-09 DIAGNOSIS — I10 Essential (primary) hypertension: Secondary | ICD-10-CM | POA: Insufficient documentation

## 2021-07-09 DIAGNOSIS — M542 Cervicalgia: Secondary | ICD-10-CM | POA: Diagnosis not present

## 2021-07-09 DIAGNOSIS — R112 Nausea with vomiting, unspecified: Secondary | ICD-10-CM | POA: Insufficient documentation

## 2021-07-09 LAB — COMPREHENSIVE METABOLIC PANEL
ALT: 22 U/L (ref 0–44)
AST: 18 U/L (ref 15–41)
Albumin: 4.2 g/dL (ref 3.5–5.0)
Alkaline Phosphatase: 99 U/L (ref 38–126)
Anion gap: 6 (ref 5–15)
BUN: 9 mg/dL (ref 8–23)
CO2: 25 mmol/L (ref 22–32)
Calcium: 10.8 mg/dL — ABNORMAL HIGH (ref 8.9–10.3)
Chloride: 102 mmol/L (ref 98–111)
Creatinine, Ser: 0.83 mg/dL (ref 0.44–1.00)
GFR, Estimated: >60 mL/min (ref >60)
Glucose, Bld: 108 mg/dL — ABNORMAL HIGH (ref 70–99)
Potassium: 3.7 mmol/L (ref 3.5–5.1)
Sodium: 133 mmol/L — ABNORMAL LOW (ref 135–145)
Total Bilirubin: 1.3 mg/dL — ABNORMAL HIGH (ref 0.3–1.2)
Total Protein: 7.1 g/dL (ref 6.5–8.1)

## 2021-07-09 LAB — CBC WITH DIFFERENTIAL/PLATELET
Abs Immature Granulocytes: 0.02 10*3/uL (ref 0.00–0.07)
Basophils Absolute: 0 10*3/uL (ref 0.0–0.1)
Basophils Relative: 1 %
Eosinophils Absolute: 0 10*3/uL (ref 0.0–0.5)
Eosinophils Relative: 0 %
HCT: 40.8 % (ref 36.0–46.0)
Hemoglobin: 13.9 g/dL (ref 12.0–15.0)
Immature Granulocytes: 0 %
Lymphocytes Relative: 22 %
Lymphs Abs: 1.3 10*3/uL (ref 0.7–4.0)
MCH: 30.1 pg (ref 26.0–34.0)
MCHC: 34.1 g/dL (ref 30.0–36.0)
MCV: 88.3 fL (ref 80.0–100.0)
Monocytes Absolute: 0.5 10*3/uL (ref 0.1–1.0)
Monocytes Relative: 8 %
Neutro Abs: 4 10*3/uL (ref 1.7–7.7)
Neutrophils Relative %: 69 %
Platelets: 246 10*3/uL (ref 150–400)
RBC: 4.62 MIL/uL (ref 3.87–5.11)
RDW: 12.5 % (ref 11.5–15.5)
WBC: 5.9 10*3/uL (ref 4.0–10.5)
nRBC: 0 % (ref 0.0–0.2)

## 2021-07-09 LAB — TROPONIN I (HIGH SENSITIVITY): Troponin I (High Sensitivity): 2 ng/L (ref <18)

## 2021-07-09 MED ORDER — PREDNISONE 10 MG PO TABS: ORAL_TABLET | ORAL | 0 refills | Status: DC

## 2021-07-09 MED ORDER — PROMETHAZINE HCL 12.5 MG PO TABS
25.0000 mg | ORAL_TABLET | Freq: Once | ORAL | Status: AC
  Administered 2021-07-09: 25 mg via ORAL
  Filled 2021-07-09: qty 2

## 2021-07-09 NOTE — Discharge Instructions (Addendum)
Take the entire course of the prednisone prescribed, this is a tapering dose meaning you are going to start out taking a high dose and every day reduce the number of tablets.  Call your primary doctor for further evaluation of your symptoms.  You may eventually need a referral to a spine specialist as I suspect your symptoms are secondary to a pinched nerve originating in your neck. ? ?I do advise you to get rechecked immediately if you develop weakness in the arm or hand. ?

## 2021-07-09 NOTE — ED Triage Notes (Signed)
Pt brought in by [facility] for c/o left arm burning pain; pt states she feels nauseous ?

## 2021-07-09 NOTE — ED Provider Notes (Signed)
?[facility] ?Provider Note ? ? ?CSN: [id number] ?Arrival date & time: 07/09/21  0841 ? ?  ? ?History ? ?Chief Complaint  ?Patient presents with  ? Arm Pain  ? ? ?Julia [name] is a 61 y.o. female with a history including anxiety, seizure disorder, hypertension, migraines, chronic neck pain having been told by her PCP that she has a "pinched nerve", also recent history of hypercalcemia presenting for evaluation of burning pain that originates in her left shoulder and radiates to her left dorsal hand.  This symptom she describes is intermittent but has been more severe than normal and constant since yesterday afternoon.  She denies any injuries.  She denies any new rash, redness or skin lesions.  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  She denies weakness in her upper extremity.  She also denies chest pain, shortness of breath, diaphoresis.  She she does have nausea on a frequent basis and she reports it was more severe this morning with emesis x1 prior to arrival.  She has had no treatment for symptoms prior to arrival. ? ?The history is provided by the patient.  ? ?  ? ?Home Medications ?Prior to Admission medications   ?Medication Sig Start Date End Date Taking? Authorizing Provider  ?albuterol (PROVENTIL HFA;VENTOLIN HFA) 108 (90 Base) MCG/ACT inhaler Inhale 1-2 puffs into the lungs every 6 (six) hours as needed for shortness of breath. 09/18/17  Yes [provider]  ?ALPRAZolam Duanne Moron) 1 MG tablet Take 1 mg by mouth 4 (four) times daily.   Yes [provider]  ?Cholecalciferol (VITAMIN D3) 50 MCG (2000 UT) CAPS Take 1 capsule by mouth 2 (two) times daily.   Yes [provider]  ?ezetimibe (ZETIA) 10 MG tablet Take 10 mg by mouth daily.   Yes [provider]  ?furosemide (LASIX) 20 MG tablet Take 20 mg by mouth daily.   Yes [provider]  ?predniSONE (DELTASONE) 10 MG tablet 6, 5, 4, 3, 2 then 1 tablet by mouth daily for 6  days total. 07/09/21  Yes Lutricia Widjaja, Almyra Free, PA-C  ?alendronate (FOSAMAX) 70 MG tablet Take 1 tablet (70 mg total) by mouth every 7 (seven) days. Take with a full glass of water on an empty stomach. 06/20/21   Cassandria Anger, MD  ?dicyclomine (BENTYL) 20 MG tablet Take 1 tablet (20 mg total) by mouth 2 (two) times daily. 07/10/21   Sharyn Creamer, MD  ?gabapentin (NEURONTIN) 300 MG capsule Take by mouth. 05/31/21   [provider]  ?hydrochlorothiazide (MICROZIDE) 12.5 MG capsule Take 12.5 mg by mouth daily. 05/15/21   [provider]  ?losartan (COZAAR) 50 MG tablet Take 50 mg by mouth daily.  09/01/13   [provider]  ?Magnesium 200 MG TABS Take 1 tablet by mouth 2 (two) times daily.    [provider]  ?Menaquinone-7 (VITAMIN K2 PO) Take 1 tablet by mouth daily.    [provider]  ?ondansetron (ZOFRAN-ODT) 4 MG disintegrating tablet Take 4 mg by mouth every 6 (six) hours as needed. 06/21/21   [provider]  ?oxyCODONE-acetaminophen (PERCOCET/ROXICET) 5-325 MG tablet Take by mouth 2 (two) times daily. 04/27/21   [provider]  ?pantoprazole (PROTONIX) 40 MG tablet Take 40 mg by mouth 2 (two) times daily. 05/02/21   [provider]  ?potassium chloride SA (KLOR-CON) 20 MEQ tablet Take 20 mEq by mouth daily. 08/02/20   [provider]  ?pravastatin (PRAVACHOL) 40 MG  tablet Take 40 mg by mouth daily. 06/03/21   [provider]  ?promethazine (PHENERGAN) 25 MG suppository Place 1 suppository (25 mg total) rectally every 6 (six) hours as needed for nausea or vomiting. 12/11/20   Milton Ferguson, MD  ?propranolol (INDERAL) 20 MG tablet Take 20 mg by mouth daily. 05/31/21   [provider]  ?topiramate (TOPAMAX) 50 MG tablet Take 50 mg by mouth daily. 05/31/21   [provider]  ?   ? ?Allergies    ?Propranolol, Naprosyn [naproxen], and Penicillins   ? ?Review of Systems   ?Review of Systems  ?Constitutional:  Negative for  fever.  ?HENT:  Negative for congestion.   ?Eyes: Negative.   ?Respiratory:  Negative for chest tightness and shortness of breath.   ?Cardiovascular:  Negative for chest pain.  ?Gastrointestinal:  Positive for nausea and vomiting. Negative for abdominal pain.  ?Genitourinary: Negative.   ?Musculoskeletal:  Positive for arthralgias and neck pain. Negative for joint swelling and neck stiffness.  ?Skin: Negative.  Negative for color change, rash and wound.  ?Neurological:  Negative for dizziness, weakness, light-headedness, numbness and headaches.  ?Psychiatric/Behavioral: Negative.    ?All other systems reviewed and are negative. ? ?Physical Exam ?Updated Vital Signs ?BP (!) 138/97 (BP Location: Right Arm)   Pulse 99   Temp 98.7 ?F (37.1 ?C)   Resp 19   Ht '5\' 1"'$  (1.549 m)   Wt 59 kg   LMP 09/06/2012   SpO2 100%   BMI 24.56 kg/m?  ?Physical Exam ?Vitals and nursing note reviewed.  ?Constitutional:   ?   Appearance: She is well-developed.  ?HENT:  ?   Head: Normocephalic and atraumatic.  ?Eyes:  ?   Conjunctiva/sclera: Conjunctivae normal.  ?Cardiovascular:  ?   Rate and Rhythm: Normal rate and regular rhythm.  ?   Pulses: Normal pulses.  ?   Heart sounds: Normal heart sounds.  ?Pulmonary:  ?   Effort: Pulmonary effort is normal.  ?   Breath sounds: Normal breath sounds. No wheezing.  ?Abdominal:  ?   General: Bowel sounds are normal.  ?   Palpations: Abdomen is soft.  ?   Tenderness: There is no abdominal tenderness. There is no guarding.  ?Musculoskeletal:     ?   General: No swelling, tenderness or deformity. Normal range of motion.  ?   Cervical back: Normal range of motion. No rigidity.  ?   Comments: Equal grip strength.  Negative Tinels.    ?Skin: ?   General: Skin is warm and dry.  ?Neurological:  ?   General: No focal deficit present.  ?   Mental Status: She is alert and oriented to person, place, and time.  ?   Deep Tendon Reflexes:  ?   Reflex Scores: ?     Bicep reflexes are 2+ on the right side and  2+ on the left side. ? ? ?ED Results / Procedures / Treatments   ?Labs ?(all labs ordered are listed, but only abnormal results are displayed) ?Labs Reviewed  ?COMPREHENSIVE METABOLIC PANEL - Abnormal; Notable for the following components:  ?    Result Value  ? Sodium 133 (*)   ? Glucose, Bld 108 (*)   ? Calcium 10.8 (*)   ? Total Bilirubin 1.3 (*)   ? All other components within normal limits  ?CBC WITH DIFFERENTIAL/PLATELET  ?TROPONIN I (HIGH SENSITIVITY)  ? ? ?EKG ?EKG Interpretation ? ?Date/Time:  Tuesday Jul 09 2021 10:05:11 EDT ?Ventricular Rate:  83 ?PR Interval:  135 ?QRS Duration: 93 ?QT Interval:  363 ?QTC Calculation: 427 ?R Axis:   15 ?Text Interpretation: Sinus rhythm Minimal ST elevation, inferior leads Confirmed by Thamas Jaegers ([id number]) on 07/10/2021 10:21:33 PM ? ?Radiology ?No results found. ?Results for orders placed or performed during the hospital encounter of 07/09/21  ?CBC with Differential  ?Result Value Ref Range  ? WBC 5.9 4.0 - 10.5 K/uL  ? RBC 4.62 3.87 - 5.11 MIL/uL  ? Hemoglobin 13.9 12.0 - 15.0 g/dL  ? HCT 40.8 36.0 - 46.0 %  ? MCV 88.3 80.0 - 100.0 fL  ? MCH 30.1 26.0 - 34.0 pg  ? MCHC 34.1 30.0 - 36.0 g/dL  ? RDW 12.5 11.5 - 15.5 %  ? Platelets 246 150 - 400 K/uL  ? nRBC 0.0 0.0 - 0.2 %  ? Neutrophils Relative % 69 %  ? Neutro Abs 4.0 1.7 - 7.7 K/uL  ? Lymphocytes Relative 22 %  ? Lymphs Abs 1.3 0.7 - 4.0 K/uL  ? Monocytes Relative 8 %  ? Monocytes Absolute 0.5 0.1 - 1.0 K/uL  ? Eosinophils Relative 0 %  ? Eosinophils Absolute 0.0 0.0 - 0.5 K/uL  ? Basophils Relative 1 %  ? Basophils Absolute 0.0 0.0 - 0.1 K/uL  ? Immature Granulocytes 0 %  ? Abs Immature Granulocytes 0.02 0.00 - 0.07 K/uL  ?Comprehensive metabolic panel  ?Result Value Ref Range  ? Sodium 133 (L) 135 - 145 mmol/L  ? Potassium 3.7 3.5 - 5.1 mmol/L  ? Chloride 102 98 - 111 mmol/L  ? CO2 25 22 - 32 mmol/L  ? Glucose, Bld 108 (H) 70 - 99 mg/dL  ? BUN 9 8 - 23 mg/dL  ? Creatinine, Ser 0.83 0.44 - 1.00 mg/dL  ? Calcium 10.8 (H)  8.9 - 10.3 mg/dL  ? Total Protein 7.1 6.5 - 8.1 g/dL  ? Albumin 4.2 3.5 - 5.0 g/dL  ? AST 18 15 - 41 U/L  ? ALT 22 0 - 44 U/L  ? Alkaline Phosphatase 99 38 - 126 U/L  ? Total Bilirubin 1.3 (H) 0.3 - 1.2 mg/dL

## 2021-07-10 ENCOUNTER — Encounter: Payer: Self-pay | Admitting: Internal Medicine

## 2021-07-10 ENCOUNTER — Ambulatory Visit (INDEPENDENT_AMBULATORY_CARE_PROVIDER_SITE_OTHER): Payer: Medicare HMO | Admitting: Internal Medicine

## 2021-07-10 VITALS — BP 124/80 | HR 103 | Ht 61.0 in | Wt 128.0 lb

## 2021-07-10 DIAGNOSIS — R1030 Lower abdominal pain, unspecified: Secondary | ICD-10-CM | POA: Diagnosis not present

## 2021-07-10 DIAGNOSIS — R1110 Vomiting, unspecified: Secondary | ICD-10-CM | POA: Diagnosis not present

## 2021-07-10 DIAGNOSIS — Z8719 Personal history of other diseases of the digestive system: Secondary | ICD-10-CM

## 2021-07-10 MED ORDER — DICYCLOMINE HCL 20 MG PO TABS
20.0000 mg | ORAL_TABLET | Freq: Two times a day (BID) | ORAL | 2 refills | Status: DC
Start: 2021-07-10 — End: 2023-11-12

## 2021-07-10 NOTE — Patient Instructions (Addendum)
We have sent the following medications to your pharmacy for you to pick up at your convenience: dicyclomine.  ? ?Drink 8 cups of water a day and walk 30 minutes a day. ? ?Please purchase the following medications over the counter and take as directed: ?Fiber supplement such as Benefiber- use as directed daily ?Miralax: Take as  directed up to 3 times a day to achieve regular bowel movements ? ?You have been scheduled for a gastric emptying scan at [facility] on 07/17/21 at 9:00 am. Please arrive at least 15 minutes prior to your appointment for registration. Please make certain not to have anything to eat or drink after midnight the night before your test. Hold all stomach medications (ex: Zofran, phenergan, Reglan) 48 hours prior to your test. If you need to reschedule your appointment, please contact radiology scheduling at [phone]. ?___________________________________________________________ ?A gastric-emptying study measures how long it takes for food to move through your stomach. There are several ways to measure stomach emptying. In the most common test, you eat food that contains a small amount of radioactive material. A scanner that detects the movement of the radioactive material is placed over your abdomen to monitor the rate at which food leaves your stomach. This test normally takes about 4 hours to complete. ?___________________________________________________________ ? ?The [facility] providers would like to encourage you to use [facility] to communicate with providers for non-urgent requests or questions.  Due to long hold times on the telephone, sending your provider a message by [facility] may be a faster and more efficient way to get a response.  Please allow 48 business hours for a response.  Please remember that this is for non-urgent requests.  ? ?Due to recent changes in healthcare laws, you may see the results of your imaging and laboratory studies on MyChart before your provider has  had a chance to review them.  We understand that in some cases there may be results that are confusing or concerning to you. Not all laboratory results come back in the same time frame and the provider may be waiting for multiple results in order to interpret others.  Please give [place] 48 hours in order for your provider to thoroughly review all the results before contacting the office for clarification of your results.  ? ?Thank you for choosing me and [facility]. ? ? ? ?

## 2021-07-10 NOTE — Progress Notes (Signed)
? ?Chief Complaint: Abdominal discomfort, N&V ? ?HPI : 61 year old female with history of HTN, seizures, anxiety presents with abdominal discomfort and N&V. ? ?Interval History: She has felt a pain in her stomach that feels like a burning sensation. She has felt really bloated. This pain is located in the lower abdomen. Every morning she will wake up and complain that her stomach hurts. She is still taking pantoprazole 40 mg BID, but she is not sure if this medication is helping. After a visit to the ED, she was started on dicyclomine PRN that has been helping. She is going 1-2 times per day to have a BM. Patient is currently following with an endocrinologist for management of her hypercalcemia.  ? ?Wt Readings from Last 3 Encounters:  ?07/10/21 128 lb (58.1 kg)  ?07/09/21 130 lb (59 kg)  ?06/30/21 130 lb (59 kg)  ? ?Current Outpatient Medications  ?Medication Sig Dispense Refill  ? albuterol (PROVENTIL HFA;VENTOLIN HFA) 108 (90 Base) MCG/ACT inhaler Inhale 1-2 puffs into the lungs every 6 (six) hours as needed for shortness of breath.    ? alendronate (FOSAMAX) 70 MG tablet Take 1 tablet (70 mg total) by mouth every 7 (seven) days. Take with a full glass of water on an empty stomach. 4 tablet 11  ? ALPRAZolam (XANAX) 1 MG tablet Take 1 mg by mouth 4 (four) times daily.    ? Cholecalciferol (VITAMIN D3) 50 MCG (2000 UT) CAPS Take 1 capsule by mouth 2 (two) times daily.    ? ezetimibe (ZETIA) 10 MG tablet Take 10 mg by mouth daily.    ? furosemide (LASIX) 20 MG tablet Take 20 mg by mouth daily.    ? gabapentin (NEURONTIN) 300 MG capsule Take by mouth.    ? hydrochlorothiazide (MICROZIDE) 12.5 MG capsule Take 12.5 mg by mouth daily.    ? losartan (COZAAR) 50 MG tablet Take 50 mg by mouth daily.     ? Magnesium 200 MG TABS Take 1 tablet by mouth 2 (two) times daily.    ? Menaquinone-7 (VITAMIN K2 PO) Take 1 tablet by mouth daily.    ? ondansetron (ZOFRAN-ODT) 4 MG disintegrating tablet Take 4 mg by mouth every 6 (six)  hours as needed.    ? oxyCODONE-acetaminophen (PERCOCET/ROXICET) 5-325 MG tablet Take by mouth 2 (two) times daily.    ? pantoprazole (PROTONIX) 40 MG tablet Take 40 mg by mouth 2 (two) times daily.    ? potassium chloride SA (KLOR-CON) 20 MEQ tablet Take 20 mEq by mouth daily.    ? pravastatin (PRAVACHOL) 40 MG tablet Take 40 mg by mouth daily.    ? predniSONE (DELTASONE) 10 MG tablet 6, 5, 4, 3, 2 then 1 tablet by mouth daily for 6 days total. 21 tablet 0  ? promethazine (PHENERGAN) 25 MG suppository Place 1 suppository (25 mg total) rectally every 6 (six) hours as needed for nausea or vomiting. 12 each 1  ? propranolol (INDERAL) 20 MG tablet Take 20 mg by mouth daily.    ? topiramate (TOPAMAX) 50 MG tablet Take 50 mg by mouth daily.    ? dicyclomine (BENTYL) 20 MG tablet Take 1 tablet (20 mg total) by mouth 2 (two) times daily. 60 tablet 2  ? ?No current facility-administered medications for this visit.  ? ? ?Review of Systems: ?All systems reviewed and negative except where noted in HPI.  ? ?Physical Exam: ?BP 124/80   Pulse (!) 103   Ht '5\' 1"'$  (1.549 m)  Wt 128 lb (58.1 kg)   LMP 09/06/2012   BMI 24.19 kg/m?  ?Constitutional: Pleasant,well-developed, female in no acute distress. ?HEENT: Normocephalic and atraumatic. Conjunctivae are normal. No scleral icterus. ?Cardiovascular: Normal rate ?Pulmonary/chest: No increased WOB ?Abdominal: Soft, nondistended, nontender. Bowel sounds active throughout. There are no masses palpable. No hepatomegaly. ?Extremities: No edema ?Neurological: Alert and oriented to person place and time. ?Skin: Skin is warm and dry. No rashes noted. ?Psychiatric: Normal mood and affect. Behavior is normal. ? ?Labs 02/2021: CBC unremarkable. CMP with elevated Ca 11.1. Lipase nml. ? ?Labs 06/2021: CBC nml. CMP with slightly low Na of 133. ? ?CT A/P w/contrast 04/27/21: ?IMPRESSION:  ?1. No acute findings in the abdomen or pelvis.  ?2. Left colonic diverticulosis without diverticulitis.  ?3.  Stable 1.7 cm left adrenal nodule documented on multiple prior exams back to 2007 and compatible with adenoma.  ?4. Aortic Atherosclerosis (ICD10-I70.0). ? ?Abd U/S 06/25/21: ?IMPRESSION:  ?Increased echogenicity of hepatic parenchyma is noted suggesting hepatic steatosis. Probable right hepatic cyst is noted. ? ?Colonoscopy 03/19/21: ?- The examined portion of the ileum was normal. ?- Diverticulosis in the sigmoid colon, in the descending colon, in the transverse colon, in the ascending colon and in the cecum. ?- Four 3 to 10 mm polyps in the transverse colon, removed with a cold snare. Resected and retrieved. ?- Non-bleeding internal hemorrhoids. ?Path: ?Surgical [P], colon, transverse, polyp (4) ?TUBULAR ADENOMAS ?NEGATIVE FOR HIGH-GRADE DYSPLASIA AND CARCINOMA ? ?EGD 03/19/21: ?- Normal esophagus. Biopsied. ?- Gastritis. Biopsied. ?- A small amount of food (residue) in the stomach. ?- Normal examined duodenum. Biopsied. ?Path: ?1. Surgical [P], duodenum ?BENIGN DUODENAL MUCOSA WITH NO DIAGNOSTIC ABNORMALITY ?2. Surgical [P], gastric ?CHRONIC GASTRITIS WITH LYMPHOID AGGREGATE ?HELICOBACTER STAIN NEGATIVE (IHC, ADEQUATE CONTROL) ?NEGATIVE FOR INTESTINAL METAPLASIA, DYSPLASIA AND CARCINOMA ?3. Surgical [P], esophagus ?REACTIVE SQUAMOUS MUCOSA ? 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 PPI BID is not adequate to help control her symptoms. She is benefiting from an antispasmodic medication, which could suggest that her abdominal pain may be more related to constipation. She did have improvement in her pain after completing a laxative preparation for the colonoscopy procedure. Thus she may have underlying stool burden that has re-accumulated and is currently leading to her abdominal pain. I will start her on some conservative therapies for constipation. In  the past, due to the food that was seen in her stomach on EGD, I have also been concerned about possible gastroparesis. Thus will obtain a gastric emptying study to formally evaluate for delayed gastric emptying.  ?- Start 8 cups of water, 30 minutes of walking per day ?- Daily fiber supplement ?- Daily Miralax ?- Refilled Dicyclomine 20 mg BID PRN ?- Gastric emptying study ?- Continue Phenergan suppositories ?- Recall for colonoscopy for polyp surveillance in 03/2024 ?- RTC 6 weeks ? ?[name], MD ? ?

## 2021-07-17 ENCOUNTER — Encounter (HOSPITAL_COMMUNITY)
Admission: RE | Admit: 2021-07-17 | Discharge: 2021-07-17 | Disposition: A | Discharge status: Home / Self Care | Payer: Medicare HMO | Source: Ambulatory Visit | Attending: Internal Medicine | Admitting: Internal Medicine

## 2021-07-17 DIAGNOSIS — R1110 Vomiting, unspecified: Secondary | ICD-10-CM | POA: Diagnosis not present

## 2021-07-17 DIAGNOSIS — Z8719 Personal history of other diseases of the digestive system: Secondary | ICD-10-CM | POA: Diagnosis not present

## 2021-07-17 DIAGNOSIS — R1030 Lower abdominal pain, unspecified: Secondary | ICD-10-CM | POA: Diagnosis not present

## 2021-07-17 DIAGNOSIS — R110 Nausea: Secondary | ICD-10-CM | POA: Diagnosis not present

## 2021-07-17 DIAGNOSIS — R140 Abdominal distension (gaseous): Secondary | ICD-10-CM | POA: Diagnosis not present

## 2021-07-17 MED ORDER — TECHNETIUM TC 99M SULFUR COLLOID
2.2000 | Freq: Once | INTRAVENOUS | Status: AC
  Administered 2021-07-17: 2.2 via ORAL

## 2021-07-19 ENCOUNTER — Other Ambulatory Visit: Payer: Self-pay

## 2021-07-19 ENCOUNTER — Emergency Department (HOSPITAL_COMMUNITY)
Admission: EM | Admit: 2021-07-19 | Discharge: 2021-07-19 | Disposition: A | Discharge status: Home / Self Care | Payer: Medicare HMO | Attending: Emergency Medicine | Admitting: Emergency Medicine

## 2021-07-19 ENCOUNTER — Emergency Department (HOSPITAL_COMMUNITY): Payer: Medicare HMO

## 2021-07-19 ENCOUNTER — Encounter (HOSPITAL_COMMUNITY): Payer: Self-pay | Admitting: *Deleted

## 2021-07-19 DIAGNOSIS — R1031 Right lower quadrant pain: Secondary | ICD-10-CM | POA: Diagnosis not present

## 2021-07-19 DIAGNOSIS — E876 Hypokalemia: Secondary | ICD-10-CM | POA: Diagnosis not present

## 2021-07-19 DIAGNOSIS — R109 Unspecified abdominal pain: Secondary | ICD-10-CM | POA: Diagnosis not present

## 2021-07-19 DIAGNOSIS — Z79899 Other long term (current) drug therapy: Secondary | ICD-10-CM | POA: Diagnosis not present

## 2021-07-19 DIAGNOSIS — I10 Essential (primary) hypertension: Secondary | ICD-10-CM | POA: Insufficient documentation

## 2021-07-19 DIAGNOSIS — N3000 Acute cystitis without hematuria: Secondary | ICD-10-CM | POA: Diagnosis not present

## 2021-07-19 DIAGNOSIS — G894 Chronic pain syndrome: Secondary | ICD-10-CM | POA: Diagnosis not present

## 2021-07-19 DIAGNOSIS — I700 Atherosclerosis of aorta: Secondary | ICD-10-CM | POA: Diagnosis not present

## 2021-07-19 LAB — URINALYSIS, ROUTINE W REFLEX MICROSCOPIC
Bilirubin Urine: NEGATIVE
Glucose, UA: NEGATIVE mg/dL
Hgb urine dipstick: NEGATIVE
Ketones, ur: NEGATIVE mg/dL
Nitrite: POSITIVE — AB
Protein, ur: NEGATIVE mg/dL
Specific Gravity, Urine: 1.012 (ref 1.005–1.030)
pH: 7 (ref 5.0–8.0)

## 2021-07-19 LAB — COMPREHENSIVE METABOLIC PANEL
ALT: 31 U/L (ref 0–44)
AST: 22 U/L (ref 15–41)
Albumin: 4 g/dL (ref 3.5–5.0)
Alkaline Phosphatase: 92 U/L (ref 38–126)
Anion gap: 6 (ref 5–15)
BUN: 10 mg/dL (ref 8–23)
CO2: 25 mmol/L (ref 22–32)
Calcium: 10.1 mg/dL (ref 8.9–10.3)
Chloride: 103 mmol/L (ref 98–111)
Creatinine, Ser: 0.81 mg/dL (ref 0.44–1.00)
GFR, Estimated: >60 mL/min (ref >60)
Glucose, Bld: 104 mg/dL — ABNORMAL HIGH (ref 70–99)
Potassium: 3.4 mmol/L — ABNORMAL LOW (ref 3.5–5.1)
Sodium: 134 mmol/L — ABNORMAL LOW (ref 135–145)
Total Bilirubin: 2 mg/dL — ABNORMAL HIGH (ref 0.3–1.2)
Total Protein: 6.8 g/dL (ref 6.5–8.1)

## 2021-07-19 LAB — LIPASE, BLOOD: Lipase: 28 U/L (ref 11–51)

## 2021-07-19 LAB — CBC
HCT: 39.2 % (ref 36.0–46.0)
Hemoglobin: 13.5 g/dL (ref 12.0–15.0)
MCH: 30.6 pg (ref 26.0–34.0)
MCHC: 34.4 g/dL (ref 30.0–36.0)
MCV: 88.9 fL (ref 80.0–100.0)
Platelets: 237 10*3/uL (ref 150–400)
RBC: 4.41 MIL/uL (ref 3.87–5.11)
RDW: 12.7 % (ref 11.5–15.5)
WBC: 7.3 10*3/uL (ref 4.0–10.5)
nRBC: 0 % (ref 0.0–0.2)

## 2021-07-19 MED ORDER — PROMETHAZINE HCL 25 MG PO TABS: 25.0000 mg | ORAL_TABLET | Freq: Four times a day (QID) | ORAL | 0 refills | Status: DC | PRN

## 2021-07-19 MED ORDER — IOHEXOL 300 MG/ML  SOLN
100.0000 mL | Freq: Once | INTRAMUSCULAR | Status: AC | PRN
  Administered 2021-07-19: 100 mL via INTRAVENOUS

## 2021-07-19 MED ORDER — CEPHALEXIN 500 MG PO CAPS: 500.0000 mg | ORAL_CAPSULE | Freq: Four times a day (QID) | ORAL | 0 refills | Status: DC

## 2021-07-19 MED ORDER — SODIUM CHLORIDE 0.9% FLUSH
3.0000 mL | Freq: Once | INTRAVENOUS | Status: AC
  Administered 2021-07-19: 3 mL via INTRAVENOUS

## 2021-07-19 MED ORDER — SODIUM CHLORIDE 0.9 % IV SOLN
12.5000 mg | Freq: Once | INTRAVENOUS | Status: AC
  Administered 2021-07-19: 12.5 mg via INTRAVENOUS
  Filled 2021-07-19: qty 0.5

## 2021-07-19 MED ORDER — SODIUM CHLORIDE 0.9 % IV BOLUS
1000.0000 mL | Freq: Once | INTRAVENOUS | Status: AC
Start: 2021-07-19 — End: 2021-07-19
  Administered 2021-07-19: 1000 mL via INTRAVENOUS

## 2021-07-19 MED ORDER — HYDROMORPHONE HCL 1 MG/ML IJ SOLN
1.0000 mg | Freq: Once | INTRAMUSCULAR | Status: AC
  Administered 2021-07-19: 1 mg via INTRAVENOUS
  Filled 2021-07-19: qty 1

## 2021-07-19 NOTE — ED Provider Notes (Addendum)
?[facility] ?Provider Note ? ? ?CSN: [id number] ?Arrival date & time: 07/19/21  4332 ? ?  ? ?History ? ?Chief Complaint  ?Patient presents with  ? Abdominal Pain  ? ? ?Julia [name] is a 61 y.o. female. ? ?Patient with acute onset of right lower quadrant abdominal pain and lower abdominal pain on Thursday.  Associated nausea and vomiting.  In triage patient talked about right flank pain.  But for me she is pointing to her right lower quadrant predominantly.  No fever on presentation heart rate 91 respirations 20 blood pressure 118/68 oxygen sats 100%.  Chart review shows that patient is currently being followed by [facility] gastroenterology.  Had a gastric emptying study just done at [facility] on May 10.  Results still pending.  GI does not have an exact explanation so far for patient's abdominal pain and persistent vomiting.  They are trying to sort that out.  She last saw gastroenterology May 3.  Patient is on chronic pain medicines has a month supply of oxycodone 5-3 25 last received April 25. ? ?Past medical history significant for chronic pain hypertension anxiety patient is on Protonix.  Patient's had polyps removed by gastroenterology.  They have been benign.  Past surgical history is just significant for the polypectomies and tubal ligation.  Patient has not had her appendix removed. ? ?Patient's last CT scan of the abdomen was February of this year. ? ? ?  ? ?Home Medications ?Prior to Admission medications   ?Medication Sig Start Date End Date Taking? Authorizing Provider  ?albuterol (PROVENTIL HFA;VENTOLIN HFA) 108 (90 Base) MCG/ACT inhaler Inhale 1-2 puffs into the lungs every 6 (six) hours as needed for shortness of breath. 09/18/17   [provider]  ?alendronate (FOSAMAX) 70 MG tablet Take 1 tablet (70 mg total) by mouth every 7 (seven) days. Take with a full glass of water on an empty stomach. 06/20/21   Cassandria Anger, MD  ?ALPRAZolam Duanne Moron) 1 MG tablet Take 1  mg by mouth 4 (four) times daily.    [provider]  ?Cholecalciferol (VITAMIN D3) 50 MCG (2000 UT) CAPS Take 1 capsule by mouth 2 (two) times daily.    [provider]  ?dicyclomine (BENTYL) 20 MG tablet Take 1 tablet (20 mg total) by mouth 2 (two) times daily. 07/10/21   Sharyn Creamer, MD  ?ezetimibe (ZETIA) 10 MG tablet Take 10 mg by mouth daily.    [provider]  ?furosemide (LASIX) 20 MG tablet Take 20 mg by mouth daily.    [provider]  ?gabapentin (NEURONTIN) 300 MG capsule Take by mouth. 05/31/21   [provider]  ?hydrochlorothiazide (MICROZIDE) 12.5 MG capsule Take 12.5 mg by mouth daily. 05/15/21   [provider]  ?losartan (COZAAR) 50 MG tablet Take 50 mg by mouth daily.  09/01/13   [provider]  ?Magnesium 200 MG TABS Take 1 tablet by mouth 2 (two) times daily.    [provider]  ?Menaquinone-7 (VITAMIN K2 PO) Take 1 tablet by mouth daily.    [provider]  ?ondansetron (ZOFRAN-ODT) 4 MG disintegrating tablet Take 4 mg by mouth every 6 (six) hours as needed. 06/21/21   [provider]  ?oxyCODONE-acetaminophen (PERCOCET/ROXICET) 5-325 MG tablet Take by mouth 2 (two) times daily. 04/27/21   [provider]  ?pantoprazole (PROTONIX) 40 MG tablet Take 40 mg by mouth 2 (two) times daily. 05/02/21   [provider]  ?potassium chloride SA (  KLOR-CON) 20 MEQ tablet Take 20 mEq by mouth daily. 08/02/20   [provider]  ?pravastatin (PRAVACHOL) 40 MG tablet Take 40 mg by mouth daily. 06/03/21   [provider]  ?predniSONE (DELTASONE) 10 MG tablet 6, 5, 4, 3, 2 then 1 tablet by mouth daily for 6 days total. 07/09/21   Idol, Almyra Free, PA-C  ?promethazine (PHENERGAN) 25 MG suppository Place 1 suppository (25 mg total) rectally every 6 (six) hours as needed for nausea or vomiting. 12/11/20   Milton Ferguson, MD  ?propranolol (INDERAL) 20 MG tablet Take 20 mg by mouth daily. 05/31/21    [provider]  ?topiramate (TOPAMAX) 50 MG tablet Take 50 mg by mouth daily. 05/31/21   [provider]  ?   ? ?Allergies    ?Propranolol, Naprosyn [naproxen], and Penicillins   ? ?Review of Systems   ?Review of Systems  ?Constitutional:  Negative for chills and fever.  ?HENT:  Negative for ear pain and sore throat.   ?Eyes:  Negative for pain and visual disturbance.  ?Respiratory:  Negative for cough and shortness of breath.   ?Cardiovascular:  Negative for chest pain and palpitations.  ?Gastrointestinal:  Positive for abdominal pain, nausea and vomiting.  ?Genitourinary:  Positive for flank pain. Negative for dysuria and hematuria.  ?Musculoskeletal:  Negative for arthralgias and back pain.  ?Skin:  Negative for color change and rash.  ?Neurological:  Negative for seizures and syncope.  ?All other systems reviewed and are negative. ? ?Physical Exam ?Updated Vital Signs ?BP 126/85   Pulse 87   Temp 97.8 ?F (36.6 ?C) (Oral)   Resp 16   Ht 1.549 m ('5\' 1"'$ )   Wt 58.1 kg   LMP 09/06/2012   SpO2 100%   BMI 24.19 kg/m?  ?Physical Exam ?Vitals and nursing note reviewed.  ?Constitutional:   ?   General: She is not in acute distress. ?   Appearance: She is well-developed. She is ill-appearing.  ?HENT:  ?   Head: Normocephalic and atraumatic.  ?Eyes:  ?   Conjunctiva/sclera: Conjunctivae normal.  ?Cardiovascular:  ?   Rate and Rhythm: Normal rate and regular rhythm.  ?   Heart sounds: No murmur heard. ?Pulmonary:  ?   Effort: Pulmonary effort is normal. No respiratory distress.  ?   Breath sounds: Normal breath sounds.  ?Abdominal:  ?   General: Bowel sounds are normal. There is no distension.  ?   Palpations: Abdomen is soft.  ?   Tenderness: There is abdominal tenderness in the right lower quadrant. There is no guarding.  ?Musculoskeletal:     ?   General: No swelling.  ?   Cervical back: Neck supple.  ?Skin: ?   General: Skin is warm and dry.  ?   Capillary Refill: Capillary refill takes less  than 2 seconds.  ?Neurological:  ?   General: No focal deficit present.  ?   Mental Status: She is alert.  ?Psychiatric:     ?   Mood and Affect: Mood normal.  ? ? ?ED Results / Procedures / Treatments   ?Labs ?(all labs ordered are listed, but only abnormal results are displayed) ?Labs Reviewed  ?COMPREHENSIVE METABOLIC PANEL - Abnormal; Notable for the following components:  ?    Result Value  ? Sodium 134 (*)   ? Potassium 3.4 (*)   ? Glucose, Bld 104 (*)   ? Total Bilirubin 2.0 (*)   ? All other components within normal limits  ?URINALYSIS,  ROUTINE W REFLEX MICROSCOPIC - Abnormal; Notable for the following components:  ? APPearance HAZY (*)   ? Nitrite POSITIVE (*)   ? Leukocytes,Ua LARGE (*)   ? Bacteria, UA MANY (*)   ? All other components within normal limits  ?LIPASE, BLOOD  ?CBC  ? ? ?EKG ?None ? ?Radiology ?No results found. ? ?Procedures ?Procedures  ? ? ?Medications Ordered in ED ?Medications  ?sodium chloride flush (NS) 0.9 % injection 3 mL (3 mLs Intravenous Given 07/19/21 0812)  ? ? ?ED Course/ Medical Decision Making/ A&P ?  ?                        ?Medical Decision Making ?Amount and/or Complexity of Data Reviewed ?Labs: ordered. ?Radiology: ordered. ? ?Risk ?Prescription drug management. ? ? ?Patient labs lipase normal complete metabolic panel sodium down a little bit at 134 potassium down a little bit at 3.4.  Total bili is up at 2.0 that has been the case in the past.  Renal function is greater than 60 with a GFR.  No leukocytosis hemoglobin is 13.5.  Urinalysis is hazy and suggestive of urinary tract infection.  No red blood cells.  But does have some bacteria and 21-50 white blood cells. ? ?We will send urine for culture.  We will give a dose of Rocephin here. ? ?But also will get CT scan abdomen pelvis due to the right lower quadrant abdominal pain.  Most likely this is her persistent chronic abdominal pain exacerbated by her chronic pain. ? ?CT scan abdomen and pelvis without any acute  findings to explain the pain.  Appendix is normal.  There is diverticulosis but no diverticulitis. ? ?We will treat patient for the urinary tract infection. ? ? ?Final Clinical Impression(s) / ED Diagnoses ?Final diagnoses:

## 2021-07-19 NOTE — ED Triage Notes (Signed)
Pt c/o right flank pain that started yesterday with n/v. Pt eased and then she woke up with pain again at 0300 this morning. Denies urinary symptoms and fever.  ?

## 2021-07-19 NOTE — Discharge Instructions (Signed)
Follow back up with [facility] gastroenterology as scheduled for follow-up of your gastric emptying test and for further work-up for the vomiting and chronic abdominal pain.  Take Keflex for the urinary tract infection.  Would expect improvement in that over the next couple days.  Follow-up with your primary care doctor to have your urine rechecked.  Take the Phenergan as needed for nausea and vomiting.  And take your pain medications that you have at home. ? ?CT scan abdomen and pelvis today without any acute findings. ?

## 2021-07-19 NOTE — ED Notes (Signed)
Pt c/o feeling nauseated. Offered pt Zofran per triage standing orders, but she reports it doesn't work for her, only Phenergan does. Pt informed per standing triage orders, I can only give Zofran. Pt reports she doesn't want it at this time.  ?

## 2021-07-21 LAB — URINE CULTURE: Culture: >=100000 — AB

## 2021-07-22 ENCOUNTER — Telehealth (HOSPITAL_BASED_OUTPATIENT_CLINIC_OR_DEPARTMENT_OTHER): Payer: Self-pay | Admitting: *Deleted

## 2021-07-22 NOTE — Telephone Encounter (Signed)
Post ED Visit - Positive Culture Follow-up ? ?Culture report reviewed by antimicrobial stewardship pharmacist: ?[place] [facility] ?'[]'$  Elenor Quinones, Pharm.D. ?'[]'$  Heide Guile, Pharm.D., BCPS AQ-ID ?'[]'$  Parks Neptune, Pharm.D., BCPS ?'[]'$  Alycia Rossetti, Pharm.D., BCPS ?'[]'$  [place], Pharm.D., BCPS, AAHIVP ?'[]'$  Legrand Como, Pharm.D., BCPS, AAHIVP ?'[]'$  Salome Arnt, PharmD, BCPS ?'[]'$  Johnnette Gourd, PharmD, BCPS ?'[]'$  Hughes Better, PharmD, BCPS ?'[]'$  Leeroy Cha, PharmD ?'[]'$  Laqueta Linden, PharmD, BCPS ?'[x]'$  Sol Passer PharmD ? ?[place] [facility] ?'[]'$  [address], PharmD ?'[]'$  Leodis Sias, PharmD ?'[]'$  Lindell Spar, PharmD ?'[]'$  Royetta Asal, Rph ?'[]'$  Graylin Shiver) Rema Fendt, PharmD ?'[]'$  Glennon Mac, PharmD ?'[]'$  Arlyn Dunning, PharmD ?'[]'$  Netta Cedars, PharmD ?'[]'$  Dia Sitter, PharmD ?'[]'$  Leone Haven, PharmD ?'[]'$  Gretta Arab, PharmD ?'[]'$  Theodis Shove, PharmD ?'[]'$  Peggyann Juba, PharmD ? ? ?Positive urine culture ?Treated with Cephalexin, organism sensitive to the same and no further patient follow-up is required at this time. ? ?Julia [name] ?07/22/2021, 9:08 AM ?  ?

## 2021-07-23 ENCOUNTER — Telehealth: Payer: Self-pay | Admitting: Internal Medicine

## 2021-07-23 ENCOUNTER — Other Ambulatory Visit: Payer: Self-pay

## 2021-07-23 DIAGNOSIS — K3184 Gastroparesis: Secondary | ICD-10-CM

## 2021-07-23 NOTE — Telephone Encounter (Signed)
Referral placed for nutrition. Pt will receive a call from their office to schedule appt. Pt is welcome to f/u on status of referral by calling [phone]. ?

## 2021-07-23 NOTE — Telephone Encounter (Signed)
We received a call from patient daughter requesting to speak with nurse regarding imaging results from 07/17/21. Please advise. ?

## 2021-07-23 NOTE — Telephone Encounter (Signed)
Called and spoke to the patient as well as his uncle. Her daughter was not available over the telephone. I informed the patient that her recent gastric emptying study showed signs of gastroparesis, which can be caused by a variety of different causes such as medications or a recent infection. I recommended that the patient try to avoid using her oxycodone if possible since this may be causing her delayed gastric emptying. In the meantime I recommended that she eat small meals throughout the daytime. She should try to adhere to low fat diet as well. I will plan to refer her to nutrition to educate her about a gastroparesis diet. ?

## 2021-07-26 ENCOUNTER — Other Ambulatory Visit: Payer: Self-pay

## 2021-07-26 ENCOUNTER — Emergency Department (HOSPITAL_COMMUNITY): Payer: Medicare HMO

## 2021-07-26 ENCOUNTER — Emergency Department (HOSPITAL_COMMUNITY)
Admission: EM | Admit: 2021-07-26 | Discharge: 2021-07-26 | Disposition: A | Discharge status: Home / Self Care | Payer: Medicare HMO | Attending: Emergency Medicine | Admitting: Emergency Medicine

## 2021-07-26 ENCOUNTER — Encounter (HOSPITAL_COMMUNITY): Payer: Self-pay | Admitting: *Deleted

## 2021-07-26 DIAGNOSIS — Z79899 Other long term (current) drug therapy: Secondary | ICD-10-CM | POA: Insufficient documentation

## 2021-07-26 DIAGNOSIS — R202 Paresthesia of skin: Secondary | ICD-10-CM | POA: Diagnosis not present

## 2021-07-26 DIAGNOSIS — R300 Dysuria: Secondary | ICD-10-CM | POA: Insufficient documentation

## 2021-07-26 DIAGNOSIS — M549 Dorsalgia, unspecified: Secondary | ICD-10-CM | POA: Diagnosis not present

## 2021-07-26 DIAGNOSIS — R634 Abnormal weight loss: Secondary | ICD-10-CM | POA: Diagnosis not present

## 2021-07-26 DIAGNOSIS — Z6824 Body mass index (BMI) 24.0-24.9, adult: Secondary | ICD-10-CM | POA: Diagnosis not present

## 2021-07-26 DIAGNOSIS — R109 Unspecified abdominal pain: Secondary | ICD-10-CM | POA: Diagnosis not present

## 2021-07-26 DIAGNOSIS — K5792 Diverticulitis of intestine, part unspecified, without perforation or abscess without bleeding: Secondary | ICD-10-CM | POA: Diagnosis not present

## 2021-07-26 DIAGNOSIS — G939 Disorder of brain, unspecified: Secondary | ICD-10-CM | POA: Diagnosis not present

## 2021-07-26 DIAGNOSIS — D329 Benign neoplasm of meninges, unspecified: Secondary | ICD-10-CM | POA: Diagnosis not present

## 2021-07-26 DIAGNOSIS — Z86011 Personal history of benign neoplasm of the brain: Secondary | ICD-10-CM | POA: Diagnosis not present

## 2021-07-26 DIAGNOSIS — I10 Essential (primary) hypertension: Secondary | ICD-10-CM | POA: Diagnosis not present

## 2021-07-26 DIAGNOSIS — R351 Nocturia: Secondary | ICD-10-CM | POA: Diagnosis not present

## 2021-07-26 DIAGNOSIS — R200 Anesthesia of skin: Secondary | ICD-10-CM

## 2021-07-26 DIAGNOSIS — M3500 Sicca syndrome, unspecified: Secondary | ICD-10-CM | POA: Diagnosis not present

## 2021-07-26 DIAGNOSIS — R350 Frequency of micturition: Secondary | ICD-10-CM | POA: Diagnosis not present

## 2021-07-26 DIAGNOSIS — F4542 Pain disorder with related psychological factors: Secondary | ICD-10-CM | POA: Diagnosis not present

## 2021-07-26 DIAGNOSIS — E2839 Other primary ovarian failure: Secondary | ICD-10-CM | POA: Diagnosis not present

## 2021-07-26 LAB — URINALYSIS, ROUTINE W REFLEX MICROSCOPIC
Bilirubin Urine: NEGATIVE
Glucose, UA: NEGATIVE mg/dL
Hgb urine dipstick: NEGATIVE
Ketones, ur: NEGATIVE mg/dL
Leukocytes,Ua: NEGATIVE
Nitrite: NEGATIVE
Protein, ur: NEGATIVE mg/dL
Specific Gravity, Urine: 1.011 (ref 1.005–1.030)
pH: 7 (ref 5.0–8.0)

## 2021-07-26 LAB — CBC WITH DIFFERENTIAL/PLATELET
Abs Immature Granulocytes: 0.01 10*3/uL (ref 0.00–0.07)
Basophils Absolute: 0 10*3/uL (ref 0.0–0.1)
Basophils Relative: 0 %
Eosinophils Absolute: 0 10*3/uL (ref 0.0–0.5)
Eosinophils Relative: 0 %
HCT: 40.8 % (ref 36.0–46.0)
Hemoglobin: 14.2 g/dL (ref 12.0–15.0)
Immature Granulocytes: 0 %
Lymphocytes Relative: 21 %
Lymphs Abs: 1.1 10*3/uL (ref 0.7–4.0)
MCH: 30.3 pg (ref 26.0–34.0)
MCHC: 34.8 g/dL (ref 30.0–36.0)
MCV: 87.2 fL (ref 80.0–100.0)
Monocytes Absolute: 0.5 10*3/uL (ref 0.1–1.0)
Monocytes Relative: 10 %
Neutro Abs: 3.7 10*3/uL (ref 1.7–7.7)
Neutrophils Relative %: 69 %
Platelets: 247 10*3/uL (ref 150–400)
RBC: 4.68 MIL/uL (ref 3.87–5.11)
RDW: 12.6 % (ref 11.5–15.5)
WBC: 5.3 10*3/uL (ref 4.0–10.5)
nRBC: 0 % (ref 0.0–0.2)

## 2021-07-26 LAB — COMPREHENSIVE METABOLIC PANEL
ALT: 25 U/L (ref 0–44)
AST: 21 U/L (ref 15–41)
Albumin: 4.5 g/dL (ref 3.5–5.0)
Alkaline Phosphatase: 102 U/L (ref 38–126)
Anion gap: 6 (ref 5–15)
BUN: 10 mg/dL (ref 8–23)
CO2: 25 mmol/L (ref 22–32)
Calcium: 10.4 mg/dL — ABNORMAL HIGH (ref 8.9–10.3)
Chloride: 99 mmol/L (ref 98–111)
Creatinine, Ser: 0.84 mg/dL (ref 0.44–1.00)
GFR, Estimated: >60 mL/min (ref >60)
Glucose, Bld: 97 mg/dL (ref 70–99)
Potassium: 3.4 mmol/L — ABNORMAL LOW (ref 3.5–5.1)
Sodium: 130 mmol/L — ABNORMAL LOW (ref 135–145)
Total Bilirubin: 2.1 mg/dL — ABNORMAL HIGH (ref 0.3–1.2)
Total Protein: 7.3 g/dL (ref 6.5–8.1)

## 2021-07-26 NOTE — Discharge Instructions (Signed)
You came to the emergency department today to be evaluated for your perioral numbness.  The MRI of your brain did not show any acute strokes or changes to your meningioma.  Due to your reports of perioral numbness I have given you information to follow-up with neurologist in outpatient setting.  Please call to schedule a follow-up appointment.  Additionally please follow-up closely with your primary care doctor for repeat evaluation.  Your urine sample did not show any signs of infection.  Please continue to take your clindamycin medication as prescribed.  Get help right away if you: Feel muscle weakness. Develop new weakness in an arm or leg. Have trouble walking or moving. Have problems with speech, understanding, or vision. Feel confused. Cannot control your bladder or bowel movements.

## 2021-07-26 NOTE — ED Notes (Signed)
Pt BP 148/101. She states she is on BP medicine; However, she has not taken it today. PA-C made aware

## 2021-07-26 NOTE — ED Provider Notes (Signed)
[facility] Provider Note   CSN: [id number] Arrival date & time: 07/26/21  0846     History  Chief Complaint  Patient presents with   Facial Pain    Julia [name] is a 61 y.o. female with a history of hypertension, seizures (on Topamax), chronic low back pain, benign neoplasm of brain, anxiety, depression.  Presents to the emergency department with a chief complaint of facial numbness.  Patient reports that this morning at approximately 1 to 2 AM she woke up from sleep and noticed numbness to the left corner of her mouth.  Patient states that since then she has had intermittent numbness to this area.  Patient reports that she had no problems with numbness in this area yesterday evening before going to bed at 9 PM.  Patient denies any other numbness, weakness, facial asymmetry, dysarthria, visual disturbance, trouble swallowing, trouble breathing, recent falls or traumatic injuries.  Patient also complains of dysuria and right-sided back pain that radiates to her abdomen.  Patient states that she was seen last week and started on antibiotics for urinary tract infection.  Patient states that the antibiotic she was given in the emergency department caused headaches and therefore her primary care doctor switched the medication.  Patient states that she has had continued dysuria and urinary frequency.  Patient does have a history of chronic back pain, she states that the back pain she is experiencing does feel similar to previous episodes of her chronic pain however states that pain does not usually radiate to right lower quadrant.  Denies any hematuria, vaginal pain, vaginal bleeding, constipation, diarrhea, nausea, vomiting, fever, chills.  HPI     Home Medications Prior to Admission medications   Medication Sig Start Date End Date Taking? Authorizing Provider  albuterol (PROVENTIL HFA;VENTOLIN HFA) 108 (90 Base) MCG/ACT inhaler Inhale 1-2 puffs into the lungs every 6  (six) hours as needed for shortness of breath. 09/18/17   [provider]  alendronate (FOSAMAX) 70 MG tablet Take 1 tablet (70 mg total) by mouth every 7 (seven) days. Take with a full glass of water on an empty stomach. 06/20/21   Cassandria Anger, MD  ALPRAZolam Duanne Moron) 1 MG tablet Take 1 mg by mouth 4 (four) times daily.    [provider]  cephALEXin (KEFLEX) 500 MG capsule Take 1 capsule (500 mg total) by mouth 4 (four) times daily. 07/19/21   Fredia Sorrow, MD  Cholecalciferol (VITAMIN D3) 50 MCG (2000 UT) CAPS Take 1 capsule by mouth 2 (two) times daily.    [provider]  dicyclomine (BENTYL) 20 MG tablet Take 1 tablet (20 mg total) by mouth 2 (two) times daily. 07/10/21   Sharyn Creamer, MD  ezetimibe (ZETIA) 10 MG tablet Take 10 mg by mouth daily.    [provider]  furosemide (LASIX) 20 MG tablet Take 20 mg by mouth daily.    [provider]  gabapentin (NEURONTIN) 300 MG capsule Take by mouth. 05/31/21   [provider]  hydrochlorothiazide (MICROZIDE) 12.5 MG capsule Take 12.5 mg by mouth daily. 05/15/21   [provider]  losartan (COZAAR) 50 MG tablet Take 50 mg by mouth daily.  09/01/13   [provider]  Magnesium 200 MG TABS Take 1 tablet by mouth 2 (two) times daily.    [provider]  Menaquinone-7 (VITAMIN K2 PO) Take 1 tablet by mouth daily.    [provider]  ondansetron (ZOFRAN-ODT) 4 MG disintegrating tablet  Take 4 mg by mouth every 6 (six) hours as needed. 06/21/21   [provider]  oxyCODONE-acetaminophen (PERCOCET/ROXICET) 5-325 MG tablet Take by mouth 2 (two) times daily. 04/27/21   [provider]  pantoprazole (PROTONIX) 40 MG tablet Take 40 mg by mouth 2 (two) times daily. 05/02/21   [provider]  potassium chloride SA (KLOR-CON) 20 MEQ tablet Take 20 mEq by mouth daily. 08/02/20   [provider]  pravastatin (PRAVACHOL) 40 MG tablet Take  40 mg by mouth daily. 06/03/21   [provider]  predniSONE (DELTASONE) 10 MG tablet 6, 5, 4, 3, 2 then 1 tablet by mouth daily for 6 days total. 07/09/21   Idol, Almyra Free, PA-C  promethazine (PHENERGAN) 25 MG suppository Place 1 suppository (25 mg total) rectally every 6 (six) hours as needed for nausea or vomiting. 12/11/20   Milton Ferguson, MD  promethazine (PHENERGAN) 25 MG tablet Take 1 tablet (25 mg total) by mouth every 6 (six) hours as needed for nausea or vomiting. 07/19/21   Fredia Sorrow, MD  propranolol (INDERAL) 20 MG tablet Take 20 mg by mouth daily. 05/31/21   [provider]  topiramate (TOPAMAX) 50 MG tablet Take 50 mg by mouth daily. 05/31/21   [provider]      Allergies    Propranolol, Naprosyn [naproxen], and Penicillins    Review of Systems   Review of Systems  Constitutional:  Negative for chills and fever.  HENT:  Negative for facial swelling.   Eyes:  Negative for visual disturbance.  Respiratory:  Negative for shortness of breath.   Cardiovascular:  Negative for chest pain.  Gastrointestinal:  Negative for abdominal pain, nausea and vomiting.  Genitourinary:  Positive for dysuria and frequency. Negative for difficulty urinating, genital sores, hematuria, vaginal bleeding, vaginal discharge and vaginal pain.  Musculoskeletal:  Positive for back pain. Negative for neck pain.  Skin:  Negative for color change and rash.  Neurological:  Positive for numbness. Negative for dizziness, tremors, seizures, syncope, facial asymmetry, speech difficulty, weakness, light-headedness and headaches.  Psychiatric/Behavioral:  Negative for confusion.    Physical Exam Updated Vital Signs Ht '5\' 1"'$  (1.549 m)   Wt 58.1 kg   LMP 09/06/2012   BMI 24.19 kg/m  Physical Exam Vitals and nursing note reviewed.  Constitutional:      General: She is not in acute distress.    Appearance: She is not ill-appearing, toxic-appearing or diaphoretic.  Eyes:      General:        Right eye: No discharge.        Left eye: No discharge.     Extraocular Movements: Extraocular movements intact.     Conjunctiva/sclera: Conjunctivae normal.     Pupils: Pupils are equal, round, and reactive to light.  Cardiovascular:     Rate and Rhythm: Normal rate.     Pulses:          Radial pulses are 2+ on the right side and 2+ on the left side.  Pulmonary:     Effort: Pulmonary effort is normal. No tachypnea, bradypnea or respiratory distress.     Breath sounds: Normal breath sounds. No stridor.  Abdominal:     General: Abdomen is protuberant. There is no distension. There are no signs of injury.     Palpations: Abdomen is soft. There is no mass or pulsatile mass.     Tenderness: There is no abdominal tenderness. There is no right CVA tenderness, left CVA tenderness,  guarding or rebound.  Musculoskeletal:     Cervical back: Normal range of motion and neck supple. No swelling, edema, deformity, erythema, signs of trauma, lacerations, rigidity, spasms, torticollis, tenderness, bony tenderness or crepitus. No pain with movement. Normal range of motion.     Thoracic back: No swelling, edema, deformity, signs of trauma, lacerations, spasms, tenderness or bony tenderness.     Lumbar back: No swelling, edema, deformity, signs of trauma, lacerations, spasms, tenderness or bony tenderness.  Skin:    General: Skin is warm and dry.  Neurological:     General: No focal deficit present.     Mental Status: She is alert and oriented to person, place, and time.     GCS: GCS eye subscore is 4. GCS verbal subscore is 5. GCS motor subscore is 6.     Cranial Nerves: No cranial nerve deficit or facial asymmetry.     Sensory: Sensation is intact.     Motor: No weakness, tremor, seizure activity or pronator drift.     Coordination: Finger-Nose-Finger Test normal.     Gait: Gait is intact. Gait normal.     Comments: CN II-XII intact, equal grip strength, +5 strength to bilateral upper  and lower extremities    Psychiatric:        Behavior: Behavior is cooperative.    ED Results / Procedures / Treatments   Labs (all labs ordered are listed, but only abnormal results are displayed) Labs Reviewed  COMPREHENSIVE METABOLIC PANEL - Abnormal; Notable for the following components:      Result Value   Sodium 130 (*)    Potassium 3.4 (*)    Calcium 10.4 (*)    Total Bilirubin 2.1 (*)    All other components within normal limits  URINE CULTURE  URINALYSIS, ROUTINE W REFLEX MICROSCOPIC  CBC WITH DIFFERENTIAL/PLATELET    EKG None  Radiology MR BRAIN WO CONTRAST  Result Date: 07/26/2021 CLINICAL DATA:  Left-sided mouth pain and strange feeling, stroke suspected EXAM: MRI HEAD WITHOUT CONTRAST TECHNIQUE: Multiplanar, multiecho pulse sequences of the brain and surrounding structures were obtained without intravenous contrast. COMPARISON:  CT head 11/04/2020, brain MRI 02/06/2016 FINDINGS: Brain: There is no acute intracranial hemorrhage, extra-axial fluid collection, or acute infarct. Parenchymal volume is normal. The ventricles are normal in size. Gray-white differentiation is preserved. Scattered foci of FLAIR signal abnormality in the subcortical and periventricular white matter likely reflects sequela of moderate chronic white matter microangiopathy. A 1.1 cm extra-axial lesion overlying the right frontal lobe is consistent with a meningioma, not significantly changed since 2017. There is no mass effect on the underlying brain parenchyma. No other solid lesions are seen. There is no midline shift. Vascular: Normal flow voids. Skull and upper cervical spine: Normal marrow signal. Sinuses/Orbits: The paranasal sinuses are clear. The globes and orbits are unremarkable. Other: None. IMPRESSION: 1. No acute intracranial pathology. 2. Moderate underlying chronic white matter microangiopathy. 3. Unchanged 1.1 cm right frontal meningioma. Electronically Signed   By: Valetta Mole M.D.   On:  07/26/2021 11:40    Procedures Procedures    Medications Ordered in ED Medications - No data to display  ED Course/ Medical Decision Making/ A&P                           Medical Decision Making Amount and/or Complexity of Data Reviewed Labs: ordered. Radiology: ordered.   We will 61 year old female in no acute distress, nontoxic-appearing.  Presents to  the emergency department with numbness to face and urinary symptoms.  Information obtained from patient and patient's daughter at bedside.  Past medical records were reviewed including previous provider notes, labs, and imaging.  Patient has medical history as outlined in HPI which complicates her care.  Patient reports that she was sent to the emergency department to rule out stroke due to reports of numbness to her face.  On my exam patient has no focal neurological deficits.  Sensation to light touch grossly intact to patient's face.  With known history of benign brain tumor in the setting of new neurologic complaint will obtain MRI imaging for further evaluation.  Per chart review patient was seen on 07/19/2021 for complaints of right lower quadrant abdominal pain.  Patient had urinalysis that was consistent with UTI.  Microscopy grew out E. coli.  Patient was being treated with Keflex.  However reports that her PCP switched her to a different antibiotic due to reports of headache with Keflex.  Patient reports taking all antibiotics as prescribed.  Patient reports that she is having continued dysuria and urinary frequency.  Will check urinalysis, urine culture, CMP, and CBC.  I personally viewed and interpreted patient's lab results.  Pertinent findings include: -Urinalysis shows no signs of infection -CBC unremarkable -Hyponatremia at 130; appears baseline for patient. -Total bili 2.1, appears baseline for patient  MRI imaging of brain shows no acute intracranial pathology.  Unchanged 1.1 cm right frontal meningioma.  Patient has  further clindamycin medication to take for her urinary tract infection.  Patient advised to continue this medication.  Low suspicion for pyelonephritis at this time as urinalysis shows no signs of infection.  With MRI showing no signs of stroke unclear etiology of patient's numbness.  Patient given information to follow-up with neurology in outpatient setting.  Additionally will have patient follow-up with PCP.  Patient care discussed with attending physician Dr. Alvino Chapel.  Based on patient's chief complaint, I considered admission might be necessary, however after reassuring ED workup feel patient is reasonable for discharge.  Discussed results, findings, treatment and follow up. Patient advised of return precautions. Patient verbalized understanding and agreed with plan.  Portions of this note were generated with Lobby[employer]. Dictation errors may occur despite best attempts at proofreading.         Final Clinical Impression(s) / ED Diagnoses Final diagnoses:  None    Rx / DC Orders ED Discharge Orders     None         Dyann Ruddle 07/26/21 1321    Davonna Belling, MD 07/26/21 1536

## 2021-07-26 NOTE — ED Triage Notes (Signed)
Pt saw her doctor this morning and sent to the hospital for possible stroke. Pt reports "sometime during the night" the left side of her mouth started feeling different. She is unable to describe exactly what she is feeling. She denies numbness and pain, but also reports the left side of her mouth as a 7/10 on a pain scale. Denies weakness, numbness to arms and legs. Pt reports she went to bed at 2100 last night and she didn't have this feeling in her mouth at this time. Pt is also c/o lower back that radiates down her legs. Pt reports she was recently diagnosed with a UTI but she doesn't feel the medication is working.

## 2021-07-27 LAB — URINE CULTURE: Culture: <10000 — AB

## 2021-08-19 DIAGNOSIS — F322 Major depressive disorder, single episode, severe without psychotic features: Secondary | ICD-10-CM | POA: Diagnosis not present

## 2021-08-19 DIAGNOSIS — R109 Unspecified abdominal pain: Secondary | ICD-10-CM | POA: Diagnosis not present

## 2021-08-19 DIAGNOSIS — G894 Chronic pain syndrome: Secondary | ICD-10-CM | POA: Diagnosis not present

## 2021-08-19 DIAGNOSIS — Z6824 Body mass index (BMI) 24.0-24.9, adult: Secondary | ICD-10-CM | POA: Diagnosis not present

## 2021-08-19 DIAGNOSIS — F4542 Pain disorder with related psychological factors: Secondary | ICD-10-CM | POA: Diagnosis not present

## 2021-08-19 DIAGNOSIS — F419 Anxiety disorder, unspecified: Secondary | ICD-10-CM | POA: Diagnosis not present

## 2021-08-19 DIAGNOSIS — M5136 Other intervertebral disc degeneration, lumbar region: Secondary | ICD-10-CM | POA: Diagnosis not present

## 2021-08-19 DIAGNOSIS — D510 Vitamin B12 deficiency anemia due to intrinsic factor deficiency: Secondary | ICD-10-CM | POA: Diagnosis not present

## 2021-08-19 DIAGNOSIS — T50905A Adverse effect of unspecified drugs, medicaments and biological substances, initial encounter: Secondary | ICD-10-CM | POA: Diagnosis not present

## 2021-08-21 ENCOUNTER — Ambulatory Visit: Payer: Medicare HMO | Admitting: Internal Medicine

## 2021-08-22 ENCOUNTER — Encounter: Payer: Medicare HMO | Attending: Internal Medicine | Admitting: Nutrition

## 2021-08-22 NOTE — Progress Notes (Signed)
Medical Nutrition Therapy  Appointment Start time:  1345  Appointment End time:  1445  Primary concerns today: Gastroparesis  Referral diagnosis: K31.84 Preferred learning style: see and hear  Learning readiness: Ready    NUTRITION ASSESSMENT  61 yr old wfemale referred for gastroparesis. PCP:  Dr. Sherwood Gambler. GI Dr. Mauri Brooklyn, Evaluated by GI. Complains of feeling bloated and GI irritation. Diet is high in processed foods. Stays hungry. Wt stable  Anthropometrics  Wt Readings from Last 3 Encounters:  08/22/21 127 lb (57.6 kg)  07/26/21 128 lb (58.1 kg)  07/19/21 128 lb (58.1 kg)   Ht Readings from Last 3 Encounters:  08/22/21 5' (1.524 m)  07/26/21 5\' 1"  (1.549 m)  07/19/21 5\' 1"  (1.549 m)   Body mass index is 24.8 kg/m. @BMIFA @ Facility age limit for growth %iles is 20 years. Facility age limit for growth %iles is 20 years.    Clinical Medical Hx: pitched nerve.  Medications: See chart Labs:  Lifestyle & Dietary Hx   Estimated daily fluid intake: 4-5 oz Supplements: Vit D Sleep:  Stress / self-care:  stressed about family Current average weekly physical activity:  ADL  24-Hr Dietary Recall First Meal: Fruit cup and nabs,  Snack:  Second Meal: Chicken slider , fries, sweet tea,  Snack: cakes, desserts, Third Meal: Cheese burger and french fries Snack:  Beverages: water, sweet tea, sprite  Estimated Energy Needs Calories: 1600 Carbohydrate: 180g Protein: 120g Fat: 44g   NUTRITION DIAGNOSIS  [id number] Predicted excessive nutrient intake As related to highly processed foods .  As evidenced by gastroparesisi.   NUTRITION INTERVENTION  Nutrition education (E-1) on the following topics:  Lifestyle Medicine  - Whole Food, Plant Predominant Nutrition is highly recommended: Eat Plenty of vegetables, Mushrooms, fruits, Legumes, Whole Grains, Nuts, seeds in lieu of processed meats, processed snacks/pastries red meat, poultry, eggs.    -It is better to avoid  simple carbohydrates including: Cakes, Sweet Desserts, Ice Cream, Soda (diet and regular), Sweet Tea, Candies, Chips, Cookies, Store Bought Juices, Alcohol in Excess of  1-2 drinks a day, Lemonade,  Artificial Sweeteners, Doughnuts, Coffee Creamers, "Sugar-free" Products, etc, etc.  This is not a complete list.....  Exercise: If you are able: 30 -60 minutes a day ,4 days a week, or 150 minutes a week.  The longer the better.  Combine stretch, strength, and aerobic activities.  If you were told in the past that you have high risk for cardiovascular diseases, you may seek evaluation by your heart doctor prior to initiating moderate to intense exercise programs.   My Plate, portion sizes, balanced meals   Handouts Provided Include  Lifestyle Medicine.  Learning Style & Readiness for Change Teaching method utilized: Visual & Auditory  Demonstrated degree of understanding via: Teach Back  Barriers to learning/adherence to lifestyle change: None  Goals Established by Pt Goals  Cut out fried foods and processed foods Cut out junk food Drink only water No sodas Increase fresh fruits and vegetables Eat small , frequent meals Walk 15 minutes after a meals.   MONITORING & EVALUATION Dietary intake, weekly physical activity, and GI issuew in 1-2 month.  Next Steps  Patient is to work on eating more whole plant based foods. Marland Kitchen

## 2021-08-22 NOTE — Patient Instructions (Addendum)
Goals  Cut out fried foods and processed foods Cut out junk food Drink only water No sodas Increase fresh fruits and vegetables Eat small , frequent meals Walk 15 minutes after a meals.

## 2021-09-02 DIAGNOSIS — G4459 Other complicated headache syndrome: Secondary | ICD-10-CM | POA: Diagnosis not present

## 2021-09-02 DIAGNOSIS — Z79891 Long term (current) use of opiate analgesic: Secondary | ICD-10-CM | POA: Diagnosis not present

## 2021-09-02 DIAGNOSIS — K5903 Drug induced constipation: Secondary | ICD-10-CM | POA: Diagnosis not present

## 2021-09-02 DIAGNOSIS — M542 Cervicalgia: Secondary | ICD-10-CM | POA: Diagnosis not present

## 2021-09-02 DIAGNOSIS — Q8500 Neurofibromatosis, unspecified: Secondary | ICD-10-CM | POA: Diagnosis not present

## 2021-09-02 DIAGNOSIS — I10 Essential (primary) hypertension: Secondary | ICD-10-CM | POA: Diagnosis not present

## 2021-09-02 DIAGNOSIS — M545 Low back pain, unspecified: Secondary | ICD-10-CM | POA: Diagnosis not present

## 2021-09-02 DIAGNOSIS — G43701 Chronic migraine without aura, not intractable, with status migrainosus: Secondary | ICD-10-CM | POA: Diagnosis not present

## 2021-09-02 DIAGNOSIS — D329 Benign neoplasm of meninges, unspecified: Secondary | ICD-10-CM | POA: Diagnosis not present

## 2021-09-17 DIAGNOSIS — E213 Hyperparathyroidism, unspecified: Secondary | ICD-10-CM | POA: Diagnosis not present

## 2021-09-17 DIAGNOSIS — Z6823 Body mass index (BMI) 23.0-23.9, adult: Secondary | ICD-10-CM | POA: Diagnosis not present

## 2021-09-17 DIAGNOSIS — G894 Chronic pain syndrome: Secondary | ICD-10-CM | POA: Diagnosis not present

## 2021-09-17 DIAGNOSIS — M5136 Other intervertebral disc degeneration, lumbar region: Secondary | ICD-10-CM | POA: Diagnosis not present

## 2021-09-17 DIAGNOSIS — L209 Atopic dermatitis, unspecified: Secondary | ICD-10-CM | POA: Diagnosis not present

## 2021-09-17 DIAGNOSIS — I10 Essential (primary) hypertension: Secondary | ICD-10-CM | POA: Diagnosis not present

## 2021-09-26 ENCOUNTER — Ambulatory Visit
Admission: EM | Admit: 2021-09-26 | Discharge: 2021-09-26 | Disposition: A | Discharge status: Home / Self Care | Payer: Medicare HMO | Attending: Family Medicine | Admitting: Family Medicine

## 2021-09-26 DIAGNOSIS — K130 Diseases of lips: Secondary | ICD-10-CM | POA: Diagnosis not present

## 2021-09-26 DIAGNOSIS — B370 Candidal stomatitis: Secondary | ICD-10-CM

## 2021-09-26 MED ORDER — NYSTATIN 100000 UNIT/ML MT SUSP: 500000.0000 [IU] | Freq: Four times a day (QID) | OROMUCOSAL | 0 refills | Status: DC

## 2021-09-26 NOTE — ED Triage Notes (Signed)
Pt presents with c/o thrush on tongue. Pt states she had some teeth pulled weeks ago and states she has been taking antibiotics. Pt reports she was suppose to eat yogurt while on the antibiotics and did not eat yogurt, states she thinks her not eating yogurt caused thrush.

## 2021-09-26 NOTE — ED Provider Notes (Signed)
[facility]    CSN: [id number] Arrival date & time: 09/26/21  1238      History   Chief Complaint Chief Complaint  Patient presents with   Thrush    tongue    HPI Julia [name] is a 61 y.o. female.   Presenting today with several day history of white coated tongue, tongue soreness after taking several courses of antibiotics from the dentist after some dental work.  She denies fever, chills, difficulty breathing or swallowing.  Has not tried anything over-the-counter for symptoms.  Her daughter told her that she had thrush.  She also states that her lips have been chapped and dry for the past several months despite frequent use of Carmex.  No bleeding, peeling, cracking.    Past Medical History:  Diagnosis Date   Anxiety    Brain tumor (benign) (HCC)    Chronic pain    Depression    Elevated cholesterol    Headache(784.0)    Hypertension    Numerous moles    Osteoporosis    Seizures ([place])    Trichimoniasis     Patient Active Problem List   Diagnosis Date Noted   Other hyperparathyroidism ([place]) 06/20/2021   Localized osteoporosis without current pathological fracture 06/20/2021   Hypercalcemia 05/30/2021   Abdominal pain 11/05/2020   Nausea & vomiting 11/05/2020   Hypokalemia 11/05/2020   Lactic acidosis 11/05/2020   Leukocytosis 11/05/2020   Hyperglycemia 11/05/2020   Elevated AST (SGOT) 11/05/2020   Hyperlipidemia 11/05/2020   Anxiety 11/05/2020   Seizure ([place]) 11/04/2020   Long-term current use of opiate analgesic 11/04/2020   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   Chronic headache disorder 11/04/2020   Chronic low back pain 11/04/2020   Essential hypertension 11/04/2020   Migraine without aura 11/04/2020   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   Pain radiating to neck 11/04/2020   Neck pain 04/19/2020   Thickened endometrium 06/17/2017   Screening for colorectal cancer 06/09/2017   Encounter for gynecological examination  with Papanicolaou smear of cervix 06/09/2017   PMB (postmenopausal bleeding) 06/09/2017   Drug-induced constipation 09/30/2012   Colon cancer screening 09/30/2012   Trichomoniasis of vagina 08/31/2012   Pelvic relaxation due to cystocele, midline 06/02/2012   Rectocele 06/02/2012    Past Surgical History:  Procedure Laterality Date   EXCISION OF SKIN TAG N/A 09/22/2017   Procedure: EXCISION OF SKIN TAGS ON FACE AND NECK (Procedure #2);  Surgeon: Jonnie Kind, MD;  Location: [facility];  Service: Gynecology;  Laterality: N/A;   HYSTEROSCOPY WITH D & C N/A 09/22/2017   Procedure: DILATATION AND CURETTAGE /HYSTEROSCOPY; EXCISION OF VULVAR AND RIGHT AND LEFT THIGH SKIN TAGS (Procedure #1);  Surgeon: Jonnie Kind, MD;  Location: [facility];  Service: Gynecology;  Laterality: N/A;   POLYPECTOMY N/A 09/22/2017   Procedure: REMOVAL OF ENDOMETRIAL POLYP (Procedure #1);  Surgeon: Jonnie Kind, MD;  Location: [facility];  Service: Gynecology;  Laterality: N/A;   TUBAL LIGATION      OB History     Gravida  3   Para  3   Term      Preterm      AB      Living  3      SAB      IAB      Ectopic      Multiple      Live Births  Home Medications    Prior to Admission medications   Medication Sig Start Date End Date Taking? Authorizing Provider  nystatin (MYCOSTATIN) 100000 UNIT/ML suspension Take 5 mLs (500,000 Units total) by mouth 4 (four) times daily. 09/26/21  Yes Volney American, PA-C  albuterol (PROVENTIL HFA;VENTOLIN HFA) 108 (90 Base) MCG/ACT inhaler Inhale 1-2 puffs into the lungs every 6 (six) hours as needed for shortness of breath. 09/18/17   [provider]  alendronate (FOSAMAX) 70 MG tablet Take 1 tablet (70 mg total) by mouth every 7 (seven) days. Take with a full glass of water on an empty stomach. 06/20/21   Cassandria Anger, MD  ALPRAZolam Duanne Moron) 1 MG tablet Take 1 mg by mouth 4 (four) times daily.    [provider]   cephALEXin (KEFLEX) 500 MG capsule Take 1 capsule (500 mg total) by mouth 4 (four) times daily. Patient not taking: Reported on 08/22/2021 07/19/21   Fredia Sorrow, MD  Cholecalciferol (VITAMIN D3) 50 MCG (2000 UT) CAPS Take 1 capsule by mouth 2 (two) times daily.    [provider]  dicyclomine (BENTYL) 20 MG tablet Take 1 tablet (20 mg total) by mouth 2 (two) times daily. 07/10/21   Sharyn Creamer, MD  ezetimibe (ZETIA) 10 MG tablet Take 10 mg by mouth daily.    [provider]  furosemide (LASIX) 20 MG tablet Take 20 mg by mouth daily.    [provider]  hydrochlorothiazide (MICROZIDE) 12.5 MG capsule Take 12.5 mg by mouth daily. 05/15/21   [provider]  losartan (COZAAR) 50 MG tablet Take 50 mg by mouth daily.  09/01/13   [provider]  mirtazapine (REMERON SOL-TAB) 30 MG disintegrating tablet Take 30 mg by mouth at bedtime.    [provider]  oxyCODONE-acetaminophen (PERCOCET/ROXICET) 5-325 MG tablet Take 1 tablet by mouth every 4 (four) hours as needed for moderate pain. 04/27/21   [provider]  pantoprazole (PROTONIX) 40 MG tablet Take 40 mg by mouth 2 (two) times daily. 05/02/21   [provider]  potassium chloride SA (KLOR-CON) 20 MEQ tablet Take 20 mEq by mouth daily. 08/02/20   [provider]  pravastatin (PRAVACHOL) 40 MG tablet Take 40 mg by mouth daily. 06/03/21   [provider]  predniSONE (DELTASONE) 10 MG tablet 6, 5, 4, 3, 2 then 1 tablet by mouth daily for 6 days total. Patient not taking: Reported on 07/26/2021 07/09/21   Evalee Jefferson, PA-C  promethazine (PHENERGAN) 25 MG suppository Place 1 suppository (25 mg total) rectally every 6 (six) hours as needed for nausea or vomiting. Patient not taking: Reported on 08/22/2021 12/11/20   Milton Ferguson, MD  promethazine (PHENERGAN) 25 MG tablet Take 1 tablet (25 mg total) by mouth every 6 (six) hours as needed for nausea or vomiting. Patient not  taking: Reported on 08/22/2021 07/19/21   Fredia Sorrow, MD  propranolol (INDERAL) 20 MG tablet Take 20 mg by mouth daily. 05/31/21   [provider]  topiramate (TOPAMAX) 50 MG tablet Take 50 mg by mouth daily. Patient not taking: Reported on 08/22/2021 05/31/21   [provider]    Family History Family History  Problem Relation Age of Onset   Stroke Mother    Heart failure Mother    Cancer Father        lung   Diabetes Brother     Social History Social History   Tobacco Use   Smoking status: Never   Smokeless  tobacco: Never  Vaping Use   Vaping Use: Never used  Substance Use Topics   Alcohol use: No   Drug use: No     Allergies   Propranolol, Naprosyn [naproxen], and Penicillins   Review of Systems Review of Systems PER HPI  Physical Exam Triage Vital Signs ED Triage Vitals [09/26/21 1423]  Enc Vitals Group     BP 118/81     Pulse Rate 85     Resp 17     Temp 98.1 F (36.7 C)     Temp Source Oral     SpO2 98 %     Weight      Height      Head Circumference      Peak Flow      Pain Score 0     Pain Loc      Pain Edu?      Excl. in [place]?    No data found.  Updated Vital Signs BP 118/81 (BP Location: Right Arm)   Pulse 85   Temp 98.1 F (36.7 C) (Oral)   Resp 17   LMP 09/06/2012   SpO2 98%   Visual Acuity Right Eye Distance:   Left Eye Distance:   Bilateral Distance:    Right Eye Near:   Left Eye Near:    Bilateral Near:     Physical Exam Vitals and nursing note reviewed.  Constitutional:      Appearance: Normal appearance. She is not ill-appearing.  HENT:     Head: Atraumatic.     Mouth/Throat:     Mouth: Mucous membranes are moist.     Comments: Normal-appearing lips, white coated tongue, mildly erythematous and injected Eyes:     Extraocular Movements: Extraocular movements intact.     Conjunctiva/sclera: Conjunctivae normal.  Cardiovascular:     Rate and Rhythm: Normal rate and regular rhythm.     Heart  sounds: Normal heart sounds.  Pulmonary:     Effort: Pulmonary effort is normal.     Breath sounds: Normal breath sounds.  Musculoskeletal:        General: Normal range of motion.     Cervical back: Normal range of motion and neck supple.  Skin:    General: Skin is warm and dry.  Neurological:     Mental Status: She is alert and oriented to person, place, and time.  Psychiatric:        Mood and Affect: Mood normal.        Thought Content: Thought content normal.        Judgment: Judgment normal.      UC Treatments / Results  Labs (all labs ordered are listed, but only abnormal results are displayed) Labs Reviewed - No data to display  EKG   Radiology No results found.  Procedures Procedures (including critical care time)  Medications Ordered in UC Medications - No data to display  Initial Impression / Assessment and Plan / UC Course  I have reviewed the triage vital signs and the nursing notes.  Pertinent labs & imaging results that were available during my care of the patient were reviewed by me and considered in my medical decision making (see chart for details).     Discussed to discontinue Carmex, trial of Vaseline or Aquaphor once to twice daily for good moisturization of the lips.  Nystatin mouthwash given for suspected thrush.  Discussed probiotics.  Final Clinical Impressions(s) / UC Diagnoses   Final diagnoses:  Thrush  Lip dryness  Discharge Instructions   None    ED Prescriptions     Medication Sig Dispense Auth. Provider   nystatin (MYCOSTATIN) 100000 UNIT/ML suspension Take 5 mLs (500,000 Units total) by mouth 4 (four) times daily. 120 mL Volney American, [place]      PDMP not reviewed this encounter.   Volney American, [place] 09/26/21 1455

## 2021-10-03 ENCOUNTER — Encounter: Payer: Medicare HMO | Attending: Internal Medicine | Admitting: Nutrition

## 2021-10-03 VITALS — Ht 60.0 in | Wt 126.6 lb

## 2021-10-03 DIAGNOSIS — R7401 Elevation of levels of liver transaminase levels: Secondary | ICD-10-CM | POA: Insufficient documentation

## 2021-10-03 DIAGNOSIS — E782 Mixed hyperlipidemia: Secondary | ICD-10-CM | POA: Insufficient documentation

## 2021-10-03 DIAGNOSIS — I10 Essential (primary) hypertension: Secondary | ICD-10-CM | POA: Insufficient documentation

## 2021-10-03 DIAGNOSIS — R739 Hyperglycemia, unspecified: Secondary | ICD-10-CM | POA: Insufficient documentation

## 2021-10-03 DIAGNOSIS — K3184 Gastroparesis: Secondary | ICD-10-CM | POA: Insufficient documentation

## 2021-10-03 NOTE — Progress Notes (Signed)
Medical [facility]   Appointment End time:  1200  Primary concerns today: Gastroparesis  Referral diagnosis: K31.84 Preferred learning style: see and hear  Learning readiness: Ready    NUTRITION ASSESSMENT Follow up Gastroparesis and elevated liver enzymes. Feels like she is starving herself and staying hungry. No significant weight loss. Misses her fried foods and salty foods. Wants  her vegetables from a can and not frozen.  Has been cutting out greasy and fast foods. Stomach has been a little better. Not taking her VIt D 3 anymore. Has been getting more of Healthy Choice meals to eat.  Diet is low in high fiber foods and fruits and vegetables contributing to her hunger. Diet is insuffient to meet her needs.   Anthropometrics  Wt Readings from Last 3 Encounters:  08/22/21 127 lb (57.6 kg)  07/26/21 128 lb (58.1 kg)  07/19/21 128 lb (58.1 kg)   Ht Readings from Last 3 Encounters:  08/22/21 5' (1.524 m)  07/26/21 '5\' 1"'$  (1.549 m)  07/19/21 '5\' 1"'$  (1.549 m)   There is no height or weight on file to calculate BMI. '@BMIFA'$ @ Facility age limit for growth %iles is 20 years. Facility age limit for growth %iles is 20 years.    Clinical Medical QA:[id number], HTN, Hyperlipidemia, elevated liver enzymes, pinched nerve.  Medications: See chart Labs: No results found for: "HGBA1C"    Latest Ref Rng & Units 07/26/2021   10:14 AM 07/19/2021    8:12 AM 07/09/2021   10:07 AM  CMP  Glucose 70 - 99 mg/dL 97  104  108   BUN 8 - 23 mg/dL '10  10  9   '$ Creatinine 0.44 - 1.00 mg/dL 0.84  0.81  0.83   Sodium 135 - 145 mmol/L 130  134  133   Potassium 3.5 - 5.1 mmol/L 3.4  3.4  3.7   Chloride 98 - 111 mmol/L 99  103  102   CO2 22 - 32 mmol/L '25  25  25   '$ Calcium 8.9 - 10.3 mg/dL 10.4  10.1  10.8   Total Protein 6.5 - 8.1 g/dL 7.3  6.8  7.1   Total Bilirubin 0.3 - 1.2 mg/dL 2.1  2.0  1.3   Alkaline Phos 38 - 126 U/L 102  92  99   AST 15 - 41 U/L '21  22  18   '$ ALT 0 - 44 U/L '25  31   22     '$ Notable Signs/Symptoms: bloated and stomach pains. Constipation  Lifestyle & Dietary Hx Lives by herself. Her daughter helps her with groceries. She cooks some at home but prefers not to [employer]. Is too tired.  Estimated daily fluid intake: 20 oz Supplements: Vit D Sleep: varies Stress / self-care:  stressed about family Current average weekly physical activity:  ADL  24-Hr Dietary Recall First Meal: Egg sandwich or cherrios with almond milk. Snack: vanilla pudding or applesauce Second Meal: Hasn't eaten lunch yet:  Sandwiches,  Third Meal: Fish sandwich, water Beverages: water,    Estimated Energy Needs Calories: 1500 Carbohydrate: 170g Protein: 112g Fat: 42g   NUTRITION DIAGNOSIS  NB-1.1 Food and nutrition-related knowledge deficit As related to Gastroparesis.  As evidenced by bloating, nausea and abdominal pain per GI Doctor and a high fat diet and elevated liver enzmes .   NUTRITION INTERVENTION  Nutrition education (E-1) on the following topics:   Low fat, high fiber diet. More whole plant based foods and avoiding processed, manufactured foods.  Lifestyle  Medicine  - Whole Food, Plant Predominant Nutrition is highly recommended: Eat Plenty of vegetables, Mushrooms, fruits, Legumes, Whole Grains, Nuts, seeds in lieu of processed meats, processed snacks/pastries red meat, poultry, eggs.    -It is better to avoid simple carbohydrates including: Cakes, Sweet Desserts, Ice Cream, Soda (diet and regular), Sweet Tea, Candies, Chips, Cookies, Store Bought Juices, Alcohol in Excess of  1-2 drinks a day, Lemonade,  Artificial Sweeteners, Doughnuts, Coffee Creamers, "Sugar-free" Products, etc, etc.  This is not a complete list.....  Exercise: If you are able: 30 -60 minutes a day ,4 days a week, or 150 minutes a week.  The longer the better.  Combine stretch, strength, and aerobic activities.  If you were told in the past that you have high risk for cardiovascular diseases,  you may seek evaluation by your heart doctor prior to initiating moderate to intense exercise programs.   Handouts Provided Include  Lifestyle medicine handouts. Nutrition therapy for NASH handout.  Learning Style & Readiness for Change Teaching method utilized: Visual & Auditory  Demonstrated degree of understanding via: Teach Back  Barriers to learning/adherence to lifestyle change: none  Goals Established by Pt  Keep up the good job Increase fruits, vegetables and whole grains. Keep drinking water Avoid processed and fried foods Eat three meals per day' B) around 9 am, Lunch 12-2 and Dinner 5-7 pm.   MONITORING & EVALUATION Dietary intake, weekly physical activity, and weight in 1 month.  Next Steps  Patient is to work on meal planning and meal prepping.Marland Kitchen

## 2021-10-03 NOTE — Patient Instructions (Signed)
Goals  Keep up the good job Increase fruits, vegetables and whole grains. Keep drinking water Avoid processed and fried foods Eat three meals per day' B) around 9 am, Lunch 12-2 and Dinner 5-7 pm.

## 2021-10-09 DIAGNOSIS — W57XXXA Bitten or stung by nonvenomous insect and other nonvenomous arthropods, initial encounter: Secondary | ICD-10-CM | POA: Diagnosis not present

## 2021-10-09 DIAGNOSIS — I10 Essential (primary) hypertension: Secondary | ICD-10-CM | POA: Diagnosis not present

## 2021-10-09 DIAGNOSIS — Z6824 Body mass index (BMI) 24.0-24.9, adult: Secondary | ICD-10-CM | POA: Diagnosis not present

## 2021-10-09 DIAGNOSIS — R569 Unspecified convulsions: Secondary | ICD-10-CM | POA: Diagnosis not present

## 2021-10-17 ENCOUNTER — Other Ambulatory Visit (HOSPITAL_COMMUNITY): Payer: Self-pay | Admitting: Internal Medicine

## 2021-10-17 DIAGNOSIS — Z1231 Encounter for screening mammogram for malignant neoplasm of breast: Secondary | ICD-10-CM

## 2021-10-24 ENCOUNTER — Ambulatory Visit (HOSPITAL_COMMUNITY)
Admission: RE | Admit: 2021-10-24 | Discharge: 2021-10-24 | Disposition: A | Discharge status: Home / Self Care | Payer: Medicare HMO | Source: Ambulatory Visit | Attending: Internal Medicine | Admitting: Internal Medicine

## 2021-10-24 DIAGNOSIS — Z1231 Encounter for screening mammogram for malignant neoplasm of breast: Secondary | ICD-10-CM | POA: Insufficient documentation

## 2021-10-25 DIAGNOSIS — M5136 Other intervertebral disc degeneration, lumbar region: Secondary | ICD-10-CM | POA: Diagnosis not present

## 2021-10-30 ENCOUNTER — Other Ambulatory Visit: Payer: Self-pay

## 2021-10-30 ENCOUNTER — Encounter (HOSPITAL_COMMUNITY): Payer: Self-pay | Admitting: Emergency Medicine

## 2021-10-30 ENCOUNTER — Emergency Department (HOSPITAL_COMMUNITY)
Admission: EM | Admit: 2021-10-30 | Discharge: 2021-10-31 | Disposition: A | Discharge status: Home / Self Care | Payer: Medicare HMO | Attending: Emergency Medicine | Admitting: Emergency Medicine

## 2021-10-30 ENCOUNTER — Emergency Department (HOSPITAL_COMMUNITY): Payer: Medicare HMO

## 2021-10-30 DIAGNOSIS — E871 Hypo-osmolality and hyponatremia: Secondary | ICD-10-CM | POA: Diagnosis not present

## 2021-10-30 DIAGNOSIS — I10 Essential (primary) hypertension: Secondary | ICD-10-CM | POA: Diagnosis not present

## 2021-10-30 DIAGNOSIS — K5792 Diverticulitis of intestine, part unspecified, without perforation or abscess without bleeding: Secondary | ICD-10-CM | POA: Diagnosis not present

## 2021-10-30 DIAGNOSIS — R1032 Left lower quadrant pain: Secondary | ICD-10-CM | POA: Diagnosis present

## 2021-10-30 DIAGNOSIS — K5730 Diverticulosis of large intestine without perforation or abscess without bleeding: Secondary | ICD-10-CM | POA: Diagnosis not present

## 2021-10-30 DIAGNOSIS — Z85841 Personal history of malignant neoplasm of brain: Secondary | ICD-10-CM | POA: Insufficient documentation

## 2021-10-30 DIAGNOSIS — N2889 Other specified disorders of kidney and ureter: Secondary | ICD-10-CM | POA: Diagnosis not present

## 2021-10-30 LAB — COMPREHENSIVE METABOLIC PANEL
ALT: 25 U/L (ref 0–44)
AST: 24 U/L (ref 15–41)
Albumin: 4.8 g/dL (ref 3.5–5.0)
Alkaline Phosphatase: 70 U/L (ref 38–126)
Anion gap: 10 (ref 5–15)
BUN: 14 mg/dL (ref 8–23)
CO2: 24 mmol/L (ref 22–32)
Calcium: 11.5 mg/dL — ABNORMAL HIGH (ref 8.9–10.3)
Chloride: 96 mmol/L — ABNORMAL LOW (ref 98–111)
Creatinine, Ser: 0.84 mg/dL (ref 0.44–1.00)
GFR, Estimated: >60 mL/min (ref >60)
Glucose, Bld: 105 mg/dL — ABNORMAL HIGH (ref 70–99)
Potassium: 3.3 mmol/L — ABNORMAL LOW (ref 3.5–5.1)
Sodium: 130 mmol/L — ABNORMAL LOW (ref 135–145)
Total Bilirubin: 1.8 mg/dL — ABNORMAL HIGH (ref 0.3–1.2)
Total Protein: 8.1 g/dL (ref 6.5–8.1)

## 2021-10-30 LAB — URINALYSIS, ROUTINE W REFLEX MICROSCOPIC
Bilirubin Urine: NEGATIVE
Glucose, UA: NEGATIVE mg/dL
Ketones, ur: 5 mg/dL — AB
Nitrite: NEGATIVE
Protein, ur: NEGATIVE mg/dL
Specific Gravity, Urine: 1.011 (ref 1.005–1.030)
pH: 6 (ref 5.0–8.0)

## 2021-10-30 LAB — CBC
HCT: 40.6 % (ref 36.0–46.0)
Hemoglobin: 14.5 g/dL (ref 12.0–15.0)
MCH: 30.7 pg (ref 26.0–34.0)
MCHC: 35.7 g/dL (ref 30.0–36.0)
MCV: 86 fL (ref 80.0–100.0)
Platelets: 280 10*3/uL (ref 150–400)
RBC: 4.72 MIL/uL (ref 3.87–5.11)
RDW: 12.1 % (ref 11.5–15.5)
WBC: 7.7 10*3/uL (ref 4.0–10.5)
nRBC: 0 % (ref 0.0–0.2)

## 2021-10-30 LAB — LIPASE, BLOOD: Lipase: 38 U/L (ref 11–51)

## 2021-10-30 MED ORDER — SODIUM CHLORIDE 0.9 % IV SOLN
1.0000 g | Freq: Once | INTRAVENOUS | Status: AC
  Administered 2021-10-30: 1 g via INTRAVENOUS
  Filled 2021-10-30: qty 10

## 2021-10-30 MED ORDER — SODIUM CHLORIDE 0.9 % IV BOLUS
1000.0000 mL | Freq: Once | INTRAVENOUS | Status: AC
  Administered 2021-10-30: 1000 mL via INTRAVENOUS

## 2021-10-30 MED ORDER — HYDROMORPHONE HCL 1 MG/ML IJ SOLN
1.0000 mg | Freq: Once | INTRAMUSCULAR | Status: AC
  Administered 2021-10-30: 1 mg via INTRAVENOUS
  Filled 2021-10-30: qty 1

## 2021-10-30 MED ORDER — KETOROLAC TROMETHAMINE 30 MG/ML IJ SOLN
30.0000 mg | Freq: Once | INTRAMUSCULAR | Status: AC
  Administered 2021-10-30: 30 mg via INTRAMUSCULAR
  Filled 2021-10-30: qty 1

## 2021-10-30 MED ORDER — IOHEXOL 300 MG/ML  SOLN
100.0000 mL | Freq: Once | INTRAMUSCULAR | Status: AC | PRN
  Administered 2021-10-30: 100 mL via INTRAVENOUS

## 2021-10-30 NOTE — ED Notes (Signed)
Pt to ct 

## 2021-10-30 NOTE — ED Notes (Signed)
ED Provider at bedside. 

## 2021-10-30 NOTE — ED Provider Triage Note (Signed)
Emergency Medicine Provider Triage Evaluation Note  Julia [name] , a 61 y.o. female  was evaluated in triage.  Pt complains of sharp pain in the suprapubic region, started intermittent on Monday but is now constant and severe. Denies dysuria or hematuria. Denies fever, chills, nausea, or vomiting. Able to tolerate PO. Last BM today. Has had UTI's but not often and none in the last month.   Review of Systems  Positive: Abdominal pain  Negative: Fever, chills, nausea, or vomiting.   Physical Exam  BP (!) 138/90 (BP Location: Right Arm)   Pulse 96   Temp 98.1 F (36.7 C) (Oral)   Resp 18   Ht 5' (1.524 m)   Wt 57.2 kg   LMP 09/06/2012   SpO2 100%   BMI 24.61 kg/m  Gen:   Awake, no distress   Resp:  Normal effort  MSK:   Moves extremities without difficulty  Other:  Soft abdomen, tenderness in lower suprapubic region, no guarding, no rigidity.   Medical Decision Making  Medically screening exam initiated at 8:13 PM.  Appropriate orders placed.  Farris Has was informed that the remainder of the evaluation will be completed by another provider, this initial triage assessment does not replace that evaluation, and the importance of remaining in the ED until their evaluation is complete.     Lianne Cure, DO 42/59/56 2016

## 2021-10-30 NOTE — ED Triage Notes (Signed)
Pt c/o abd pain and pointing to suprapubic pain x 2 days. C/o lower back pain and pain shooting down left leg. No urinary changes. nad

## 2021-10-30 NOTE — ED Provider Notes (Signed)
[facility] Emergency Department Provider Note MRN:  [id number]  Arrival date & time: 10/31/21     Chief Complaint   Abdominal Pain   History of Present Illness   Julia [name] is a 61 y.o. year-old female with a history of seizure disorder, hypertension, brain tumor presenting to the ED with chief complaint of abdominal pain.  Left lower quadrant and suprapubic abdominal pain for the past 2 days.  No fever, no nausea vomiting or diarrhea, no numbness or weakness to the arms or legs.  Pain severe this evening  Review of Systems  A thorough review of systems was obtained and all systems are negative except as noted in the HPI and PMH.   Patient's Health History    Past Medical History:  Diagnosis Date   Anxiety    Brain tumor (benign) (HCC)    Chronic pain    Depression    Elevated cholesterol    Headache(784.0)    Hypertension    Numerous moles    Osteoporosis    Seizures ([place])    Trichimoniasis     Past Surgical History:  Procedure Laterality Date   EXCISION OF SKIN TAG N/A 09/22/2017   Procedure: EXCISION OF SKIN TAGS ON FACE AND NECK (Procedure #2);  Surgeon: Jonnie Kind, MD;  Location: [facility];  Service: Gynecology;  Laterality: N/A;   HYSTEROSCOPY WITH D & C N/A 09/22/2017   Procedure: DILATATION AND CURETTAGE /HYSTEROSCOPY; EXCISION OF VULVAR AND RIGHT AND LEFT THIGH SKIN TAGS (Procedure #1);  Surgeon: Jonnie Kind, MD;  Location: [facility];  Service: Gynecology;  Laterality: N/A;   POLYPECTOMY N/A 09/22/2017   Procedure: REMOVAL OF ENDOMETRIAL POLYP (Procedure #1);  Surgeon: Jonnie Kind, MD;  Location: [facility];  Service: Gynecology;  Laterality: N/A;   TUBAL LIGATION      Family History  Problem Relation Age of Onset   Stroke Mother    Heart failure Mother    Cancer Father        lung   Diabetes Brother     Social History   Socioeconomic History   Marital status: Widowed    Spouse name: Not on file   Number of  children: 3   Years of education: Not on file   Highest education level: Not on file  Occupational History   Not on file  Tobacco Use   Smoking status: Never   Smokeless tobacco: Never  Vaping Use   Vaping Use: Never used  Substance and Sexual Activity   Alcohol use: No   Drug use: No   Sexual activity: Not Currently    Birth control/protection: Post-menopausal, Surgical    Comment: tubal  Other Topics Concern   Not on file  Social History Narrative   Not on file   Social Determinants of Health   Financial Resource Strain: Not on file  Food Insecurity: Not on file  Transportation Needs: Not on file  Physical Activity: Not on file  Stress: Not on file  Social Connections: Not on file  Intimate Partner Violence: Not on file     Physical Exam   Vitals:   10/30/21 2330 10/31/21 0045  BP: (!) 149/91   Pulse:  69  Resp:    Temp:    SpO2:  100%    CONSTITUTIONAL: Chronically ill-appearing, NAD NEURO/PSYCH:  Alert and oriented x 3, no focal deficits EYES:  eyes equal and reactive ENT/NECK:  no LAD, no JVD CARDIO: Regular rate, well-perfused, normal  S1 and S2 PULM:  CTAB no wheezing or rhonchi GI/GU:  non-distended, non-tender MSK/SPINE:  No gross deformities, no edema SKIN:  no rash, atraumatic   *Additional and/or pertinent findings included in MDM below  Diagnostic and Interventional Summary    EKG Interpretation  Date/Time:    Ventricular Rate:    PR Interval:    QRS Duration:   QT Interval:    QTC Calculation:   R Axis:     Text Interpretation:         Labs Reviewed  COMPREHENSIVE METABOLIC PANEL - Abnormal; Notable for the following components:      Result Value   Sodium 130 (*)    Potassium 3.3 (*)    Chloride 96 (*)    Glucose, Bld 105 (*)    Calcium 11.5 (*)    Total Bilirubin 1.8 (*)    All other components within normal limits  URINALYSIS, ROUTINE W REFLEX MICROSCOPIC - Abnormal; Notable for the following components:   APPearance  HAZY (*)    Hgb urine dipstick SMALL (*)    Ketones, ur 5 (*)    Leukocytes,Ua LARGE (*)    Bacteria, UA MANY (*)    All other components within normal limits  CBC  LIPASE, BLOOD    CT ABDOMEN PELVIS W CONTRAST  Final Result      Medications  ketorolac (TORADOL) 30 MG/ML injection 30 mg (30 mg Intramuscular Given 10/30/21 2026)  iohexol (OMNIPAQUE) 300 MG/ML solution 100 mL (100 mLs Intravenous Contrast Given 10/30/21 2333)  HYDROmorphone (DILAUDID) injection 1 mg (1 mg Intravenous Given 10/30/21 2328)  cefTRIAXone (ROCEPHIN) 1 g in sodium chloride 0.9 % 100 mL IVPB (0 g Intravenous Stopped 10/31/21 0049)  sodium chloride 0.9 % bolus 1,000 mL (1,000 mLs Intravenous New Bag/Given 10/30/21 2327)     Procedures  /  Critical Care Procedures  ED Course and Medical Decision Making  Initial Impression and Ddx Differential diagnosis includes diverticulitis, UTI, pyelonephritis, kidney stone, awaiting labs, CT.  Past medical/surgical history that increases complexity of ED encounter: Chronic pain  Interpretation of Diagnostics I personally reviewed the laboratory assessment and my interpretation is as follows: Mild hyponatremia, otherwise no significant blood count or electrolyte disturbance, urinalysis with some evidence to suggest infection.  CT revealing diverticulitis.  Patient Reassessment and Ultimate Disposition/Management     Patient feeling a lot better, appropriate for discharge on antibiotics.  Patient management required discussion with the following services or consulting groups:  None  Complexity of Problems Addressed Acute illness or injury that poses threat of life of bodily function  Additional Data Reviewed and Analyzed Further history obtained from: Past medical history and medications listed in the EMR, Prior ED visit notes, and Prior labs/imaging results  Additional Factors Impacting ED Encounter Risk Prescriptions and Consideration of  hospitalization  Barth Kirks. Sedonia Small, MD [place] [email or web address]  Final Clinical Impressions(s) / ED Diagnoses     ICD-10-CM   1. Diverticulitis  K57.92       ED Discharge Orders          Ordered    cefdinir (OMNICEF) 300 MG capsule  2 times daily        10/31/21 0125    metroNIDAZOLE (FLAGYL) 500 MG tablet  2 times daily        10/31/21 0125             Discharge Instructions Discussed with and Provided to Patient:  Discharge Instructions      You were evaluated in the Emergency Department and after careful evaluation, we did not find any emergent condition requiring admission or further testing in the hospital.  Your exam/testing today was overall reassuring.  Symptoms seem to be due to diverticulitis.  Please take the Omnicef and Flagyl antibiotics as directed.  Please return to the Emergency Department if you experience any worsening of your condition.  Thank you for allowing [place] to be a part of your care.        Maudie Flakes, MD 10/31/21 [phone]

## 2021-10-31 DIAGNOSIS — K5792 Diverticulitis of intestine, part unspecified, without perforation or abscess without bleeding: Secondary | ICD-10-CM | POA: Diagnosis not present

## 2021-10-31 MED ORDER — CEFDINIR 300 MG PO CAPS: 300.0000 mg | ORAL_CAPSULE | Freq: Two times a day (BID) | ORAL | 0 refills | Status: AC

## 2021-10-31 MED ORDER — METRONIDAZOLE 500 MG PO TABS: 500.0000 mg | ORAL_TABLET | Freq: Two times a day (BID) | ORAL | 0 refills | Status: AC

## 2021-10-31 NOTE — Discharge Instructions (Signed)
You were evaluated in the Emergency Department and after careful evaluation, we did not find any emergent condition requiring admission or further testing in the hospital.  Your exam/testing today was overall reassuring.  Symptoms seem to be due to diverticulitis.  Please take the Omnicef and Flagyl antibiotics as directed.  Please return to the Emergency Department if you experience any worsening of your condition.  Thank you for allowing [place] to be a part of your care.

## 2021-11-06 ENCOUNTER — Ambulatory Visit (INDEPENDENT_AMBULATORY_CARE_PROVIDER_SITE_OTHER): Payer: Medicare HMO | Admitting: Adult Health

## 2021-11-06 ENCOUNTER — Ambulatory Visit: Payer: Medicare HMO | Admitting: Adult Health

## 2021-11-06 ENCOUNTER — Encounter: Payer: Self-pay | Admitting: Adult Health

## 2021-11-06 VITALS — BP 129/91 | HR 83 | Ht 61.0 in | Wt 119.0 lb

## 2021-11-06 DIAGNOSIS — K649 Unspecified hemorrhoids: Secondary | ICD-10-CM | POA: Insufficient documentation

## 2021-11-06 NOTE — Progress Notes (Addendum)
  Subjective:     Patient ID: Julia [name], female   DOB: 1961/02/25, 61 y.o.   MRN: [id number]  HPI Gracie is a 61 year old white female, widowed, PM in complaining of hemorrhoids, [name] had diarrhea from antibiotics for diverticulitis. She is nervous today. She says she needs to be in assisted living.  PCP is Dr Gerarda Fraction.  Review of Systems +Hemorrhoids  Reviewed past medical,surgical, social and family history. Reviewed medications and allergies.     Objective:   Physical Exam BP (!) 129/91 (BP Location: Left Arm, Patient Position: Sitting, Cuff Size: Normal)   Pulse 83   Ht '5\' 1"'$  (1.549 m)   Wt 119 lb (54 kg)   LMP 09/06/2012   BMI 22.48 kg/m     Skin warm and dry, [name] numerous AKs and moles and 1 area looks like horn, to see dermatology 11/18/21. On rectal exam [name] small external hemorrhoid and internal hemorrhoid and irritation around rectal area.  [facility] - 11/06/21 1541       Pregnancy Intention Screening   Does the patient want to become pregnant in the next year? No    Does the patient's partner want to become pregnant in the next year? No    Would the patient like to discuss contraceptive options today? No      Contraception Wrap Up   Current Method Female Sterilization    End Method Female Sterilization    Contraception Counseling Provided No            Examination chaperoned by Diona Fanti CMA.  Assessment:     1. Hemorrhoids, unspecified hemorrhoid type [name] hemorrhoids Called in compounded hemorrhoid cream to [facility] #30 gm tube, use 3-4 x daily with 1 RF Use warm compress or frozen peas/ice pack   Pat do not rub     Plan:     Follow up in September for pap and physical

## 2021-11-15 ENCOUNTER — Other Ambulatory Visit: Payer: Self-pay

## 2021-11-15 ENCOUNTER — Emergency Department (HOSPITAL_COMMUNITY)
Admission: EM | Admit: 2021-11-15 | Discharge: 2021-11-15 | Disposition: A | Discharge status: Home / Self Care | Payer: Medicare HMO | Attending: Emergency Medicine | Admitting: Emergency Medicine

## 2021-11-15 ENCOUNTER — Encounter (HOSPITAL_COMMUNITY): Payer: Self-pay | Admitting: *Deleted

## 2021-11-15 DIAGNOSIS — M542 Cervicalgia: Secondary | ICD-10-CM | POA: Insufficient documentation

## 2021-11-15 DIAGNOSIS — I10 Essential (primary) hypertension: Secondary | ICD-10-CM | POA: Diagnosis not present

## 2021-11-15 DIAGNOSIS — M5412 Radiculopathy, cervical region: Secondary | ICD-10-CM | POA: Insufficient documentation

## 2021-11-15 DIAGNOSIS — R238 Other skin changes: Secondary | ICD-10-CM | POA: Diagnosis not present

## 2021-11-15 DIAGNOSIS — M79602 Pain in left arm: Secondary | ICD-10-CM | POA: Diagnosis not present

## 2021-11-15 LAB — URINALYSIS, ROUTINE W REFLEX MICROSCOPIC
Bilirubin Urine: NEGATIVE
Glucose, UA: NEGATIVE mg/dL
Hgb urine dipstick: NEGATIVE
Ketones, ur: NEGATIVE mg/dL
Leukocytes,Ua: NEGATIVE
Nitrite: NEGATIVE
Protein, ur: NEGATIVE mg/dL
Specific Gravity, Urine: 1.008 (ref 1.005–1.030)
pH: 6 (ref 5.0–8.0)

## 2021-11-15 MED ORDER — METHOCARBAMOL 500 MG PO TABS: 500.0000 mg | ORAL_TABLET | Freq: Two times a day (BID) | ORAL | 0 refills | Status: DC

## 2021-11-15 NOTE — ED Triage Notes (Signed)
Pt with left arm pain that comes and goes, not able to state for how long.  Also with bilateral groin pain.  + burns with urination.

## 2021-11-15 NOTE — Discharge Instructions (Addendum)
Please use Tylenol or ibuprofen for pain.  You may use 600 mg ibuprofen every 6 hours or 1000 mg of Tylenol every 6 hours.  You may choose to alternate between the 2.  This would be most effective.  Not to exceed 4 g of Tylenol within 24 hours.  Not to exceed 3200 mg ibuprofen 24 hours.  You can use the muscle relaxant I am prescribing in addition to the above to help with any breakthrough pain.  You can take it up to twice daily.  It is safe to take at night, but I would be cautious taking it during the day as it can cause some drowsiness.  Make sure that you are feeling awake and alert before you get behind the wheel of a car or operate a motor vehicle.  It is not a narcotic pain medication so you are able to take it if it is not making you drowsy and still pilot a vehicle or machinery safely.  For the irritation you described in your groin folds I recommend using Desitin which is a barrier cream to help prevent rubbing of your skin.  I recommend following up with your primary care doctor and the doctor that you report is helping you with your neck to discuss any additional treatment for your neck pain and arm pain.  I have attached some stretching exercises that you can use to hopefully help with the left arm pain.  Please return to the emergency department you begin having chest pain, shortness of breath.

## 2021-11-15 NOTE — ED Provider Notes (Signed)
[facility] EMERGENCY DEPARTMENT Provider Note   CSN: [id number] Arrival date & time: 11/15/21  1522     History  Chief Complaint  Patient presents with   Arm Pain    Julia [name] is a 61 y.o. female with past medical history significant for anxiety, hypertension, headache, benign brain tumor, history of seizures on seizure prophylaxis, osteoporosis, neurofibromatosis, who presents with concern for some intermittent left arm pain, neck pain for months, worse over the last 2 days.  Patient also endorses some itching, irritation in the groin.  She had endorsed in triage some dysuria, but denies dysuria with me.  She denies any vaginal discharge, new vaginal bleeding, dyspareunia.  She reports that she has not tried anything for the arm pain.  She has a history of pinched nerve in the neck previously diagnosed. She denies any recent new neck injury.  She denies history of IV drug use, cancer, chronic corticosteroid use, recent fever.  She denies any chest pain, shortness of breath, arm pain is not worse with exertion, she has not had any nausea, vomiting.   Arm Pain       Home Medications Prior to Admission medications   Medication Sig Start Date End Date Taking? Authorizing Provider  methocarbamol (ROBAXIN) 500 MG tablet Take 1 tablet (500 mg total) by mouth 2 (two) times daily. 11/15/21  Yes Sharnese Heath H, PA-C  albuterol (PROVENTIL HFA;VENTOLIN HFA) 108 (90 Base) MCG/ACT inhaler Inhale 1-2 puffs into the lungs every 6 (six) hours as needed for shortness of breath. 09/18/17   [provider]  alendronate (FOSAMAX) 70 MG tablet Take 1 tablet (70 mg total) by mouth every 7 (seven) days. Take with a full glass of water on an empty stomach. 06/20/21   Cassandria Anger, MD  ALPRAZolam Duanne Moron) 1 MG tablet Take 1 mg by mouth 4 (four) times daily.    [provider]  dicyclomine (BENTYL) 20 MG tablet Take 1 tablet (20 mg total) by mouth 2 (two) times daily. 07/10/21    Sharyn Creamer, MD  ezetimibe (ZETIA) 10 MG tablet Take 10 mg by mouth daily.    [provider]  furosemide (LASIX) 20 MG tablet Take 20 mg by mouth daily.    [provider]  hydrochlorothiazide (MICROZIDE) 12.5 MG capsule Take 12.5 mg by mouth daily. 05/15/21   [provider]  losartan (COZAAR) 50 MG tablet Take 50 mg by mouth daily.  09/01/13   [provider]  mirtazapine (REMERON SOL-TAB) 30 MG disintegrating tablet Take 30 mg by mouth at bedtime.    [provider]  nystatin (MYCOSTATIN) 100000 UNIT/ML suspension Take 5 mLs (500,000 Units total) by mouth 4 (four) times daily. 09/26/21   Volney American, PA-C  oxyCODONE-acetaminophen (PERCOCET/ROXICET) 5-325 MG tablet Take 1 tablet by mouth every 4 (four) hours as needed for moderate pain. 04/27/21   [provider]  pantoprazole (PROTONIX) 40 MG tablet Take 40 mg by mouth 2 (two) times daily. 05/02/21   [provider]  potassium chloride SA (KLOR-CON) 20 MEQ tablet Take 20 mEq by mouth daily. 08/02/20   [provider]  pravastatin (PRAVACHOL) 40 MG tablet Take 40 mg by mouth daily. 06/03/21   [provider]  predniSONE (DELTASONE) 10 MG tablet 6, 5, 4, 3, 2 then 1 tablet by mouth daily for 6 days total. 07/09/21   Idol, Almyra Free, PA-C  promethazine (PHENERGAN) 25 MG suppository Place 1 suppository (25 mg total) rectally every 6 (  six) hours as needed for nausea or vomiting. Patient not taking: Reported on 11/06/2021 12/11/20   Milton Ferguson, MD  promethazine (PHENERGAN) 25 MG tablet Take 1 tablet (25 mg total) by mouth every 6 (six) hours as needed for nausea or vomiting. 07/19/21   Fredia Sorrow, MD  propranolol (INDERAL) 20 MG tablet Take 20 mg by mouth daily. 05/31/21   [provider]      Allergies    Propranolol, Naprosyn [naproxen], and Penicillins    Review of Systems   Review of Systems  Musculoskeletal:  Positive for arthralgias and neck  pain.  Skin:  Positive for rash.  All other systems reviewed and are negative.   Physical Exam Updated Vital Signs BP (!) 146/87 (BP Location: Right Arm)   Pulse 79   Temp 97.7 F (36.5 C) (Oral)   Resp 20   Ht '5\' 1"'$  (1.549 m)   Wt 54.4 kg   LMP 09/06/2012   SpO2 94%   BMI 22.67 kg/m  Physical Exam Vitals and nursing note reviewed.  Constitutional:      General: She is not in acute distress.    Appearance: Normal appearance.  HENT:     Head: Normocephalic and atraumatic.  Eyes:     General:        Right eye: No discharge.        Left eye: No discharge.  Cardiovascular:     Rate and Rhythm: Normal rate and regular rhythm.     Heart sounds: No murmur heard.    No friction rub. No gallop.  Pulmonary:     Effort: Pulmonary effort is normal.     Breath sounds: Normal breath sounds.  Abdominal:     General: Bowel sounds are normal.     Palpations: Abdomen is soft.  Musculoskeletal:     Comments: Patient with no significant tenderness to palpation midline cervical spine, she has some paraspinous muscle tenderness in the cervical region.  She has intact strength 5 out of 5 bilateral upper and lower extremities.  No step-off or deformity of the neck.  Skin:    General: Skin is warm and dry.     Capillary Refill: Capillary refill takes less than 2 seconds.     Comments: Patient with some mild redness, irritation in bilateral inguinal creases.  No evidence of cellulitis, purulent drainage, or other quality of the groin.  Neurological:     Mental Status: She is alert and oriented to person, place, and time.  Psychiatric:        Mood and Affect: Mood normal.        Behavior: Behavior normal.     ED Results / Procedures / Treatments   Labs (all labs ordered are listed, but only abnormal results are displayed) Labs Reviewed  URINALYSIS, ROUTINE W REFLEX MICROSCOPIC    EKG None  Radiology No results found.  Procedures Procedures    Medications Ordered in  ED Medications - No data to display  ED Course/ Medical Decision Making/ A&P                           Medical Decision Making Amount and/or Complexity of Data Reviewed Labs: ordered.   This is a somewhat chronically ill 61 year old female with past medical history significant for anxiety, hypertension, headache, benign brain tumor, history of seizures on prophylaxis, osteoporosis, neurofibromatosis who presents with intermittent left arm pain on and off for several weeks, history of known  pinched nerve in cervical region previously evaluated.  Emergent referral diagnosis includes cervical radiculopathy, other left arm injury, neck pain related symptoms, considered atypical ACS presentation, pneumonia, spinal cord compression, versus other.  On exam patient with no signs of exertional chest pain, chest pain at all, denies any shortness of breath, nausea, vomiting.  I have low clinical suspicion for acute ACS or other cardiac abnormality based on patient's symptoms.  Given that she has a history of cervical radiculopathy symptoms do seem consistent with ongoing radicular pain.  Additionally patient was concerned about ongoing thrush in the mouth, on my exam she has no evidence of thrush, discussed that her previously diagnosed thrush is likely adequately treated at this time.  She additionally endorses some irritation of the groin, questionable dysuria.  I dependently interpreted urinalysis which shows no evidence of urinary tract infection, on physical exam patient has some evidence of mild skin irritation at bilateral inguinal creases, considered early intertrigo versus other skin fold irritation from rubbing.  Patient with no evidence of satellite lesions, or significant redness to suggest active yeast infection, encourage Desitin barrier cream for irritation.  We will discharge with ibuprofen, Tylenol, Robaxin for cervical radiculopathy, considered steroid burst but patient declined saying that it  gives her bad side effects.  Encouraged her to follow-up with orthopedic doctor for further evaluation of her neck pain.  Patient understands and agrees to plan, she is neurovascularly intact throughout, and stable at time of discharge with mild hypertension of 146/87. Final Clinical Impression(s) / ED Diagnoses Final diagnoses:  Cervical radiculopathy  Left arm pain  Skin irritation    Rx / DC Orders ED Discharge Orders          Ordered    methocarbamol (ROBAXIN) 500 MG tablet  2 times daily        11/15/21 1907              Dorien Chihuahua 11/15/21 Clarnce Flock, MD 11/16/21 1203

## 2021-11-18 DIAGNOSIS — B078 Other viral warts: Secondary | ICD-10-CM | POA: Diagnosis not present

## 2021-11-18 DIAGNOSIS — C44319 Basal cell carcinoma of skin of other parts of face: Secondary | ICD-10-CM | POA: Diagnosis not present

## 2021-11-18 DIAGNOSIS — D2239 Melanocytic nevi of other parts of face: Secondary | ICD-10-CM | POA: Diagnosis not present

## 2021-11-23 ENCOUNTER — Emergency Department (HOSPITAL_COMMUNITY)
Admission: EM | Admit: 2021-11-23 | Discharge: 2021-11-23 | Disposition: A | Discharge status: Home / Self Care | Payer: Medicare HMO | Attending: Student | Admitting: Student

## 2021-11-23 DIAGNOSIS — G8929 Other chronic pain: Secondary | ICD-10-CM | POA: Insufficient documentation

## 2021-11-23 DIAGNOSIS — Z79899 Other long term (current) drug therapy: Secondary | ICD-10-CM | POA: Diagnosis not present

## 2021-11-23 DIAGNOSIS — I10 Essential (primary) hypertension: Secondary | ICD-10-CM | POA: Diagnosis not present

## 2021-11-23 DIAGNOSIS — J029 Acute pharyngitis, unspecified: Secondary | ICD-10-CM | POA: Diagnosis not present

## 2021-11-23 DIAGNOSIS — Z20822 Contact with and (suspected) exposure to covid-19: Secondary | ICD-10-CM | POA: Diagnosis not present

## 2021-11-23 LAB — RESP PANEL BY RT-PCR (FLU A&B, COVID) ARPGX2
Influenza A by PCR: NEGATIVE
Influenza B by PCR: NEGATIVE
SARS Coronavirus 2 by RT PCR: NEGATIVE

## 2021-11-23 LAB — GROUP A STREP BY PCR: Group A Strep by PCR: NOT DETECTED

## 2021-11-23 NOTE — ED Provider Notes (Signed)
[facility] EMERGENCY DEPARTMENT Provider Note  CSN: [id number] Arrival date & time: 11/23/21 0249  Chief Complaint(s) Sore Throat  HPI Julia [name] is a 61 y.o. female who presents emergency department for evaluation of a sore throat.  Patient states that she has young children in her home that have upper respiratory symptoms and she has had a sore throat for the last 24 hours.  She states that she is able to swallow and eat without difficulty.  No muffled voice or decreased range of motion of her neck.  Denies fever, chest pain, shortness of breath, Donnell pain, nausea, vomiting or other systemic symptoms.   Past Medical History Past Medical History:  Diagnosis Date   Anxiety    Brain tumor (benign) (HCC)    Chronic pain    Depression    Elevated cholesterol    Headache(784.0)    Hypertension    Numerous moles    Osteoporosis    Seizures ([place])    Trichimoniasis    Patient Active Problem List   Diagnosis Date Noted   Hemorrhoids 11/06/2021   Other hyperparathyroidism ([place]) 06/20/2021   Localized osteoporosis without current pathological fracture 06/20/2021   Hypercalcemia 05/30/2021   Abdominal pain 11/05/2020   Nausea & vomiting 11/05/2020   Hypokalemia 11/05/2020   Lactic acidosis 11/05/2020   Leukocytosis 11/05/2020   Hyperglycemia 11/05/2020   Elevated AST (SGOT) 11/05/2020   Hyperlipidemia 11/05/2020   Anxiety 11/05/2020   Seizure ([place]) 11/04/2020   Long-term current use of opiate analgesic 11/04/2020   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   Chronic headache disorder 11/04/2020   Chronic low back pain 11/04/2020   Essential hypertension 11/04/2020   Migraine without aura 11/04/2020   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 Authorizing Provider  albuterol (PROVENTIL HFA;VENTOLIN HFA) 108 (90 Base) MCG/ACT inhaler Inhale 1-2 puffs into the lungs every 6 (six) hours as needed for shortness of breath. 09/18/17   [provider]  alendronate (FOSAMAX) 70 MG tablet Take 1 tablet (70 mg total) by mouth every 7 (seven) days. Take with a full glass of water on an empty stomach. 06/20/21   Cassandria Anger, MD  ALPRAZolam Duanne Moron) 1 MG tablet Take 1 mg by mouth 4 (four) times daily.    [provider]  dicyclomine (BENTYL) 20 MG tablet Take 1 tablet (20 mg total) by mouth 2 (two) times daily. 07/10/21   Sharyn Creamer, MD  ezetimibe (ZETIA) 10 MG tablet Take 10 mg by mouth daily.    [provider]  furosemide (LASIX) 20 MG tablet Take 20 mg by mouth daily.    [provider]  hydrochlorothiazide (MICROZIDE) 12.5 MG capsule Take 12.5 mg by mouth daily. 05/15/21   [provider]  losartan (COZAAR) 50 MG tablet Take 50 mg by mouth daily.  09/01/13   [provider]  methocarbamol (ROBAXIN) 500 MG tablet Take 1 tablet (500 mg total) by mouth 2 (two) times daily. 11/15/21   Prosperi,  Christian H, PA-C  mirtazapine (REMERON SOL-TAB) 30 MG disintegrating tablet Take 30 mg by mouth at bedtime.    [provider]  nystatin (MYCOSTATIN) 100000 UNIT/ML suspension Take 5 mLs (500,000 Units total) by mouth 4 (four) times daily. 09/26/21   Volney American, PA-C  oxyCODONE-acetaminophen (PERCOCET/ROXICET) 5-325 MG tablet Take 1 tablet by mouth every 4 (four) hours as needed for moderate pain. 04/27/21   [provider]   pantoprazole (PROTONIX) 40 MG tablet Take 40 mg by mouth 2 (two) times daily. 05/02/21   [provider]  potassium chloride SA (KLOR-CON) 20 MEQ tablet Take 20 mEq by mouth daily. 08/02/20   [provider]  pravastatin (PRAVACHOL) 40 MG tablet Take 40 mg by mouth daily. 06/03/21   [provider]  predniSONE (DELTASONE) 10 MG tablet 6, 5, 4, 3, 2 then 1 tablet by mouth daily for 6 days total. 07/09/21   Idol, Almyra Free, PA-C  promethazine (PHENERGAN) 25 MG suppository Place 1 suppository (25 mg total) rectally every 6 (six) hours as needed for nausea or vomiting. Patient not taking: Reported on 11/06/2021 12/11/20   Milton Ferguson, MD  promethazine (PHENERGAN) 25 MG tablet Take 1 tablet (25 mg total) by mouth every 6 (six) hours as needed for nausea or vomiting. 07/19/21   Fredia Sorrow, MD  propranolol (INDERAL) 20 MG tablet Take 20 mg by mouth daily. 05/31/21   [provider]                                                                                                                                    Past Surgical History Past Surgical History:  Procedure Laterality Date   EXCISION OF SKIN TAG N/A 09/22/2017   Procedure: EXCISION OF SKIN TAGS ON FACE AND NECK (Procedure #2);  Surgeon: Jonnie Kind, MD;  Location: [facility];  Service: Gynecology;  Laterality: N/A;   HYSTEROSCOPY WITH D & C N/A 09/22/2017   Procedure: DILATATION AND CURETTAGE /HYSTEROSCOPY; EXCISION OF VULVAR AND RIGHT AND LEFT THIGH SKIN TAGS (Procedure #1);  Surgeon: Jonnie Kind, MD;  Location: [facility];  Service: Gynecology;  Laterality: N/A;   POLYPECTOMY N/A 09/22/2017   Procedure: REMOVAL OF ENDOMETRIAL POLYP (Procedure #1);  Surgeon: Jonnie Kind, MD;  Location: [facility];  Service: Gynecology;  Laterality: N/A;   TUBAL LIGATION     Family History Family History  Problem Relation Age of Onset   Cancer Father        lung   Stroke Mother    Heart failure Mother    Cancer Brother     Diabetes Brother     Social History Social History   Tobacco Use   Smoking status: Never   Smokeless tobacco: Never  Vaping Use   Vaping Use: Never used  Substance Use Topics   Alcohol use: No   Drug use: No   Allergies Propranolol, Naprosyn [naproxen], and  Penicillins  Review of Systems Review of Systems  HENT:  Positive for sore throat.     Physical Exam Vital Signs  I have reviewed the triage vital signs BP (!) 129/93 (BP Location: Right Arm)   Pulse 83   Temp 97.7 F (36.5 C) (Oral)   Resp 18   Ht '5\' 1"'$  (1.549 m)   Wt 54.4 kg   LMP 09/06/2012   SpO2 99%   BMI 22.67 kg/m   Physical Exam Vitals and nursing note reviewed.  Constitutional:      General: She is not in acute distress.    Appearance: She is well-developed.  HENT:     Head: Normocephalic and atraumatic.     Mouth/Throat:     Pharynx: Posterior oropharyngeal erythema present.  Eyes:     Conjunctiva/sclera: Conjunctivae normal.  Cardiovascular:     Rate and Rhythm: Normal rate and regular rhythm.     Heart sounds: No murmur heard. Pulmonary:     Effort: Pulmonary effort is normal. No respiratory distress.     Breath sounds: Normal breath sounds.  Abdominal:     Palpations: Abdomen is soft.     Tenderness: There is no abdominal tenderness.  Musculoskeletal:        General: No swelling.     Cervical back: Neck supple.  Skin:    General: Skin is warm and dry.     Capillary Refill: Capillary refill takes less than 2 seconds.  Neurological:     Mental Status: She is alert.  Psychiatric:        Mood and Affect: Mood normal.     ED Results and Treatments Labs (all labs ordered are listed, but only abnormal results are displayed) Labs Reviewed  RESP PANEL BY RT-PCR (FLU A&B, COVID) ARPGX2  GROUP A STREP BY PCR                                                                                                                          Radiology No results found.  Pertinent labs &  imaging results that were available during my care of the patient were reviewed by me and considered in my medical decision making (see MDM for details).  Medications Ordered in ED Medications - No data to display  Procedures Procedures  (including critical care time)  Medical Decision Making / ED Course   This patient presents to the ED for concern of sore throat, this involves an extensive number of treatment options, and is a complaint that carries with it a high risk of complications and morbidity.  The differential diagnosis includes viral pharyngitis, strep pharyngitis, COVID-19, RPA, PTA  MDM: Patient seen emergency room for evaluation of a sore throat.  Physical exam with mild oropharyngeal erythema but is otherwise unremarkable.  No tonsillar swelling or exudate.  COVID-negative, flu negative, strep negative.  I offered viscous lidocaine for comfort and patient declined.  Patient then discharged with outpatient follow-up.   Additional history obtained:  -External records from outside source obtained and reviewed including: Chart review including previous notes, labs, imaging, consultation notes   Lab Tests: -I ordered, reviewed, and interpreted labs.   The pertinent results include:   Labs Reviewed  RESP PANEL BY RT-PCR (FLU A&B, COVID) ARPGX2  GROUP A STREP BY PCR     Medicines ordered and prescription drug management: No orders of the defined types were placed in this encounter.   -I have reviewed the patients home medicines and have made adjustments as needed  Critical interventions none  Social Determinants of Health:  Factors impacting patients care include: none   Reevaluation: After the interventions noted above, I reevaluated the patient and found that they have :stayed the same  Co morbidities that complicate the patient  evaluation  Past Medical History:  Diagnosis Date   Anxiety    Brain tumor (benign) (HCC)    Chronic pain    Depression    Elevated cholesterol    Headache(784.0)    Hypertension    Numerous moles    Osteoporosis    Seizures ([place])    Trichimoniasis       Dispostion: I considered admission for this patient, but her sore throat does not meet inpatient criteria for admission she is safe for discharge with outpatient follow-up     Final Clinical Impression(s) / ED Diagnoses Final diagnoses:  Sore throat     '@PCDICTATION'$ @    Kooper Godshall, Debe Coder, MD 11/23/21 0430

## 2021-11-23 NOTE — ED Triage Notes (Signed)
Pt in via POV sore throat 8/10 pain since yesterday evening, has not taken anything for it, states her grandchildren have been sick recently.

## 2021-11-28 ENCOUNTER — Ambulatory Visit: Payer: Medicare HMO | Admitting: Adult Health

## 2021-11-28 DIAGNOSIS — F329 Major depressive disorder, single episode, unspecified: Secondary | ICD-10-CM | POA: Diagnosis not present

## 2021-11-28 DIAGNOSIS — I10 Essential (primary) hypertension: Secondary | ICD-10-CM | POA: Diagnosis not present

## 2021-11-28 DIAGNOSIS — G894 Chronic pain syndrome: Secondary | ICD-10-CM | POA: Diagnosis not present

## 2021-11-28 DIAGNOSIS — S0100XA Unspecified open wound of scalp, initial encounter: Secondary | ICD-10-CM | POA: Diagnosis not present

## 2021-11-28 DIAGNOSIS — Z6821 Body mass index (BMI) 21.0-21.9, adult: Secondary | ICD-10-CM | POA: Diagnosis not present

## 2021-11-28 DIAGNOSIS — D510 Vitamin B12 deficiency anemia due to intrinsic factor deficiency: Secondary | ICD-10-CM | POA: Diagnosis not present

## 2021-12-02 DIAGNOSIS — Q8500 Neurofibromatosis, unspecified: Secondary | ICD-10-CM | POA: Diagnosis not present

## 2021-12-02 DIAGNOSIS — I10 Essential (primary) hypertension: Secondary | ICD-10-CM | POA: Diagnosis not present

## 2021-12-02 DIAGNOSIS — K5903 Drug induced constipation: Secondary | ICD-10-CM | POA: Diagnosis not present

## 2021-12-02 DIAGNOSIS — M542 Cervicalgia: Secondary | ICD-10-CM | POA: Diagnosis not present

## 2021-12-02 DIAGNOSIS — G43701 Chronic migraine without aura, not intractable, with status migrainosus: Secondary | ICD-10-CM | POA: Diagnosis not present

## 2021-12-02 DIAGNOSIS — D329 Benign neoplasm of meninges, unspecified: Secondary | ICD-10-CM | POA: Diagnosis not present

## 2021-12-02 DIAGNOSIS — G4459 Other complicated headache syndrome: Secondary | ICD-10-CM | POA: Diagnosis not present

## 2021-12-02 DIAGNOSIS — M545 Low back pain, unspecified: Secondary | ICD-10-CM | POA: Diagnosis not present

## 2021-12-02 DIAGNOSIS — Z79891 Long term (current) use of opiate analgesic: Secondary | ICD-10-CM | POA: Diagnosis not present

## 2021-12-12 ENCOUNTER — Encounter: Payer: Self-pay | Admitting: Nutrition

## 2021-12-24 ENCOUNTER — Emergency Department (HOSPITAL_COMMUNITY): Payer: Medicare HMO

## 2021-12-24 ENCOUNTER — Encounter (HOSPITAL_COMMUNITY): Payer: Self-pay | Admitting: *Deleted

## 2021-12-24 ENCOUNTER — Other Ambulatory Visit: Payer: Self-pay

## 2021-12-24 ENCOUNTER — Emergency Department (HOSPITAL_COMMUNITY)
Admission: EM | Admit: 2021-12-24 | Discharge: 2021-12-24 | Disposition: A | Discharge status: Home / Self Care | Payer: Medicare HMO | Attending: Emergency Medicine | Admitting: Emergency Medicine

## 2021-12-24 DIAGNOSIS — R202 Paresthesia of skin: Secondary | ICD-10-CM | POA: Diagnosis not present

## 2021-12-24 DIAGNOSIS — R319 Hematuria, unspecified: Secondary | ICD-10-CM | POA: Diagnosis present

## 2021-12-24 DIAGNOSIS — R531 Weakness: Secondary | ICD-10-CM | POA: Insufficient documentation

## 2021-12-24 DIAGNOSIS — E876 Hypokalemia: Secondary | ICD-10-CM | POA: Diagnosis not present

## 2021-12-24 DIAGNOSIS — N858 Other specified noninflammatory disorders of uterus: Secondary | ICD-10-CM | POA: Diagnosis not present

## 2021-12-24 DIAGNOSIS — N3001 Acute cystitis with hematuria: Secondary | ICD-10-CM | POA: Insufficient documentation

## 2021-12-24 DIAGNOSIS — R109 Unspecified abdominal pain: Secondary | ICD-10-CM | POA: Diagnosis not present

## 2021-12-24 DIAGNOSIS — N281 Cyst of kidney, acquired: Secondary | ICD-10-CM | POA: Diagnosis not present

## 2021-12-24 DIAGNOSIS — K5730 Diverticulosis of large intestine without perforation or abscess without bleeding: Secondary | ICD-10-CM | POA: Diagnosis not present

## 2021-12-24 LAB — CBC
HCT: 34 % — ABNORMAL LOW (ref 36.0–46.0)
Hemoglobin: 12.2 g/dL (ref 12.0–15.0)
MCH: 30.7 pg (ref 26.0–34.0)
MCHC: 35.9 g/dL (ref 30.0–36.0)
MCV: 85.6 fL (ref 80.0–100.0)
Platelets: 180 10*3/uL (ref 150–400)
RBC: 3.97 MIL/uL (ref 3.87–5.11)
RDW: 12.5 % (ref 11.5–15.5)
WBC: 3.7 10*3/uL — ABNORMAL LOW (ref 4.0–10.5)
nRBC: 0 % (ref 0.0–0.2)

## 2021-12-24 LAB — URINALYSIS, ROUTINE W REFLEX MICROSCOPIC
Bacteria, UA: NONE SEEN
Bilirubin Urine: NEGATIVE
Glucose, UA: NEGATIVE mg/dL
Ketones, ur: 5 mg/dL — AB
Nitrite: NEGATIVE
Protein, ur: NEGATIVE mg/dL
Specific Gravity, Urine: 1.015 (ref 1.005–1.030)
pH: 7 (ref 5.0–8.0)

## 2021-12-24 LAB — BASIC METABOLIC PANEL
Anion gap: 10 (ref 5–15)
BUN: 11 mg/dL (ref 8–23)
CO2: 22 mmol/L (ref 22–32)
Calcium: 10.6 mg/dL — ABNORMAL HIGH (ref 8.9–10.3)
Chloride: 98 mmol/L (ref 98–111)
Creatinine, Ser: 0.71 mg/dL (ref 0.44–1.00)
GFR, Estimated: >60 mL/min (ref >60)
Glucose, Bld: 98 mg/dL (ref 70–99)
Potassium: 2.8 mmol/L — ABNORMAL LOW (ref 3.5–5.1)
Sodium: 130 mmol/L — ABNORMAL LOW (ref 135–145)

## 2021-12-24 LAB — CBG MONITORING, ED: Glucose-Capillary: 82 mg/dL (ref 70–99)

## 2021-12-24 MED ORDER — SODIUM CHLORIDE 0.9 % IV BOLUS
1000.0000 mL | Freq: Once | INTRAVENOUS | Status: AC
  Administered 2021-12-24: 1000 mL via INTRAVENOUS

## 2021-12-24 MED ORDER — POTASSIUM CHLORIDE 10 MEQ/100ML IV SOLN
10.0000 meq | Freq: Once | INTRAVENOUS | Status: AC
  Administered 2021-12-24: 10 meq via INTRAVENOUS
  Filled 2021-12-24: qty 100

## 2021-12-24 MED ORDER — POTASSIUM CHLORIDE 10 MEQ/100ML IV SOLN
INTRAVENOUS | Status: AC
  Administered 2021-12-24: 10 meq via INTRAVENOUS
  Filled 2021-12-24: qty 100

## 2021-12-24 MED ORDER — ALPRAZOLAM 0.5 MG PO TABS
1.0000 mg | ORAL_TABLET | Freq: Once | ORAL | Status: AC
  Administered 2021-12-24: 1 mg via ORAL
  Filled 2021-12-24: qty 2

## 2021-12-24 MED ORDER — POTASSIUM CHLORIDE 10 MEQ/100ML IV SOLN
10.0000 meq | Freq: Once | INTRAVENOUS | Status: AC
  Administered 2021-12-24: 10 meq via INTRAVENOUS

## 2021-12-24 MED ORDER — POTASSIUM CHLORIDE 10 MEQ/100ML IV SOLN
INTRAVENOUS | Status: AC
  Filled 2021-12-24: qty 100

## 2021-12-24 MED ORDER — PANTOPRAZOLE SODIUM 40 MG IV SOLR
40.0000 mg | Freq: Once | INTRAVENOUS | Status: AC
Start: 2021-12-24 — End: 2021-12-24
  Administered 2021-12-24: 40 mg via INTRAVENOUS
  Filled 2021-12-24: qty 10

## 2021-12-24 MED ORDER — SODIUM CHLORIDE 0.9 % IV SOLN
2.0000 g | Freq: Once | INTRAVENOUS | Status: AC
  Administered 2021-12-24: 2 g via INTRAVENOUS
  Filled 2021-12-24: qty 20

## 2021-12-24 MED ORDER — IOHEXOL 300 MG/ML  SOLN
100.0000 mL | Freq: Once | INTRAMUSCULAR | Status: AC | PRN
  Administered 2021-12-24: 80 mL via INTRAVENOUS

## 2021-12-24 MED ORDER — SULFAMETHOXAZOLE-TRIMETHOPRIM 800-160 MG PO TABS
1.0000 | ORAL_TABLET | Freq: Two times a day (BID) | ORAL | 0 refills | Status: AC
Start: 2021-12-24 — End: 2021-12-31

## 2021-12-24 NOTE — Discharge Instructions (Signed)
Follow-up with your family doctor next week for your low potassium and urinary tract infection.  Make sure you take double your amount of potassium for the next 5 days.  Then you can go back to taking your normal dose.  Follow-up with your gastroenterologist in the next couple weeks for this difficulty swallowing and losing weight

## 2021-12-24 NOTE — ED Notes (Signed)
Pt transferred to US. 

## 2021-12-24 NOTE — ED Provider Notes (Incomplete)
[facility] EMERGENCY DEPARTMENT Provider Note   CSN: [id number] Arrival date & time: 12/24/21  7322     History {Add pertinent medical, surgical, social history, OB history to HPI:1} Chief Complaint  Patient presents with   Numbness    Generalized onset 12/23/21    Julia [name] is a 61 y.o. female.  Patient states that she has been losing weight and not eating much lately.  She has difficulty eating.  She does not vomit much.  Patient has a history of GERD   Weakness      Home Medications Prior to Admission medications   Medication Sig Start Date End Date Taking? Authorizing Provider  sulfamethoxazole-trimethoprim (BACTRIM DS) 800-160 MG tablet Take 1 tablet by mouth 2 (two) times daily for 7 days. 12/24/21 12/31/21 Yes Milton Ferguson, MD  albuterol (PROVENTIL HFA;VENTOLIN HFA) 108 (90 Base) MCG/ACT inhaler Inhale 1-2 puffs into the lungs every 6 (six) hours as needed for shortness of breath. 09/18/17   [provider]  alendronate (FOSAMAX) 70 MG tablet Take 1 tablet (70 mg total) by mouth every 7 (seven) days. Take with a full glass of water on an empty stomach. 06/20/21   Cassandria Anger, MD  ALPRAZolam Duanne Moron) 1 MG tablet Take 1 mg by mouth 4 (four) times daily.    [provider]  dicyclomine (BENTYL) 20 MG tablet Take 1 tablet (20 mg total) by mouth 2 (two) times daily. 07/10/21   Sharyn Creamer, MD  ezetimibe (ZETIA) 10 MG tablet Take 10 mg by mouth daily.    [provider]  furosemide (LASIX) 20 MG tablet Take 20 mg by mouth daily.    [provider]  hydrochlorothiazide (MICROZIDE) 12.5 MG capsule Take 12.5 mg by mouth daily. 05/15/21   [provider]  losartan (COZAAR) 50 MG tablet Take 50 mg by mouth daily.  09/01/13   [provider]  methocarbamol (ROBAXIN) 500 MG tablet Take 1 tablet (500 mg total) by mouth 2 (two) times daily. 11/15/21   Prosperi, Christian H, PA-C  mirtazapine (REMERON SOL-TAB) 30 MG  disintegrating tablet Take 30 mg by mouth at bedtime.    [provider]  nystatin (MYCOSTATIN) 100000 UNIT/ML suspension Take 5 mLs (500,000 Units total) by mouth 4 (four) times daily. 09/26/21   Volney American, PA-C  oxyCODONE-acetaminophen (PERCOCET/ROXICET) 5-325 MG tablet Take 1 tablet by mouth every 4 (four) hours as needed for moderate pain. 04/27/21   [provider]  pantoprazole (PROTONIX) 40 MG tablet Take 40 mg by mouth 2 (two) times daily. 05/02/21   [provider]  potassium chloride SA (KLOR-CON) 20 MEQ tablet Take 20 mEq by mouth daily. 08/02/20   [provider]  pravastatin (PRAVACHOL) 40 MG tablet Take 40 mg by mouth daily. 06/03/21   [provider]  predniSONE (DELTASONE) 10 MG tablet 6, 5, 4, 3, 2 then 1 tablet by mouth daily for 6 days total. 07/09/21   Idol, Almyra Free, PA-C  promethazine (PHENERGAN) 25 MG suppository Place 1 suppository (25 mg total) rectally every 6 (six) hours as needed for nausea or vomiting. Patient not taking: Reported on 11/06/2021 12/11/20   Milton Ferguson, MD  promethazine (PHENERGAN) 25 MG tablet Take 1 tablet (25 mg total) by mouth every 6 (six) hours as needed for nausea or vomiting. 07/19/21   Fredia Sorrow, MD  propranolol (INDERAL) 20 MG tablet Take 20 mg by mouth daily. 05/31/21   [provider]      Allergies  Propranolol, Naprosyn [naproxen], and Penicillins    Review of Systems   Review of Systems  Neurological:  Positive for weakness.    Physical Exam Updated Vital Signs BP (!) 130/95   Pulse 100   Temp 97.9 F (36.6 C) (Oral)   Resp 15   LMP 09/06/2012   SpO2 95%  Physical Exam  ED Results / Procedures / Treatments   Labs (all labs ordered are listed, but only abnormal results are displayed) Labs Reviewed  BASIC METABOLIC PANEL - Abnormal; Notable for the following components:      Result Value   Sodium 130 (*)    Potassium 2.8 (*)    Calcium 10.6 (*)    All  other components within normal limits  CBC - Abnormal; Notable for the following components:   WBC 3.7 (*)    HCT 34.0 (*)    All other components within normal limits  URINALYSIS, ROUTINE W REFLEX MICROSCOPIC - Abnormal; Notable for the following components:   Hgb urine dipstick SMALL (*)    Ketones, ur 5 (*)    Leukocytes,Ua LARGE (*)    Non Squamous Epithelial 0-5 (*)    All other components within normal limits  URINE CULTURE  CBG MONITORING, ED    EKG None  Radiology CT ABDOMEN PELVIS W CONTRAST  Result Date: 12/24/2021 CLINICAL DATA:  Abdominal pain, acute, nonlocalized EXAM: CT ABDOMEN AND PELVIS WITH CONTRAST TECHNIQUE: Multidetector CT imaging of the abdomen and pelvis was performed using the standard protocol following bolus administration of intravenous contrast. RADIATION DOSE REDUCTION: This exam was performed according to the departmental dose-optimization program which includes automated exposure control, adjustment of the mA and/or kV according to patient size and/or use of iterative reconstruction technique. CONTRAST:  70m OMNIPAQUE IOHEXOL 300 MG/ML  SOLN COMPARISON:  CT 10/30/2021 FINDINGS: Lower chest: No acute abnormality. Hepatobiliary: There are few small hypodense liver lesions, likely cysts. The gallbladder is unremarkable. Pancreas: Unremarkable. No pancreatic ductal dilatation or surrounding inflammatory changes. 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 Adrenals/Urinary Tract: There is a heterogeneous 1.9 cm left adrenal nodule, stable since the most recent exam, has slightly enlarged since October 2005 when it originally measured up to 1.1 cm. This is likely an adenoma given slow interval enlargement. No hydronephrosis or nephroureterolithiasis. There are small bilateral renal cysts which require no follow-up imaging. The bladder is moderately distended. Stomach/Bowel: The  stomach is within normal limits. There is no evidence of bowel obstruction.The appendix is normal. Colonic diverticulosis, mostly affecting the descending colon. Vascular/Lymphatic: Aortoiliac atherosclerosis. No AAA. No lymphadenopathy. Reproductive: Trace physiologic fluid along the right adnexa and possible mild hydrosalpinx. Other: No abdominal wall hernia or abnormality. No free air. Musculoskeletal: Multilevel degenerative changes spine. No suspicious osseous lesion. No acute osseous abnormality. IMPRESSION: Possible mild right-sided hydrosalpinx. Normal appendix. Colonic diverticulosis without evidence of acute diverticulitis. Additional chronic findings as described above. Electronically Signed   By: JMaurine SimmeringM.D.   On: 12/24/2021 11:39    Procedures Procedures  {Document cardiac monitor, telemetry assessment procedure when appropriate:1}  Medications Ordered in ED Medications  potassium chloride 10 MEQ/100ML IVPB (has no administration in time range)  cefTRIAXone (ROCEPHIN) 2 g in sodium chloride 0.9 % 100 mL IVPB (has no administration in time range)  potassium chloride 10 MEQ/100ML IVPB (has no administration in time range)  sodium chloride 0.9 % bolus 1,000 mL (1,000 mLs Intravenous New Bag/Given 12/24/21 0955)  pantoprazole (PROTONIX)  injection 40 mg (40 mg Intravenous Given 12/24/21 0953)  potassium chloride 10 mEq in 100 mL IVPB (0 mEq Intravenous Stopped 12/24/21 1332)  potassium chloride 10 mEq in 100 mL IVPB (0 mEq Intravenous Stopped 12/24/21 1111)  iohexol (OMNIPAQUE) 300 MG/ML solution 100 mL (80 mLs Intravenous Contrast Given 12/24/21 1044)    ED Course/ Medical Decision Making/ A&P                           Medical Decision Making Amount and/or Complexity of Data Reviewed Labs: ordered. Radiology: ordered.  Risk Prescription drug management.   Patient with a urinary tract infection and significant weight loss.  CT scans unremarkable except for possible mild  right sided hydrosalpinx.  Patient refused ultrasound.  She will be placed on antibiotics for urinary tract infection and will follow-up with her md and gi md  {Document critical care time when appropriate:1} {Document review of labs and clinical decision tools ie heart score, Chads2Vasc2 etc:1}  {Document your independent review of radiology images, and any outside records:1} {Document your discussion with family members, caretakers, and with consultants:1} {Document social determinants of health affecting pt's care:1} {Document your decision making why or why not admission, treatments were needed:1} Final Clinical Impression(s) / ED Diagnoses Final diagnoses:  Hypokalemia  Acute cystitis with hematuria    Rx / DC Orders ED Discharge Orders          Ordered    sulfamethoxazole-trimethoprim (BACTRIM DS) 800-160 MG tablet  2 times daily        12/24/21 1341

## 2021-12-24 NOTE — ED Triage Notes (Signed)
Pt c/o generalized body weakness, pt reports onset x 1 day, denies n/v/d, denies CP & SOB, reports generalized body numbness, pt c/o wt loss over the last several weeks, unable to state amt of wt. Loss, A&O X4

## 2021-12-24 NOTE — ED Notes (Signed)
The pts daughters are at bedside, provided an update, pt to receive regular scheduled dose of Xanax '1mg'$  per pt and family request to have [place], pt agrees for [place], pt and family aware of Craig, [place] notified

## 2021-12-26 DIAGNOSIS — R69 Illness, unspecified: Secondary | ICD-10-CM | POA: Diagnosis not present

## 2021-12-26 DIAGNOSIS — R5381 Other malaise: Secondary | ICD-10-CM | POA: Diagnosis not present

## 2021-12-26 LAB — URINE CULTURE: Culture: 10000 — AB

## 2021-12-27 ENCOUNTER — Telehealth (HOSPITAL_BASED_OUTPATIENT_CLINIC_OR_DEPARTMENT_OTHER): Payer: Self-pay | Admitting: Emergency Medicine

## 2021-12-27 NOTE — Telephone Encounter (Signed)
Post ED Visit - Positive Culture Follow-up  Culture report reviewed by antimicrobial stewardship pharmacist: [facility] Team '[]'$  Elenor Quinones, Pharm.D. '[]'$  Heide Guile, Pharm.D., BCPS AQ-ID '[]'$  Parks Neptune, Pharm.D., BCPS '[]'$  Alycia Rossetti, Pharm.D., BCPS '[]'$  Grant, Pharm.D., BCPS, AAHIVP '[]'$  Legrand Como, Pharm.D., BCPS, AAHIVP '[]'$  Salome Arnt, PharmD, BCPS '[]'$  Johnnette Gourd, PharmD, BCPS '[]'$  Hughes Better, PharmD, BCPS '[]'$  Leeroy Cha, PharmD '[]'$  Laqueta Linden, PharmD, BCPS '[]'$  Albertina Parr, PharmD Erskine Speed PharmD  [facility] Team '[]'$  Leodis Sias, PharmD '[]'$  Lindell Spar, PharmD '[]'$  Royetta Asal, PharmD '[]'$  Graylin Shiver, Rph '[]'$  Rema Fendt) Glennon Mac, PharmD '[]'$  Arlyn Dunning, PharmD '[]'$  Netta Cedars, PharmD '[]'$  Dia Sitter, PharmD '[]'$  Leone Haven, PharmD '[]'$  Gretta Arab, PharmD '[]'$  Theodis Shove, PharmD '[]'$  Peggyann Juba, PharmD '[]'$  Reuel Boom, PharmD   Positive urine culture Treated with sulfamethoxazole-trimethoprim, organism sensitive to the same and no further patient follow-up is required at this time.  Hazle Nordmann 12/27/2021, 9:37 AM

## 2021-12-28 ENCOUNTER — Emergency Department (HOSPITAL_COMMUNITY)
Admission: EM | Admit: 2021-12-28 | Discharge: 2021-12-28 | Disposition: A | Discharge status: Home / Self Care | Payer: Medicare HMO | Attending: Emergency Medicine | Admitting: Emergency Medicine

## 2021-12-28 ENCOUNTER — Emergency Department (HOSPITAL_COMMUNITY): Payer: Medicare HMO

## 2021-12-28 ENCOUNTER — Other Ambulatory Visit: Payer: Self-pay

## 2021-12-28 ENCOUNTER — Encounter (HOSPITAL_COMMUNITY): Payer: Self-pay

## 2021-12-28 DIAGNOSIS — R110 Nausea: Secondary | ICD-10-CM | POA: Diagnosis not present

## 2021-12-28 DIAGNOSIS — R1030 Lower abdominal pain, unspecified: Secondary | ICD-10-CM | POA: Insufficient documentation

## 2021-12-28 DIAGNOSIS — R109 Unspecified abdominal pain: Secondary | ICD-10-CM | POA: Diagnosis not present

## 2021-12-28 DIAGNOSIS — R531 Weakness: Secondary | ICD-10-CM | POA: Insufficient documentation

## 2021-12-28 DIAGNOSIS — I700 Atherosclerosis of aorta: Secondary | ICD-10-CM | POA: Diagnosis not present

## 2021-12-28 DIAGNOSIS — R5381 Other malaise: Secondary | ICD-10-CM | POA: Diagnosis not present

## 2021-12-28 DIAGNOSIS — R1032 Left lower quadrant pain: Secondary | ICD-10-CM | POA: Diagnosis not present

## 2021-12-28 LAB — CBC
HCT: 37.6 % (ref 36.0–46.0)
Hemoglobin: 13.3 g/dL (ref 12.0–15.0)
MCH: 30.7 pg (ref 26.0–34.0)
MCHC: 35.4 g/dL (ref 30.0–36.0)
MCV: 86.8 fL (ref 80.0–100.0)
Platelets: 226 10*3/uL (ref 150–400)
RBC: 4.33 MIL/uL (ref 3.87–5.11)
RDW: 12.9 % (ref 11.5–15.5)
WBC: 4.4 10*3/uL (ref 4.0–10.5)
nRBC: 0 % (ref 0.0–0.2)

## 2021-12-28 LAB — COMPREHENSIVE METABOLIC PANEL
ALT: 25 U/L (ref 0–44)
AST: 25 U/L (ref 15–41)
Albumin: 4.6 g/dL (ref 3.5–5.0)
Alkaline Phosphatase: 52 U/L (ref 38–126)
Anion gap: 9 (ref 5–15)
BUN: 14 mg/dL (ref 8–23)
CO2: 21 mmol/L — ABNORMAL LOW (ref 22–32)
Calcium: 11.7 mg/dL — ABNORMAL HIGH (ref 8.9–10.3)
Chloride: 101 mmol/L (ref 98–111)
Creatinine, Ser: 1.03 mg/dL — ABNORMAL HIGH (ref 0.44–1.00)
GFR, Estimated: >60 mL/min (ref >60)
Glucose, Bld: 123 mg/dL — ABNORMAL HIGH (ref 70–99)
Potassium: 4.5 mmol/L (ref 3.5–5.1)
Sodium: 131 mmol/L — ABNORMAL LOW (ref 135–145)
Total Bilirubin: 0.8 mg/dL (ref 0.3–1.2)
Total Protein: 7.3 g/dL (ref 6.5–8.1)

## 2021-12-28 LAB — DIFFERENTIAL
Abs Immature Granulocytes: 0.01 10*3/uL (ref 0.00–0.07)
Basophils Absolute: 0 10*3/uL (ref 0.0–0.1)
Basophils Relative: 1 %
Eosinophils Absolute: 0 10*3/uL (ref 0.0–0.5)
Eosinophils Relative: 0 %
Immature Granulocytes: 0 %
Lymphocytes Relative: 26 %
Lymphs Abs: 1.2 10*3/uL (ref 0.7–4.0)
Monocytes Absolute: 0.3 10*3/uL (ref 0.1–1.0)
Monocytes Relative: 6 %
Neutro Abs: 3 10*3/uL (ref 1.7–7.7)
Neutrophils Relative %: 67 %

## 2021-12-28 LAB — URINALYSIS, ROUTINE W REFLEX MICROSCOPIC
Bilirubin Urine: NEGATIVE
Glucose, UA: NEGATIVE mg/dL
Hgb urine dipstick: NEGATIVE
Ketones, ur: 20 mg/dL — AB
Leukocytes,Ua: NEGATIVE
Nitrite: NEGATIVE
Protein, ur: NEGATIVE mg/dL
Specific Gravity, Urine: 1.032 — ABNORMAL HIGH (ref 1.005–1.030)
pH: 7 (ref 5.0–8.0)

## 2021-12-28 LAB — LIPASE, BLOOD: Lipase: 34 U/L (ref 11–51)

## 2021-12-28 MED ORDER — ONDANSETRON HCL 4 MG/2ML IJ SOLN
4.0000 mg | Freq: Once | INTRAMUSCULAR | Status: AC
  Administered 2021-12-28: 4 mg via INTRAVENOUS
  Filled 2021-12-28: qty 2

## 2021-12-28 MED ORDER — ONDANSETRON HCL 4 MG/2ML IJ SOLN
4.0000 mg | Freq: Once | INTRAMUSCULAR | Status: DC
  Filled 2021-12-28: qty 2

## 2021-12-28 MED ORDER — IOHEXOL 300 MG/ML  SOLN
100.0000 mL | Freq: Once | INTRAMUSCULAR | Status: AC | PRN
  Administered 2021-12-28: 100 mL via INTRAVENOUS

## 2021-12-28 MED ORDER — SODIUM CHLORIDE 0.9 % IV BOLUS: 1000.0000 mL | Freq: Once | INTRAVENOUS | Status: DC

## 2021-12-28 MED ORDER — OXYCODONE-ACETAMINOPHEN 5-325 MG PO TABS: 1.0000 | ORAL_TABLET | Freq: Four times a day (QID) | ORAL | 0 refills | Status: DC | PRN

## 2021-12-28 MED ORDER — SODIUM CHLORIDE 0.9 % IV BOLUS
500.0000 mL | Freq: Once | INTRAVENOUS | Status: AC
  Administered 2021-12-28: 500 mL via INTRAVENOUS

## 2021-12-28 MED ORDER — HYDROMORPHONE HCL 1 MG/ML IJ SOLN
0.5000 mg | Freq: Once | INTRAMUSCULAR | Status: DC
  Filled 2021-12-28: qty 0.5

## 2021-12-28 NOTE — ED Triage Notes (Signed)
Patient arrived via EMS with complaints of weakness and left lower quadrant abdominal pain since 1:00am today. Patient reports N/V since this AM.

## 2021-12-28 NOTE — ED Notes (Signed)
Patient transported to CT 

## 2021-12-28 NOTE — ED Notes (Signed)
Pt d/c home per MD order. Discharge summary reviewed with pt, pt verbalizes understanding. This RN contacted pt daughter identified on the chart- Charlene who reports she is pt discharge ride home. No s/s of acute distress noted at discharge.

## 2021-12-28 NOTE — ED Provider Notes (Signed)
[facility] EMERGENCY DEPARTMENT Provider Note   CSN: [id number] Arrival date & time: 12/28/21  1007     History {Add pertinent medical, surgical, social history, OB history to HPI:1} Chief Complaint  Patient presents with   Weakness   Abdominal Pain    Julia [name] is a 61 y.o. female.  Patient with a history of chronic abdominal pain and uterine fibroids   Weakness Associated symptoms: abdominal pain   Abdominal Pain      Home Medications Prior to Admission medications   Medication Sig Start Date End Date Taking? Authorizing Provider  oxyCODONE-acetaminophen (PERCOCET/ROXICET) 5-325 MG tablet Take 1 tablet by mouth every 6 (six) hours as needed for severe pain. 12/28/21  Yes Milton Ferguson, MD  albuterol (PROVENTIL HFA;VENTOLIN HFA) 108 (90 Base) MCG/ACT inhaler Inhale 1-2 puffs into the lungs every 6 (six) hours as needed for shortness of breath. 09/18/17   [provider]  alendronate (FOSAMAX) 70 MG tablet Take 1 tablet (70 mg total) by mouth every 7 (seven) days. Take with a full glass of water on an empty stomach. 06/20/21   Cassandria Anger, MD  ALPRAZolam Duanne Moron) 1 MG tablet Take 1 mg by mouth 4 (four) times daily.    [provider]  dicyclomine (BENTYL) 20 MG tablet Take 1 tablet (20 mg total) by mouth 2 (two) times daily. 07/10/21   Sharyn Creamer, MD  ezetimibe (ZETIA) 10 MG tablet Take 10 mg by mouth daily.    [provider]  furosemide (LASIX) 20 MG tablet Take 20 mg by mouth daily.    [provider]  hydrochlorothiazide (MICROZIDE) 12.5 MG capsule Take 12.5 mg by mouth daily. 05/15/21   [provider]  losartan (COZAAR) 50 MG tablet Take 50 mg by mouth daily.  09/01/13   [provider]  methocarbamol (ROBAXIN) 500 MG tablet Take 1 tablet (500 mg total) by mouth 2 (two) times daily. 11/15/21   Prosperi, Christian H, PA-C  mirtazapine (REMERON SOL-TAB) 30 MG disintegrating tablet Take 30 mg by mouth at  bedtime.    [provider]  nystatin (MYCOSTATIN) 100000 UNIT/ML suspension Take 5 mLs (500,000 Units total) by mouth 4 (four) times daily. 09/26/21   Volney American, PA-C  pantoprazole (PROTONIX) 40 MG tablet Take 40 mg by mouth 2 (two) times daily. 05/02/21   [provider]  potassium chloride SA (KLOR-CON) 20 MEQ tablet Take 20 mEq by mouth daily. 08/02/20   [provider]  pravastatin (PRAVACHOL) 40 MG tablet Take 40 mg by mouth daily. 06/03/21   [provider]  predniSONE (DELTASONE) 10 MG tablet 6, 5, 4, 3, 2 then 1 tablet by mouth daily for 6 days total. 07/09/21   Idol, Almyra Free, PA-C  promethazine (PHENERGAN) 25 MG suppository Place 1 suppository (25 mg total) rectally every 6 (six) hours as needed for nausea or vomiting. Patient not taking: Reported on 11/06/2021 12/11/20   Milton Ferguson, MD  promethazine (PHENERGAN) 25 MG tablet Take 1 tablet (25 mg total) by mouth every 6 (six) hours as needed for nausea or vomiting. 07/19/21   Fredia Sorrow, MD  propranolol (INDERAL) 20 MG tablet Take 20 mg by mouth daily. 05/31/21   [provider]  sulfamethoxazole-trimethoprim (BACTRIM DS) 800-160 MG tablet Take 1 tablet by mouth 2 (two) times daily for 7 days. 12/24/21 12/31/21  Milton Ferguson, MD      Allergies    Propranolol, Naprosyn [naproxen], and Penicillins    Review of Systems  Review of Systems  Gastrointestinal:  Positive for abdominal pain.  Neurological:  Positive for weakness.    Physical Exam Updated Vital Signs BP (!) 152/133 (BP Location: Left Arm)   Pulse 86   Temp 98 F (36.7 C) (Oral)   Resp 16   Ht '5\' 1"'$  (1.549 m)   Wt 48 kg   LMP 09/06/2012   SpO2 99%   BMI 19.99 kg/m  Physical Exam  ED Results / Procedures / Treatments   Labs (all labs ordered are listed, but only abnormal results are displayed) Labs Reviewed  COMPREHENSIVE METABOLIC PANEL - Abnormal; Notable for the following components:      Result  Value   Sodium 131 (*)    CO2 21 (*)    Glucose, Bld 123 (*)    Creatinine, Ser 1.03 (*)    Calcium 11.7 (*)    All other components within normal limits  URINALYSIS, ROUTINE W REFLEX MICROSCOPIC - Abnormal; Notable for the following components:   Specific Gravity, Urine 1.032 (*)    Ketones, ur 20 (*)    All other components within normal limits  LIPASE, BLOOD  CBC  DIFFERENTIAL    EKG None  Radiology CT ABDOMEN PELVIS W CONTRAST  Result Date: 12/28/2021 CLINICAL DATA:  61 year old female with acute abdominal and pelvic pain. EXAM: CT ABDOMEN AND PELVIS WITH CONTRAST TECHNIQUE: Multidetector CT imaging of the abdomen and pelvis was performed using the standard protocol following bolus administration of intravenous contrast. RADIATION DOSE REDUCTION: This exam was performed according to the departmental dose-optimization program which includes automated exposure control, adjustment of the mA and/or kV according to patient size and/or use of iterative reconstruction technique. CONTRAST:  146m OMNIPAQUE IOHEXOL 300 MG/ML  SOLN COMPARISON:  12/24/2021 CT and prior studies FINDINGS: Lower chest: No acute abnormality. Hepatobiliary: No significant liver or gallbladder abnormalities are noted. There is no evidence of intrahepatic or extrahepatic biliary dilatation. Pancreas: Unremarkable Spleen: Small hypodense lesions within the spleen are unchanged from prior studies Adrenals/Urinary Tract: No significant renal abnormalities are noted. There is no evidence of hydronephrosis or urinary calculi. The adrenal glands and bladder are unremarkable except for a stable 1.5 cm LEFT adrenal adenoma. Stomach/Bowel: Stomach is within normal limits. Appendix appears normal. No evidence of bowel wall thickening, distention, or inflammatory changes. Colonic diverticulosis identified without evidence of acute diverticulitis. Vascular/Lymphatic: Aortic atherosclerosis. No enlarged abdominal or pelvic lymph nodes.  Reproductive: Uterus and bilateral adnexa are unremarkable. Other: No ascites, focal collection or pneumoperitoneum. Musculoskeletal: No acute or suspicious bony abnormalities are noted. Moderate degenerative disc disease at L4-5 and L5-S1 again noted. IMPRESSION: 1. No evidence of acute abnormality. 2. Colonic diverticulosis without evidence of acute diverticulitis. 3. LEFT adrenal adenoma. 4.  Aortic Atherosclerosis (ICD10-I70.0). Electronically Signed   By: JMargarette CanadaM.D.   On: 12/28/2021 13:01    Procedures Procedures  {Document cardiac monitor, telemetry assessment procedure when appropriate:1}  Medications Ordered in ED Medications  HYDROmorphone (DILAUDID) injection 0.5 mg (0.5 mg Intravenous Not Given 12/28/21 1050)  sodium chloride 0.9 % bolus 1,000 mL (1,000 mLs Intravenous Patient Refused/Not Given 12/28/21 1228)  ondansetron (ZOFRAN) injection 4 mg (4 mg Intravenous Patient Refused/Not Given 12/28/21 1223)  sodium chloride 0.9 % bolus 500 mL (0 mLs Intravenous Stopped 12/28/21 1342)  ondansetron (ZOFRAN) injection 4 mg (4 mg Intravenous Given 12/28/21 1042)  iohexol (OMNIPAQUE) 300 MG/ML solution 100 mL (100 mLs Intravenous Contrast Given 12/28/21 1243)    ED Course/ Medical Decision Making/ A&P  Medical Decision Making Amount and/or Complexity of Data Reviewed Radiology: ordered.  Risk Prescription drug management.   Patient with uterine fibroids diagnosed last visit.  She is given pain meds and will follow-up with GYN  {Document critical care time when appropriate:1} {Document review of labs and clinical decision tools ie heart score, Chads2Vasc2 etc:1}  {Document your independent review of radiology images, and any outside records:1} {Document your discussion with family members, caretakers, and with consultants:1} {Document social determinants of health affecting pt's care:1} {Document your decision making why or why not admission,  treatments were needed:1} Final Clinical Impression(s) / ED Diagnoses Final diagnoses:  Lower abdominal pain    Rx / DC Orders ED Discharge Orders          Ordered    oxyCODONE-acetaminophen (PERCOCET/ROXICET) 5-325 MG tablet  Every 6 hours PRN        12/28/21 1436

## 2021-12-28 NOTE — Discharge Instructions (Signed)
Follow-up with Dr.Eure in the next couple weeks for your uterine fibroids or you can follow-up with another OB/GYN doctor

## 2022-01-02 ENCOUNTER — Ambulatory Visit: Payer: Medicare HMO | Admitting: Nutrition

## 2022-01-02 ENCOUNTER — Ambulatory Visit: Payer: Medicare HMO | Admitting: "Endocrinology

## 2022-01-03 DIAGNOSIS — E213 Hyperparathyroidism, unspecified: Secondary | ICD-10-CM | POA: Diagnosis not present

## 2022-01-03 DIAGNOSIS — N390 Urinary tract infection, site not specified: Secondary | ICD-10-CM | POA: Diagnosis not present

## 2022-01-03 DIAGNOSIS — Q8500 Neurofibromatosis, unspecified: Secondary | ICD-10-CM | POA: Diagnosis not present

## 2022-01-03 DIAGNOSIS — Z6820 Body mass index (BMI) 20.0-20.9, adult: Secondary | ICD-10-CM | POA: Diagnosis not present

## 2022-01-03 DIAGNOSIS — I10 Essential (primary) hypertension: Secondary | ICD-10-CM | POA: Diagnosis not present

## 2022-01-03 DIAGNOSIS — F0390 Unspecified dementia without behavioral disturbance: Secondary | ICD-10-CM | POA: Diagnosis not present

## 2022-01-03 DIAGNOSIS — D510 Vitamin B12 deficiency anemia due to intrinsic factor deficiency: Secondary | ICD-10-CM | POA: Diagnosis not present

## 2022-01-03 DIAGNOSIS — G894 Chronic pain syndrome: Secondary | ICD-10-CM | POA: Diagnosis not present

## 2022-01-27 DIAGNOSIS — D510 Vitamin B12 deficiency anemia due to intrinsic factor deficiency: Secondary | ICD-10-CM | POA: Diagnosis not present

## 2022-01-27 DIAGNOSIS — E871 Hypo-osmolality and hyponatremia: Secondary | ICD-10-CM | POA: Diagnosis not present

## 2022-01-29 ENCOUNTER — Other Ambulatory Visit: Payer: Self-pay

## 2022-02-05 LAB — PTH, INTACT AND CALCIUM
Calcium: 12 mg/dL — ABNORMAL HIGH (ref 8.7–10.3)
PTH: 98 pg/mL — ABNORMAL HIGH (ref 15–65)

## 2022-02-07 LAB — CREATININE, URINE, 24 HOUR
Creatinine, 24H Ur: 213 mg/24 hr — ABNORMAL LOW (ref 800–1800)
Creatinine, Urine: 42.5 mg/dL

## 2022-02-08 LAB — CALCIUM, URINE, 24 HOUR
Calcium, 24H Urine: 69 mg/24 hr (ref 0–320)
Calcium, Urine: 13.8 mg/dL

## 2022-02-11 DIAGNOSIS — Z681 Body mass index (BMI) 19 or less, adult: Secondary | ICD-10-CM | POA: Diagnosis not present

## 2022-02-11 DIAGNOSIS — N390 Urinary tract infection, site not specified: Secondary | ICD-10-CM | POA: Diagnosis not present

## 2022-02-13 ENCOUNTER — Encounter: Payer: Self-pay | Admitting: "Endocrinology

## 2022-02-13 ENCOUNTER — Ambulatory Visit (INDEPENDENT_AMBULATORY_CARE_PROVIDER_SITE_OTHER): Payer: Medicare HMO | Admitting: "Endocrinology

## 2022-02-13 DIAGNOSIS — E212 Other hyperparathyroidism: Secondary | ICD-10-CM | POA: Diagnosis not present

## 2022-02-13 DIAGNOSIS — M816 Localized osteoporosis [Lequesne]: Secondary | ICD-10-CM | POA: Diagnosis not present

## 2022-02-13 MED ORDER — CINACALCET HCL 30 MG PO TABS: 30.0000 mg | ORAL_TABLET | Freq: Two times a day (BID) | ORAL | 3 refills | Status: DC

## 2022-02-13 NOTE — Progress Notes (Signed)
02/13/2022, 5:30 PM   Endocrinology follow-up note  Julia [name] is a 60 y.o.-year-old female, referred by her  Redmond School, MD  , for evaluation for hypercalcemia/hyperparathyroidism.  She is returning to discuss her recent labs and bone density. Past Medical History:  Diagnosis Date   Anxiety    Brain tumor (benign) (HCC)    Chronic pain    Depression    Elevated cholesterol    Headache(784.0)    Hypertension    Numerous moles    Osteoporosis    Seizures ([place])    Trichimoniasis     Past Surgical History:  Procedure Laterality Date   EXCISION OF SKIN TAG N/A 09/22/2017   Procedure: EXCISION OF SKIN TAGS ON FACE AND NECK (Procedure #2);  Surgeon: Jonnie Kind, MD;  Location: [facility];  Service: Gynecology;  Laterality: N/A;   HYSTEROSCOPY WITH D & C N/A 09/22/2017   Procedure: DILATATION AND CURETTAGE /HYSTEROSCOPY; EXCISION OF VULVAR AND RIGHT AND LEFT THIGH SKIN TAGS (Procedure #1);  Surgeon: Jonnie Kind, MD;  Location: [facility];  Service: Gynecology;  Laterality: N/A;   POLYPECTOMY N/A 09/22/2017   Procedure: REMOVAL OF ENDOMETRIAL POLYP (Procedure #1);  Surgeon: Jonnie Kind, MD;  Location: [facility];  Service: Gynecology;  Laterality: N/A;   TUBAL LIGATION      Social History   Tobacco Use   Smoking status: Never   Smokeless tobacco: Never  Vaping Use   Vaping Use: Never used  Substance Use Topics   Alcohol use: No   Drug use: No    Family History  Problem Relation Age of Onset   Cancer Father        lung   Stroke Mother    Heart failure Mother    Cancer Brother    Diabetes Brother     Outpatient Encounter Medications as of 02/13/2022  Medication Sig   cinacalcet (SENSIPAR) 30 MG tablet Take 1 tablet (30 mg total) by mouth 2 (two) times daily with a meal.   ciprofloxacin (CIPRO) 500 MG tablet Take 500 mg by mouth 2 (two) times daily.   donepezil (ARICEPT) 5 MG tablet Take 5 mg by  mouth daily.   albuterol (PROVENTIL HFA;VENTOLIN HFA) 108 (90 Base) MCG/ACT inhaler Inhale 1-2 puffs into the lungs every 6 (six) hours as needed for shortness of breath.   alendronate (FOSAMAX) 70 MG tablet Take 1 tablet (70 mg total) by mouth every 7 (seven) days. Take with a full glass of water on an empty stomach.   ALPRAZolam (XANAX) 1 MG tablet Take 1 mg by mouth 4 (four) times daily.   dicyclomine (BENTYL) 20 MG tablet Take 1 tablet (20 mg total) by mouth 2 (two) times daily.   ezetimibe (ZETIA) 10 MG tablet Take 10 mg by mouth daily.   furosemide (LASIX) 20 MG tablet Take 20 mg by mouth daily.   losartan (COZAAR) 50 MG tablet Take 50 mg by mouth daily.    methocarbamol (ROBAXIN) 500 MG tablet Take 1 tablet (500 mg total) by mouth 2 (two) times daily.   mirtazapine (REMERON SOL-TAB) 30 MG disintegrating tablet Take 30 mg by mouth at bedtime.   nystatin (MYCOSTATIN)  100000 UNIT/ML suspension Take 5 mLs (500,000 Units total) by mouth 4 (four) times daily.   oxyCODONE-acetaminophen (PERCOCET/ROXICET) 5-325 MG tablet Take 1 tablet by mouth every 6 (six) hours as needed for severe pain.   pantoprazole (PROTONIX) 40 MG tablet Take 40 mg by mouth 2 (two) times daily.   potassium chloride SA (KLOR-CON) 20 MEQ tablet Take 20 mEq by mouth daily.   pravastatin (PRAVACHOL) 40 MG tablet Take 40 mg by mouth daily.   predniSONE (DELTASONE) 10 MG tablet 6, 5, 4, 3, 2 then 1 tablet by mouth daily for 6 days total.   promethazine (PHENERGAN) 25 MG suppository Place 1 suppository (25 mg total) rectally every 6 (six) hours as needed for nausea or vomiting. (Patient not taking: Reported on 11/06/2021)   promethazine (PHENERGAN) 25 MG tablet Take 1 tablet (25 mg total) by mouth every 6 (six) hours as needed for nausea or vomiting.   propranolol (INDERAL) 20 MG tablet Take 20 mg by mouth daily.   [DISCONTINUED] hydrochlorothiazide (MICROZIDE) 12.5 MG capsule Take 12.5 mg by mouth daily.   No  facility-administered encounter medications on file as of 02/13/2022.    Allergies  Allergen Reactions   Propranolol    Naprosyn [Naproxen] Nausea Only   Penicillins Nausea And Vomiting and Other (See Comments)    Has patient had a PCN reaction causing immediate rash, facial/tongue/throat swelling, SOB or lightheadedness with hypotension: No Has patient had a PCN reaction causing severe rash involving mucus membranes or skin necrosis: No Has patient had a PCN reaction that required hospitalization Yes Has patient had a PCN reaction occurring within the last 10 years: No If all of the above answers are "NO", then may proceed with Cephalosporin use.      HPI  Julia [name] was diagnosed with hypercalcemia in April 2022.  Patient has no previously known history of parathyroid, pituitary, adrenal dysfunctions; no family history of such dysfunctions. She was on observation for hypercalcemia.  Her previsit labs show increasing calcium at 12 and PTH at 98.  She also has osteoporosis currently on alendronate 70 mg p.o. weekly.  She denies interval falls nor fractures.  Her repeat 24-hour urine also was not adequate in amount, did not show hypercalciuria.   -Her recent bone density shows osteoporosis on upper extremities.  Osteopenia on the spine and hips. No prior history of fragility fractures or falls. No history of  kidney stones. She complains of abdominal pain, GERD. No history of CKD.  she is not on HCTZ or other thiazide therapy.  No history of  vitamin D deficiency.   she is not on calcium supplements.  Her consumption of green leafy vegetables is suboptimal.  she does not have a family history of hypercalcemia, pituitary tumors, thyroid cancer, or osteoporosis.   I reviewed her chart and she also has a history of hyperlipidemia, hypertension on treatment..    ROS:  Constitutional: no weight gain/loss, no fatigue, no subjective hyperthermia, no subjective hypothermia   PE: BP  100/68   Pulse 68   Ht '5\' 1"'$  (1.549 m)   Wt 90 lb 3.2 oz (40.9 kg)   LMP 09/06/2012   BMI 17.04 kg/m , Body mass index is 17.04 kg/m. Wt Readings from Last 3 Encounters:  02/13/22 90 lb 3.2 oz (40.9 kg)  12/28/21 105 lb 13.1 oz (48 kg)  11/23/21 120 lb (54.4 kg)    Constitutional: + BMI 24.7 , + dysphoric and temperament, not in acute distress, normal state of  mind Eyes: PERRLA, EOMI, no exophthalmos ENT: moist mucous membranes, no gross thyromegaly, no gross cervical lymphadenopathy    CMP ( most recent) CMP     Component Value Date/Time   NA 131 (L) 12/28/2021 1029   K 4.5 12/28/2021 1029   CL 101 12/28/2021 1029   CO2 21 (L) 12/28/2021 1029   GLUCOSE 123 (H) 12/28/2021 1029   BUN 14 12/28/2021 1029   CREATININE 1.03 (H) 12/28/2021 1029   CALCIUM 12.0 (H) 02/03/2022 1206   PROT 7.3 12/28/2021 1029   ALBUMIN 4.6 12/28/2021 1029   AST 25 12/28/2021 1029   ALT 25 12/28/2021 1029   ALKPHOS 52 12/28/2021 1029   BILITOT 0.8 12/28/2021 1029   GFRNONAA >60 12/28/2021 1029   GFRAA >60 09/17/2017 1323      Assessment: 1. Hypercalcemia / Hyperparathyroidism 2.  Osteoporosis  Plan: Patient has had several instances of elevated calcium, with the highest level being at 12 mg per DL, with the corresponding PTH of 98.   She does have history of vitamin D deficiency currently on vitamin D supplement. This presentation is still consistent with primary hyperparathyroidism.  PTH related peptide was undetectable during the previous measurement. Possible complication seems to be osteoporosis specifically of upper extremities.  She also has osteopenia on the spine as well as hips.  She is tolerating and advised to continue alendronate 70 mg p.o. weekly.    She is not a suitable surgical candidate.  Patient wishes to avoid it.  In the interest of controlling hypercalcemia, I discussed initiated Sensipar 30 mg p.o. twice daily with plan to measure her calcium in 3-74-month  Her 24-hour  urine sample was inadequate, hence 24-hour urine calcium was inconclusive.   She is advised to maintain close follow-up with her PMD.   I spent 21 minutes in the care of the patient today including review of labs from Thyroid Function, CMP, and other relevant labs ; imaging/biopsy records (current and previous including abstractions from other facilities); face-to-face time discussing  her lab results and symptoms, medications doses, her options of short and long term treatment based on the latest standards of care / guidelines;   and documenting the encounter.  DFarris Has participated in the discussions, expressed understanding, and voiced agreement with the above plans.  All questions were answered to her satisfaction. she is encouraged to contact clinic should she have any questions or concerns prior to her return visit.    - Return for F/U with Pre-visit Labs.   GGlade Lloyd MD CThe [facility] [address]Phone: [phone] Fax: [phone]   This note was partially dictated with voice recognition software. Similar sounding words can be transcribed inadequately or may not  be corrected upon review.  02/13/2022, 5:30 PM

## 2022-02-17 ENCOUNTER — Ambulatory Visit (INDEPENDENT_AMBULATORY_CARE_PROVIDER_SITE_OTHER): Payer: Medicare HMO | Admitting: Diagnostic Neuroimaging

## 2022-02-17 ENCOUNTER — Encounter: Payer: Self-pay | Admitting: Diagnostic Neuroimaging

## 2022-02-17 VITALS — BP 108/71 | HR 71 | Ht 60.0 in | Wt 90.0 lb

## 2022-02-17 DIAGNOSIS — R413 Other amnesia: Secondary | ICD-10-CM

## 2022-02-17 DIAGNOSIS — F03B18 Unspecified dementia, moderate, with other behavioral disturbance: Secondary | ICD-10-CM | POA: Diagnosis not present

## 2022-02-17 MED ORDER — MEMANTINE HCL 10 MG PO TABS: 10.0000 mg | ORAL_TABLET | Freq: Two times a day (BID) | ORAL | 12 refills | Status: DC

## 2022-02-17 NOTE — Patient Instructions (Signed)
  MEMORY DECLINE (likely dementia; also with hypercalcemia, poor PO intake, anxiety) - stop donepezil (GI side effects) - start memantine '10mg'$  at bedtime; increase to twice a day after 1-2 weeks - consider sertraline '25mg'$  daily  - safety / supervision issues reviewed - daily physical activity / exercise (at least 15-30 minutes) - eat more plants / vegetables - increase social activities, brain stimulation, games, puzzles, hobbies, crafts, arts, music - aim for at least 7-8 hours sleep per night (or more) - avoid smoking and alcohol - caregiver resources provided - caution with medications, finances; NO driving

## 2022-02-17 NOTE — Progress Notes (Signed)
[facility]  PATIENT: Julia [name] DOB: 06-Jan-1961  REFERRING CLINICIAN: Redmond School, MD HISTORY FROM: patient  REASON FOR VISIT: new consult    HISTORICAL  CHIEF COMPLAINT:  Chief Complaint  Patient presents with   Dementia    Rm 7 with daughter Julia [name] Patient and brother in law Julia [name]  Pt is well, daughter states she went to doctor about 3 months ago and he felt like she had dementia. Brother in law stated he noticed some behavior changes in the last yr.     HISTORY OF PRESENT ILLNESS:   61 year old female here for evaluation of memory loss.  2 years ago patient was living independently, driving, shopping, taking care of herself.  Around 1 year ago she started having problems.  She was having some GI problems was diagnosed with diverticulitis.  She is also becoming more anxious and afraid of living on her own.  She was spending more time with her daughter and eventually moved in.  Since that time has continued to decline with short-term memory loss, confusion, paranoia, anxiety, hallucinations, pacing and wandering.  She is continue to lose weight.  She is repetitive and anxious about her medications and her p.o. intake.  She has been diagnosed with hypercalcemia and is being treated.  Also has long-term anxiety issue has been on Xanax for many years.   REVIEW OF SYSTEMS: Full 14 system review of systems performed and negative with exception of: as per HPI.  ALLERGIES: Allergies  Allergen Reactions   Propranolol    Naprosyn [Naproxen] Nausea Only   Penicillins Nausea And Vomiting and Other (See Comments)    Has patient had a PCN reaction causing immediate rash, facial/tongue/throat swelling, SOB or lightheadedness with hypotension: No Has patient had a PCN reaction causing severe rash involving mucus membranes or skin necrosis: No Has patient had a PCN reaction that required hospitalization Yes Has patient had a PCN reaction occurring within the last 10  years: No If all of the above answers are "NO", then may proceed with Cephalosporin use.     HOME MEDICATIONS: Outpatient Medications Prior to Visit  Medication Sig Dispense Refill   albuterol (PROVENTIL HFA;VENTOLIN HFA) 108 (90 Base) MCG/ACT inhaler Inhale 1-2 puffs into the lungs every 6 (six) hours as needed for shortness of breath.     alendronate (FOSAMAX) 70 MG tablet Take 1 tablet (70 mg total) by mouth every 7 (seven) days. Take with a full glass of water on an empty stomach. 4 tablet 11   ALPRAZolam (XANAX) 1 MG tablet Take 1 mg by mouth 4 (four) times daily.     cinacalcet (SENSIPAR) 30 MG tablet Take 1 tablet (30 mg total) by mouth 2 (two) times daily with a meal. 60 tablet 3   ciprofloxacin (CIPRO) 500 MG tablet Take 500 mg by mouth 2 (two) times daily.     dicyclomine (BENTYL) 20 MG tablet Take 1 tablet (20 mg total) by mouth 2 (two) times daily. 60 tablet 2   donepezil (ARICEPT) 5 MG tablet Take 5 mg by mouth daily.     ezetimibe (ZETIA) 10 MG tablet Take 10 mg by mouth daily.     furosemide (LASIX) 20 MG tablet Take 20 mg by mouth daily.     losartan (COZAAR) 50 MG tablet Take 50 mg by mouth daily.      methocarbamol (ROBAXIN) 500 MG tablet Take 1 tablet (500 mg total) by mouth 2 (two) times daily. 20 tablet 0   mirtazapine (REMERON  SOL-TAB) 30 MG disintegrating tablet Take 30 mg by mouth at bedtime.     nystatin (MYCOSTATIN) 100000 UNIT/ML suspension Take 5 mLs (500,000 Units total) by mouth 4 (four) times daily. 120 mL 0   oxyCODONE-acetaminophen (PERCOCET/ROXICET) 5-325 MG tablet Take 1 tablet by mouth every 6 (six) hours as needed for severe pain. 20 tablet 0   pantoprazole (PROTONIX) 40 MG tablet Take 40 mg by mouth 2 (two) times daily.     potassium chloride SA (KLOR-CON) 20 MEQ tablet Take 20 mEq by mouth daily.     pravastatin (PRAVACHOL) 40 MG tablet Take 40 mg by mouth daily.     promethazine (PHENERGAN) 25 MG tablet Take 1 tablet (25 mg total) by mouth every 6  (six) hours as needed for nausea or vomiting. 20 tablet 0   propranolol (INDERAL) 20 MG tablet Take 20 mg by mouth daily.     predniSONE (DELTASONE) 10 MG tablet 6, 5, 4, 3, 2 then 1 tablet by mouth daily for 6 days total. (Patient not taking: Reported on 02/17/2022) 21 tablet 0   promethazine (PHENERGAN) 25 MG suppository Place 1 suppository (25 mg total) rectally every 6 (six) hours as needed for nausea or vomiting. (Patient not taking: Reported on 11/06/2021) 12 each 1   No facility-administered medications prior to visit.    PAST MEDICAL HISTORY: Past Medical History:  Diagnosis Date   Anxiety    Brain tumor (benign) (HCC)    Chronic pain    Depression    Elevated cholesterol    Headache(784.0)    Hypertension    Numerous moles    Osteoporosis    Seizures ([place])    Trichimoniasis     PAST SURGICAL HISTORY: Past Surgical History:  Procedure Laterality Date   EXCISION OF SKIN TAG N/A 09/22/2017   Procedure: EXCISION OF SKIN TAGS ON FACE AND NECK (Procedure #2);  Surgeon: Jonnie Kind, MD;  Location: [facility];  Service: Gynecology;  Laterality: N/A;   HYSTEROSCOPY WITH D & C N/A 09/22/2017   Procedure: DILATATION AND CURETTAGE /HYSTEROSCOPY; EXCISION OF VULVAR AND RIGHT AND LEFT THIGH SKIN TAGS (Procedure #1);  Surgeon: Jonnie Kind, MD;  Location: [facility];  Service: Gynecology;  Laterality: N/A;   POLYPECTOMY N/A 09/22/2017   Procedure: REMOVAL OF ENDOMETRIAL POLYP (Procedure #1);  Surgeon: Jonnie Kind, MD;  Location: [facility];  Service: Gynecology;  Laterality: N/A;   TUBAL LIGATION      FAMILY HISTORY: Family History  Problem Relation Age of Onset   Cancer Father        lung   Stroke Mother    Heart failure Mother    Cancer Brother    Diabetes Brother     SOCIAL HISTORY: Social History   Socioeconomic History   Marital status: Widowed    Spouse name: Not on file   Number of children: 3   Years of education: Not on file   Highest education level: Not on  file  Occupational History   Not on file  Tobacco Use   Smoking status: Never   Smokeless tobacco: Never  Vaping Use   Vaping Use: Never used  Substance and Sexual Activity   Alcohol use: No   Drug use: No   Sexual activity: Not Currently    Birth control/protection: Post-menopausal, Surgical    Comment: tubal  Other Topics Concern   Not on file  Social History Narrative   Not on file   Social Determinants of Health  Financial Resource Strain: Not on file  Food Insecurity: Not on file  Transportation Needs: Not on file  Physical Activity: Not on file  Stress: Not on file  Social Connections: Not on file  Intimate Partner Violence: Not on file     PHYSICAL EXAM  GENERAL EXAM/CONSTITUTIONAL: Vitals:  Vitals:   02/17/22 1541  BP: 108/71  Pulse: 71  Weight: 90 lb (40.8 kg)  Height: 5' (1.524 m)   Body mass index is 17.58 kg/m. Wt Readings from Last 3 Encounters:  02/17/22 90 lb (40.8 kg)  02/13/22 90 lb 3.2 oz (40.9 kg)  12/28/21 105 lb 13.1 oz (48 kg)   Patient is in no distress; well developed, nourished and groomed; neck is supple  CARDIOVASCULAR: Examination of carotid arteries is normal; no carotid bruits Regular rate and rhythm, no murmurs Examination of peripheral vascular system by observation and palpation is normal  EYES: Ophthalmoscopic exam of optic discs and posterior segments is normal; no papilledema or hemorrhages No results found.  MUSCULOSKELETAL: Gait, strength, tone, movements noted in Neurologic exam below  NEUROLOGIC: MENTAL STATUS:     02/17/2022    4:00 PM 02/17/2022    3:46 PM  MMSE - Mini Mental State Exam  Not completed:  Unable to complete  Orientation to time 4   Orientation to Place 1   Registration 3   Attention/ Calculation 0   Recall 0   Language- name 2 objects 2   Language- repeat 0   Language- follow 3 step command 1   Language- read & follow direction 1   Write a sentence 1   Copy design 0   Total  score 13    awake, alert, oriented to person [place] language fluent, comprehension intact, naming intact fund of knowledge appropriate PRESSURED SPEECH, REPETITIVE; ANXIOUS FRAIL APPEARING MOTOR APRAXIA  CRANIAL NERVE:  2nd - no papilledema on fundoscopic exam 2nd, 3rd, 4th, 6th - pupils equal and reactive to light, visual fields full to confrontation, extraocular muscles intact, no nystagmus 5th - facial sensation symmetric 7th - facial strength symmetric 8th - hearing intact 9th - palate elevates symmetrically, uvula midline 11th - shoulder shrug symmetric 12th - tongue protrusion midline  MOTOR:  normal bulk and tone, full strength in the BUE, BLE  SENSORY:  normal and symmetric to light touch, temperature, vibration  COORDINATION:  finger-nose-finger, fine finger movements normal  REFLEXES:  deep tendon reflexes TRACE and symmetric  GAIT/STATION:  narrow based gait     DIAGNOSTIC DATA (LABS, IMAGING, TESTING) - I reviewed patient records, labs, notes, testing and imaging myself where available.  Lab Results  Component Value Date   WBC 4.4 12/28/2021   HGB 13.3 12/28/2021   HCT 37.6 12/28/2021   MCV 86.8 12/28/2021   PLT 226 12/28/2021      Component Value Date/Time   NA 131 (L) 12/28/2021 1029   K 4.5 12/28/2021 1029   CL 101 12/28/2021 1029   CO2 21 (L) 12/28/2021 1029   GLUCOSE 123 (H) 12/28/2021 1029   BUN 14 12/28/2021 1029   CREATININE 1.03 (H) 12/28/2021 1029   CALCIUM 12.0 (H) 02/03/2022 1206   PROT 7.3 12/28/2021 1029   ALBUMIN 4.6 12/28/2021 1029   AST 25 12/28/2021 1029   ALT 25 12/28/2021 1029   ALKPHOS 52 12/28/2021 1029   BILITOT 0.8 12/28/2021 1029   GFRNONAA >60 12/28/2021 1029   GFRAA >60 09/17/2017 1323   No results found for: "CHOL", "HDL", "LDLCALC", "LDLDIRECT", "  TRIG", "CHOLHDL" No results found for: "HGBA1C" No results found for: "VITAMINB12" No results found for: "TSH"  07/26/21 MRI brain 1. No  acute intracranial pathology. 2. Moderate underlying chronic white matter microangiopathy. 3. Unchanged 1.1 cm right frontal meningioma.    ASSESSMENT AND PLAN  61 y.o. year old female here with:   Dx:  1. Moderate dementia with other behavioral disturbance, unspecified dementia type ([place])   2. Memory loss     PLAN:  MEMORY DECLINE (mmse 13/30; decline in ADLs; likely moderate dementia with behavioral disturbances; also with hypercalcemia, poor PO intake, anxiety) - stop donepezil (GI side effects) - start memantine '10mg'$  at bedtime; increase to twice a day after 1-2 weeks - consider sertraline '25mg'$  daily  - safety / supervision issues reviewed - daily physical activity / exercise (at least 15-30 minutes) - eat more plants / vegetables - increase social activities, brain stimulation, games, puzzles, hobbies, crafts, arts, music - aim for at least 7-8 hours sleep per night (or more) - avoid smoking and alcohol - caregiver resources provided - caution with medications, finances; NO driving  Meds ordered this encounter  Medications   memantine (NAMENDA) 10 MG tablet    Sig: Take 1 tablet (10 mg total) by mouth 2 (two) times daily.    Dispense:  60 tablet    Refill:  12   Return for pending if symptoms worsen or fail to improve, return to PCP.  I spent 65 minutes of face-to-face and non-face-to-face time with patient.  This included previsit chart review, lab review, study review, order entry, electronic health record documentation, patient education.     Penni Bombard, MD 32/54/9826, 4:15 PM Certified in Neurology, Neurophysiology and Neuroimaging  [facility] [address] [place],  [address] [phone]

## 2022-02-18 ENCOUNTER — Emergency Department (HOSPITAL_COMMUNITY)
Admission: EM | Admit: 2022-02-18 | Discharge: 2022-02-19 | Disposition: A | Discharge status: Home / Self Care | Payer: Medicare HMO | Attending: Emergency Medicine | Admitting: Emergency Medicine

## 2022-02-18 ENCOUNTER — Emergency Department (HOSPITAL_COMMUNITY): Payer: Medicare HMO

## 2022-02-18 ENCOUNTER — Other Ambulatory Visit: Payer: Self-pay

## 2022-02-18 DIAGNOSIS — I10 Essential (primary) hypertension: Secondary | ICD-10-CM | POA: Diagnosis not present

## 2022-02-18 DIAGNOSIS — G309 Alzheimer's disease, unspecified: Secondary | ICD-10-CM | POA: Diagnosis not present

## 2022-02-18 DIAGNOSIS — E860 Dehydration: Secondary | ICD-10-CM | POA: Diagnosis not present

## 2022-02-18 DIAGNOSIS — R627 Adult failure to thrive: Secondary | ICD-10-CM | POA: Diagnosis not present

## 2022-02-18 DIAGNOSIS — Z79899 Other long term (current) drug therapy: Secondary | ICD-10-CM | POA: Diagnosis not present

## 2022-02-18 DIAGNOSIS — E875 Hyperkalemia: Secondary | ICD-10-CM | POA: Insufficient documentation

## 2022-02-18 DIAGNOSIS — F0390 Unspecified dementia without behavioral disturbance: Secondary | ICD-10-CM | POA: Insufficient documentation

## 2022-02-18 DIAGNOSIS — Z1152 Encounter for screening for COVID-19: Secondary | ICD-10-CM | POA: Insufficient documentation

## 2022-02-18 DIAGNOSIS — R531 Weakness: Secondary | ICD-10-CM | POA: Diagnosis not present

## 2022-02-18 DIAGNOSIS — R4182 Altered mental status, unspecified: Secondary | ICD-10-CM | POA: Diagnosis present

## 2022-02-18 DIAGNOSIS — R5381 Other malaise: Secondary | ICD-10-CM | POA: Diagnosis not present

## 2022-02-18 LAB — CBC WITH DIFFERENTIAL/PLATELET
Abs Immature Granulocytes: 0.01 10*3/uL (ref 0.00–0.07)
Basophils Absolute: 0 10*3/uL (ref 0.0–0.1)
Basophils Relative: 1 %
Eosinophils Absolute: 0.1 10*3/uL (ref 0.0–0.5)
Eosinophils Relative: 1 %
HCT: 37.9 % (ref 36.0–46.0)
Hemoglobin: 12.6 g/dL (ref 12.0–15.0)
Immature Granulocytes: 0 %
Lymphocytes Relative: 32 %
Lymphs Abs: 2.1 10*3/uL (ref 0.7–4.0)
MCH: 30.3 pg (ref 26.0–34.0)
MCHC: 33.2 g/dL (ref 30.0–36.0)
MCV: 91.1 fL (ref 80.0–100.0)
Monocytes Absolute: 0.8 10*3/uL (ref 0.1–1.0)
Monocytes Relative: 12 %
Neutro Abs: 3.5 10*3/uL (ref 1.7–7.7)
Neutrophils Relative %: 54 %
Platelets: 147 10*3/uL — ABNORMAL LOW (ref 150–400)
RBC: 4.16 MIL/uL (ref 3.87–5.11)
RDW: 13.5 % (ref 11.5–15.5)
WBC: 6.4 10*3/uL (ref 4.0–10.5)
nRBC: 0 % (ref 0.0–0.2)

## 2022-02-18 LAB — URINALYSIS, ROUTINE W REFLEX MICROSCOPIC
Bacteria, UA: NONE SEEN
Bilirubin Urine: NEGATIVE
Glucose, UA: NEGATIVE mg/dL
Hgb urine dipstick: NEGATIVE
Ketones, ur: NEGATIVE mg/dL
Nitrite: NEGATIVE
Protein, ur: NEGATIVE mg/dL
Specific Gravity, Urine: 1.009 (ref 1.005–1.030)
pH: 5 (ref 5.0–8.0)

## 2022-02-18 LAB — COMPREHENSIVE METABOLIC PANEL
ALT: 15 U/L (ref 0–44)
AST: 19 U/L (ref 15–41)
Albumin: 4 g/dL (ref 3.5–5.0)
Alkaline Phosphatase: 52 U/L (ref 38–126)
Anion gap: 10 (ref 5–15)
BUN: 26 mg/dL — ABNORMAL HIGH (ref 8–23)
CO2: 24 mmol/L (ref 22–32)
Calcium: 9.7 mg/dL (ref 8.9–10.3)
Chloride: 106 mmol/L (ref 98–111)
Creatinine, Ser: 1.5 mg/dL — ABNORMAL HIGH (ref 0.44–1.00)
GFR, Estimated: 39 mL/min — ABNORMAL LOW (ref >60)
Glucose, Bld: 105 mg/dL — ABNORMAL HIGH (ref 70–99)
Potassium: 3.2 mmol/L — ABNORMAL LOW (ref 3.5–5.1)
Sodium: 140 mmol/L (ref 135–145)
Total Bilirubin: 1.3 mg/dL — ABNORMAL HIGH (ref 0.3–1.2)
Total Protein: 6.9 g/dL (ref 6.5–8.1)

## 2022-02-18 LAB — RESP PANEL BY RT-PCR (RSV, FLU A&B, COVID)  RVPGX2
Influenza A by PCR: NEGATIVE
Influenza B by PCR: NEGATIVE
Resp Syncytial Virus by PCR: NEGATIVE
SARS Coronavirus 2 by RT PCR: NEGATIVE

## 2022-02-18 LAB — POC URINE PREG, ED: Preg Test, Ur: NEGATIVE

## 2022-02-18 MED ORDER — SODIUM CHLORIDE 0.9 % IV BOLUS
1000.0000 mL | Freq: Once | INTRAVENOUS | Status: AC
  Administered 2022-02-18: 1000 mL via INTRAVENOUS

## 2022-02-18 NOTE — ED Provider Notes (Signed)
[facility] EMERGENCY DEPARTMENT Provider Note   CSN: [id number] Arrival date & time: 02/18/22  1945     History  Chief Complaint  Patient presents with   Altered Mental Status    Julia [name] is a 61 y.o. female.   Altered Mental Status Patient brought in for mental status change.  History of dementia that has been worsening recently.  Lives with one of her daughters.  Reportedly has not been eating and drinking much.  Seen by neurology yesterday.  Patient states she is worried she may need to go live somewhere else and they are going to try and place her somewhere besides her house.  Not much of an appetite.  No definite dysuria.  Has been having some confusion     Home Medications Prior to Admission medications   Medication Sig Start Date End Date Taking? Authorizing Provider  alendronate (FOSAMAX) 70 MG tablet Take 1 tablet (70 mg total) by mouth every 7 (seven) days. Take with a full glass of water on an empty stomach. 06/20/21  Yes Nida, Marella Chimes, MD  ALPRAZolam Duanne Moron) 1 MG tablet Take 1 mg by mouth 4 (four) times daily.   Yes [provider]  cinacalcet (SENSIPAR) 30 MG tablet Take 1 tablet (30 mg total) by mouth 2 (two) times daily with a meal. 02/13/22  Yes Nida, Marella Chimes, MD  dicyclomine (BENTYL) 20 MG tablet Take 1 tablet (20 mg total) by mouth 2 (two) times daily. 07/10/21  Yes Sharyn Creamer, MD  ezetimibe (ZETIA) 10 MG tablet Take 10 mg by mouth daily.   Yes [provider]  furosemide (LASIX) 20 MG tablet Take 20 mg by mouth daily.   Yes [provider]  losartan (COZAAR) 50 MG tablet Take 50 mg by mouth daily.  09/01/13  Yes [provider]  mirtazapine (REMERON SOL-TAB) 30 MG disintegrating tablet Take 30 mg by mouth at bedtime.   Yes [provider]  oxyCODONE-acetaminophen (PERCOCET/ROXICET) 5-325 MG tablet Take 1 tablet by mouth every 6 (six) hours as needed for severe pain. 12/28/21  Yes Milton Ferguson,  MD  pantoprazole (PROTONIX) 40 MG tablet Take 40 mg by mouth 2 (two) times daily. 05/02/21  Yes [provider]  potassium chloride SA (KLOR-CON) 20 MEQ tablet Take 20 mEq by mouth daily. 08/02/20  Yes [provider]  pravastatin (PRAVACHOL) 40 MG tablet Take 40 mg by mouth daily. 06/03/21  Yes [provider]  albuterol (PROVENTIL HFA;VENTOLIN HFA) 108 (90 Base) MCG/ACT inhaler Inhale 1-2 puffs into the lungs every 6 (six) hours as needed for shortness of breath. 09/18/17   [provider]  ciprofloxacin (CIPRO) 500 MG tablet Take 500 mg by mouth 2 (two) times daily. Patient not taking: Reported on 02/18/2022 02/11/22   [provider]  donepezil (ARICEPT) 5 MG tablet Take 5 mg by mouth daily. Patient not taking: Reported on 02/18/2022 02/03/22   [provider]  memantine (NAMENDA) 10 MG tablet Take 1 tablet (10 mg total) by mouth 2 (two) times daily. Patient taking differently: Take 10 mg by mouth 2 (two) times daily. Take 10 mg by mouth once daily for 14 days, then take 10 mg twice a day 02/17/22   Penumalli, Earlean Polka, MD  methocarbamol (ROBAXIN) 500 MG tablet Take 1 tablet (500 mg total) by mouth 2 (two) times daily. Patient not taking: Reported on 02/18/2022 11/15/21   Prosperi, Christian H, PA-C  nystatin (MYCOSTATIN) 100000 UNIT/ML suspension Take 5 mLs (  500,000 Units total) by mouth 4 (four) times daily. Patient not taking: Reported on 02/18/2022 09/26/21   Volney American, PA-C      Allergies    Propranolol, Naprosyn [naproxen], and Penicillins    Review of Systems   Review of Systems  Physical Exam Updated Vital Signs BP (!) 114/94   Pulse 61   Temp 97.9 F (36.6 C) (Oral)   Resp 15   LMP 09/06/2012   SpO2 100%  Physical Exam Vitals and nursing note reviewed.  Eyes:     Pupils: Pupils are equal, round, and reactive to light.  Cardiovascular:     Rate and Rhythm: Normal rate and regular rhythm.  Pulmonary:      Breath sounds: No wheezing.  Abdominal:     Tenderness: There is no abdominal tenderness.  Musculoskeletal:     Cervical back: Neck supple.  Skin:    General: Skin is warm.  Neurological:     Mental Status: She is alert and oriented to person, place, and time.     ED Results / Procedures / Treatments   Labs (all labs ordered are listed, but only abnormal results are displayed) Labs Reviewed  URINALYSIS, ROUTINE W REFLEX MICROSCOPIC - Abnormal; Notable for the following components:      Result Value   Leukocytes,Ua MODERATE (*)    All other components within normal limits  COMPREHENSIVE METABOLIC PANEL - Abnormal; Notable for the following components:   Potassium 3.2 (*)    Glucose, Bld 105 (*)    BUN 26 (*)    Creatinine, Ser 1.50 (*)    Total Bilirubin 1.3 (*)    GFR, Estimated 39 (*)    All other components within normal limits  CBC WITH DIFFERENTIAL/PLATELET - Abnormal; Notable for the following components:   Platelets 147 (*)    All other components within normal limits  RESP PANEL BY RT-PCR (RSV, FLU A&B, COVID)  RVPGX2  POC URINE PREG, ED    EKG None  Radiology DG Chest Portable 1 View  Result Date: 02/18/2022 CLINICAL DATA:  Weakness EXAM: PORTABLE CHEST 1 VIEW COMPARISON:  None Available. FINDINGS: The heart size and mediastinal contours are within normal limits. Both lungs are clear. The visualized skeletal structures are unremarkable. IMPRESSION: No active disease. Electronically Signed   By: Fidela Salisbury M.D.   On: 02/18/2022 20:38    Procedures Procedures    Medications Ordered in ED Medications  sodium chloride 0.9 % bolus 1,000 mL (0 mLs Intravenous Stopped 02/18/22 2337)    ED Course/ Medical Decision Making/ A&P                           Medical Decision Making Amount and/or Complexity of Data Reviewed Labs: ordered. Radiology: ordered.   Patient with generalized weakness.  More confusion.  History of dementia.  Not doing as well at  home.  Does have a new mild creatinine elevation.  I think mostly likely dehydration.  Will give IV fluids.  Urine has some leukocytes but not necessarily infection.  Has had previous head CT and no new imaging needed.  Urine does not show infection.  Creatinine is mildly elevated.  Could be component of dehydration.  Do not think we have admission criteria with this however.  Will have patient seen by transition to care tomorrow.  Patient is medically cleared.        Final Clinical Impression(s) / ED Diagnoses Final diagnoses:  Dehydration  Dementia without behavioral disturbance, psychotic disturbance, mood disturbance, or anxiety, unspecified dementia severity, unspecified dementia type [facility])    Rx / DC Orders ED Discharge Orders     None         Davonna Belling, MD 02/19/22 0001

## 2022-02-18 NOTE — ED Provider Notes (Signed)
  Provider Note MRN:  [id number]  Arrival date & time: 02/19/22    ED Course and Medical Decision Making  Assumed care from Dr. Alvino Chapel at shift change.  Worsening dementia, mild dehydration but no indication for medical admission, providing fluids, will hold for TOC in the morning.  12:12 AM update: Patient's other daughter has arrived and would like to take patient home, she is fully comfortable taking care of the patient.  No indication for further testing or admission.  Appropriate for discharge.  Procedures  Final Clinical Impressions(s) / ED Diagnoses     ICD-10-CM   1. Dehydration  E86.0     2. Dementia without behavioral disturbance, psychotic disturbance, mood disturbance, or anxiety, unspecified dementia severity, unspecified dementia type [facility])  F03.90       ED Discharge Orders     None       Discharge Instructions   None     Barth Kirks. Sedonia Small, [place] [email or web address]    Maudie Flakes, MD 02/19/22 [phone]

## 2022-02-18 NOTE — ED Triage Notes (Signed)
Pt BIB [facility] from home. Per EMS pts family cannot take care of her as her dementia has gotten worse, they are having a hard time getting pt to take medications and pt has had recent urinary incontinence.

## 2022-02-19 ENCOUNTER — Other Ambulatory Visit: Payer: Self-pay

## 2022-02-19 ENCOUNTER — Emergency Department (HOSPITAL_COMMUNITY)
Admission: EM | Admit: 2022-02-19 | Discharge: 2022-02-20 | Disposition: A | Discharge status: Home / Self Care | Payer: Medicare HMO | Source: Home / Self Care

## 2022-02-19 ENCOUNTER — Encounter (HOSPITAL_COMMUNITY): Payer: Self-pay | Admitting: *Deleted

## 2022-02-19 DIAGNOSIS — Z79899 Other long term (current) drug therapy: Secondary | ICD-10-CM | POA: Insufficient documentation

## 2022-02-19 DIAGNOSIS — R627 Adult failure to thrive: Secondary | ICD-10-CM | POA: Insufficient documentation

## 2022-02-19 DIAGNOSIS — F0390 Unspecified dementia without behavioral disturbance: Secondary | ICD-10-CM | POA: Insufficient documentation

## 2022-02-19 DIAGNOSIS — R5381 Other malaise: Secondary | ICD-10-CM | POA: Diagnosis not present

## 2022-02-19 DIAGNOSIS — I10 Essential (primary) hypertension: Secondary | ICD-10-CM | POA: Insufficient documentation

## 2022-02-19 DIAGNOSIS — E875 Hyperkalemia: Secondary | ICD-10-CM | POA: Insufficient documentation

## 2022-02-19 DIAGNOSIS — E860 Dehydration: Secondary | ICD-10-CM | POA: Diagnosis not present

## 2022-02-19 DIAGNOSIS — R69 Illness, unspecified: Secondary | ICD-10-CM | POA: Diagnosis not present

## 2022-02-19 LAB — BASIC METABOLIC PANEL
Anion gap: 11 (ref 5–15)
BUN: 21 mg/dL (ref 8–23)
CO2: 23 mmol/L (ref 22–32)
Calcium: 9.3 mg/dL (ref 8.9–10.3)
Chloride: 105 mmol/L (ref 98–111)
Creatinine, Ser: 1.24 mg/dL — ABNORMAL HIGH (ref 0.44–1.00)
GFR, Estimated: 49 mL/min — ABNORMAL LOW (ref >60)
Glucose, Bld: 99 mg/dL (ref 70–99)
Potassium: 3.2 mmol/L — ABNORMAL LOW (ref 3.5–5.1)
Sodium: 139 mmol/L (ref 135–145)

## 2022-02-19 LAB — CBC
HCT: 40.4 % (ref 36.0–46.0)
Hemoglobin: 13.5 g/dL (ref 12.0–15.0)
MCH: 30.4 pg (ref 26.0–34.0)
MCHC: 33.4 g/dL (ref 30.0–36.0)
MCV: 91 fL (ref 80.0–100.0)
Platelets: 163 10*3/uL (ref 150–400)
RBC: 4.44 MIL/uL (ref 3.87–5.11)
RDW: 13.4 % (ref 11.5–15.5)
WBC: 6.8 10*3/uL (ref 4.0–10.5)
nRBC: 0 % (ref 0.0–0.2)

## 2022-02-19 LAB — MAGNESIUM: Magnesium: 1.8 mg/dL (ref 1.7–2.4)

## 2022-02-19 MED ORDER — LEVOTHYROXINE SODIUM 50 MCG PO TABS
25.0000 ug | ORAL_TABLET | Freq: Every day | ORAL | Status: DC
  Filled 2022-02-19: qty 1

## 2022-02-19 MED ORDER — OXYCODONE HCL 5 MG PO TABS
5.0000 mg | ORAL_TABLET | Freq: Four times a day (QID) | ORAL | Status: DC | PRN
  Administered 2022-02-19: 5 mg via ORAL
  Filled 2022-02-19: qty 1

## 2022-02-19 MED ORDER — MIRTAZAPINE 30 MG PO TBDP
30.0000 mg | ORAL_TABLET | Freq: Every day | ORAL | Status: DC
  Filled 2022-02-19 (×2): qty 1

## 2022-02-19 MED ORDER — QUETIAPINE FUMARATE 25 MG PO TABS
25.0000 mg | ORAL_TABLET | Freq: Once | ORAL | Status: AC
  Administered 2022-02-19: 25 mg via ORAL
  Filled 2022-02-19: qty 1

## 2022-02-19 MED ORDER — EZETIMIBE 10 MG PO TABS
10.0000 mg | ORAL_TABLET | Freq: Every day | ORAL | Status: DC
  Administered 2022-02-20: 10 mg via ORAL
  Filled 2022-02-19: qty 1

## 2022-02-19 MED ORDER — SODIUM CHLORIDE 0.9 % IV BOLUS: 1000.0000 mL | Freq: Once | INTRAVENOUS | Status: DC

## 2022-02-19 MED ORDER — PRAVASTATIN SODIUM 40 MG PO TABS
40.0000 mg | ORAL_TABLET | Freq: Every day | ORAL | Status: DC
  Administered 2022-02-20: 40 mg via ORAL
  Filled 2022-02-19: qty 1

## 2022-02-19 MED ORDER — ALPRAZOLAM 0.5 MG PO TABS: 1.0000 mg | ORAL_TABLET | Freq: Four times a day (QID) | ORAL | Status: DC

## 2022-02-19 MED ORDER — ALBUTEROL SULFATE HFA 108 (90 BASE) MCG/ACT IN AERS: 1.0000 | INHALATION_SPRAY | Freq: Four times a day (QID) | RESPIRATORY_TRACT | Status: DC | PRN

## 2022-02-19 MED ORDER — EZETIMIBE 10 MG PO TABS: 10.0000 mg | ORAL_TABLET | Freq: Every day | ORAL | Status: DC

## 2022-02-19 MED ORDER — LOSARTAN POTASSIUM 25 MG PO TABS
50.0000 mg | ORAL_TABLET | Freq: Every day | ORAL | Status: DC
  Administered 2022-02-20: 50 mg via ORAL
  Filled 2022-02-19: qty 2

## 2022-02-19 MED ORDER — ACETAMINOPHEN 325 MG PO TABS: 325.0000 mg | ORAL_TABLET | ORAL | Status: DC | PRN

## 2022-02-19 MED ORDER — ALBUTEROL SULFATE (2.5 MG/3ML) 0.083% IN NEBU: 2.5000 mg | INHALATION_SOLUTION | Freq: Four times a day (QID) | RESPIRATORY_TRACT | Status: DC | PRN

## 2022-02-19 MED ORDER — DICYCLOMINE HCL 10 MG PO CAPS
20.0000 mg | ORAL_CAPSULE | Freq: Two times a day (BID) | ORAL | Status: DC
  Administered 2022-02-20: 20 mg via ORAL
  Filled 2022-02-19 (×2): qty 2

## 2022-02-19 MED ORDER — MELATONIN 3 MG PO TABS
3.0000 mg | ORAL_TABLET | Freq: Once | ORAL | Status: DC
  Filled 2022-02-19: qty 1

## 2022-02-19 MED ORDER — FUROSEMIDE 40 MG PO TABS
20.0000 mg | ORAL_TABLET | Freq: Every day | ORAL | Status: DC
  Administered 2022-02-20: 20 mg via ORAL
  Filled 2022-02-19: qty 1

## 2022-02-19 MED ORDER — POTASSIUM CHLORIDE CRYS ER 20 MEQ PO TBCR
20.0000 meq | EXTENDED_RELEASE_TABLET | Freq: Every day | ORAL | Status: DC
  Administered 2022-02-20: 20 meq via ORAL
  Filled 2022-02-19: qty 1

## 2022-02-19 MED ORDER — PANTOPRAZOLE SODIUM 40 MG PO TBEC
40.0000 mg | DELAYED_RELEASE_TABLET | Freq: Two times a day (BID) | ORAL | Status: DC
  Administered 2022-02-19 – 2022-02-20 (×2): 40 mg via ORAL
  Filled 2022-02-19 (×2): qty 1

## 2022-02-19 MED ORDER — POTASSIUM CHLORIDE CRYS ER 20 MEQ PO TBCR: 40.0000 meq | EXTENDED_RELEASE_TABLET | Freq: Once | ORAL | Status: DC

## 2022-02-19 MED ORDER — CINACALCET HCL 30 MG PO TABS
30.0000 mg | ORAL_TABLET | Freq: Two times a day (BID) | ORAL | Status: DC
  Filled 2022-02-19 (×4): qty 1

## 2022-02-19 MED ORDER — ALPRAZOLAM 0.5 MG PO TABS
1.0000 mg | ORAL_TABLET | Freq: Four times a day (QID) | ORAL | Status: DC | PRN
  Administered 2022-02-19 – 2022-02-20 (×2): 1 mg via ORAL
  Filled 2022-02-19 (×2): qty 2

## 2022-02-19 NOTE — ED Triage Notes (Signed)
Pt brought in by [facility] for c/o family states they are unable to take care of pt at home due to her not wanting to eat, drink, or take her meds

## 2022-02-19 NOTE — ED Provider Notes (Signed)
[facility] EMERGENCY DEPARTMENT Provider Note   CSN: [id number] Arrival date & time: 02/19/22  1509     History  Chief Complaint  Patient presents with   Failure To Thrive    Julia [name] is a 61 y.o. female.  HPI   61 year old female presents emergency department via EMS for decreased p.o. intake, refusal to take at home meds as well as failure to thrive.  Daughter is primary historian.  Daughter states that patient has had worsening cognitive decline over the past several months of which no noticeable acute worsening over the past few days/week.  Daughter states that over the past 2 to 3 days, patient has been refusing to eat, take her home medicines as well as been urinating and defecating all over herself without warning.  Daughter expresses inability to take care of mother at home any longer and is requesting placement.  Patient without fever, chills, night sweats, chest pain, shortness of breath, abdominal pain, nausea, vomiting, urinary/vaginal symptoms, change in bowel habits.  Past medical history significant for dementia, seizure, hypertension, hyperlipidemia, hyperparathyroidism, hypercalcemia,  Home Medications Prior to Admission medications   Medication Sig Start Date End Date Taking? Authorizing Provider  albuterol (PROVENTIL HFA;VENTOLIN HFA) 108 (90 Base) MCG/ACT inhaler Inhale 1-2 puffs into the lungs every 6 (six) hours as needed for shortness of breath. 09/18/17  Yes [provider]  ALPRAZolam Duanne Moron) 1 MG tablet Take 1 mg by mouth 4 (four) times daily.   Yes [provider]  cinacalcet (SENSIPAR) 30 MG tablet Take 1 tablet (30 mg total) by mouth 2 (two) times daily with a meal. 02/13/22  Yes Nida, Marella Chimes, MD  dicyclomine (BENTYL) 20 MG tablet Take 1 tablet (20 mg total) by mouth 2 (two) times daily. 07/10/21  Yes Sharyn Creamer, MD  ezetimibe (ZETIA) 10 MG tablet Take 10 mg by mouth daily.   Yes [provider]  furosemide  (LASIX) 20 MG tablet Take 20 mg by mouth daily.   Yes [provider]  levothyroxine (SYNTHROID) 25 MCG tablet Take 25 mcg by mouth daily before breakfast.   Yes [provider]  losartan (COZAAR) 50 MG tablet Take 50 mg by mouth daily.  09/01/13  Yes [provider]  memantine (NAMENDA) 10 MG tablet Take 1 tablet (10 mg total) by mouth 2 (two) times daily. Patient taking differently: Take 10 mg by mouth 2 (two) times daily. Take 10 mg by mouth once daily for 14 days, then take 10 mg twice a day 02/17/22  Yes Penumalli, Vikram R, MD  mirtazapine (REMERON SOL-TAB) 30 MG disintegrating tablet Take 30 mg by mouth at bedtime.   Yes [provider]  oxyCODONE-acetaminophen (PERCOCET/ROXICET) 5-325 MG tablet Take 1 tablet by mouth every 6 (six) hours as needed for severe pain. 12/28/21  Yes Milton Ferguson, MD  pantoprazole (PROTONIX) 40 MG tablet Take 40 mg by mouth 2 (two) times daily. 05/02/21  Yes [provider]  potassium chloride SA (KLOR-CON) 20 MEQ tablet Take 20 mEq by mouth daily. 08/02/20  Yes [provider]  pravastatin (PRAVACHOL) 40 MG tablet Take 40 mg by mouth daily. 06/03/21  Yes [provider]  alendronate (FOSAMAX) 70 MG tablet Take 1 tablet (70 mg total) by mouth every 7 (seven) days. Take with a full glass of water on an empty stomach. 06/20/21   Cassandria Anger, MD  donepezil (ARICEPT) 5 MG tablet Take 5 mg by mouth daily. Patient not taking: Reported on  02/18/2022 02/03/22   [provider]  methocarbamol (ROBAXIN) 500 MG tablet Take 1 tablet (500 mg total) by mouth 2 (two) times daily. Patient not taking: Reported on 02/18/2022 11/15/21   Prosperi, Christian H, PA-C  nystatin (MYCOSTATIN) 100000 UNIT/ML suspension Take 5 mLs (500,000 Units total) by mouth 4 (four) times daily. Patient not taking: Reported on 02/18/2022 09/26/21   Volney American, PA-C      Allergies    Propranolol, Naprosyn  [naproxen], and Penicillins    Review of Systems   Review of Systems  All other systems reviewed and are negative.   Physical Exam Updated Vital Signs BP 113/82 (BP Location: Right Arm)   Pulse 67   Temp 98.2 F (36.8 C) (Oral)   Resp 18   Ht 5' (1.524 m)   Wt 40.8 kg   LMP 09/06/2012   SpO2 100%   BMI 17.57 kg/m  Physical Exam Vitals and nursing note reviewed.  Constitutional:      General: She is not in acute distress.    Appearance: She is well-developed.     Comments: Patient alert and orientated x 3  HENT:     Head: Normocephalic and atraumatic.  Eyes:     Conjunctiva/sclera: Conjunctivae normal.  Cardiovascular:     Rate and Rhythm: Normal rate and regular rhythm.     Heart sounds: No murmur heard. Pulmonary:     Effort: Pulmonary effort is normal. No respiratory distress.     Breath sounds: Normal breath sounds.  Abdominal:     Palpations: Abdomen is soft.     Tenderness: There is no abdominal tenderness.  Musculoskeletal:        General: No swelling.     Cervical back: Neck supple.  Skin:    General: Skin is warm and dry.     Capillary Refill: Capillary refill takes less than 2 seconds.  Neurological:     Mental Status: She is alert.  Psychiatric:        Mood and Affect: Mood normal.     ED Results / Procedures / Treatments   Labs (all labs ordered are listed, but only abnormal results are displayed) Labs Reviewed  BASIC METABOLIC PANEL - Abnormal; Notable for the following components:      Result Value   Potassium 3.2 (*)    Creatinine, Ser 1.24 (*)    GFR, Estimated 49 (*)    All other components within normal limits  CBC  MAGNESIUM    EKG None  Radiology DG Chest Portable 1 View  Result Date: 02/18/2022 CLINICAL DATA:  Weakness EXAM: PORTABLE CHEST 1 VIEW COMPARISON:  None Available. FINDINGS: The heart size and mediastinal contours are within normal limits. Both lungs are clear. The visualized skeletal structures are unremarkable.  IMPRESSION: No active disease. Electronically Signed   By: Fidela Salisbury M.D.   On: 02/18/2022 20:38    Procedures Procedures    Medications Ordered in ED Medications  cinacalcet (SENSIPAR) tablet 30 mg (has no administration in time range)  dicyclomine (BENTYL) capsule 20 mg (has no administration in time range)  furosemide (LASIX) tablet 20 mg (has no administration in time range)  levothyroxine (SYNTHROID) tablet 25 mcg (has no administration in time range)  losartan (COZAAR) tablet 50 mg (has no administration in time range)  mirtazapine (REMERON SOL-TAB) disintegrating tablet 30 mg (has no administration in time range)  oxyCODONE (Oxy IR/ROXICODONE) immediate release tablet 5 mg (has no administration in time range)  acetaminophen (TYLENOL) tablet  325 mg (has no administration in time range)  pantoprazole (PROTONIX) EC tablet 40 mg (has no administration in time range)  potassium chloride SA (KLOR-CON M) CR tablet 20 mEq (has no administration in time range)  pravastatin (PRAVACHOL) tablet 40 mg (has no administration in time range)  albuterol (PROVENTIL) (2.5 MG/3ML) 0.083% nebulizer solution 2.5 mg (has no administration in time range)  ALPRAZolam (XANAX) tablet 1 mg (has no administration in time range)  ezetimibe (ZETIA) tablet 10 mg (has no administration in time range)  sodium chloride 0.9 % bolus 1,000 mL (has no administration in time range)  potassium chloride SA (KLOR-CON M) CR tablet 40 mEq (has no administration in time range)    ED Course/ Medical Decision Making/ A&P Clinical Course as of 02/19/22 1837  Wed Feb 19, 2022  1611 Dementia alzheimer's. Not willing to eat or take meds. Messing on herself without warning. Repeating [CR]    Clinical Course User Index [CR] Wilnette Kales, PA                           Medical Decision Making Amount and/or Complexity of Data Reviewed Labs: ordered.  Risk OTC drugs. Prescription drug management.   This patient  presents to the ED for concern of failure to thrive, this involves an extensive number of treatment options, and is a complaint that carries with it a high risk of complications and morbidity.  The differential diagnosis includes infection, metabolic derangement, CVA, Alzheimer's disease   Co morbidities that complicate the patient evaluation  See HPI   Additional history obtained:  Additional history obtained from EMR External records from outside source obtained and reviewed including hospital records   Lab Tests:  I Ordered, and personally interpreted labs.  The pertinent results include: No leukocytosis.  No evidence anemia.  Platelets within normal range.  Mild hyperkalemia with potassium of 3.2 which was supplemented orally with 40 [place] potassium.  Patient with improving creatinine with a creatinine of 1.24 and GFR 49 BUN of 21 from yesterday's values; likely secondary to patient's dehydrated status due to declining p.o. intake; will give 1 L normal saline.  Patient's magnesium within normal range.   Imaging Studies ordered:  N/a   Cardiac Monitoring: / EKG:  The patient was maintained on a cardiac monitor.  I personally viewed and interpreted the cardiac monitored which showed an underlying rhythm of: Sinus rhythm   Consultations Obtained:  I requested consultation TOC which is pending.   Problem List / ED Course / Critical interventions / Medication management  Dementia  I ordered patient's at home medications Reevaluation of the patient after these medicines showed that the patient stayed the same I have reviewed the patients home medicines and have made adjustments as needed   Social Determinants of Health:  Alzheimer's disease.   Test / Admission - Considered:  Dementia  Vitals signs within normal range and stable throughout visit. Laboratory studies significant for: See above Patient with declining mental/health status to the point where family  cannot take care of her.  Patient's renal function improving from yesterday's visit after liter of fluid administered.  Patient to be given another liter of fluids; AKI likely secondary to patient's dehydration status given refusal of p.o. fluids/foods while at home.  Patient cleared medically yesterday for [facility] consultation the family left due to frustrations about not being communicated with about treatment plan going forward.  Repeat laboratory studies performed again today showed results  as indicated above.  Patient cleared medically for [facility] consultation.  Patient's home medications were continued while in the emergency department.  Family alerted regarding probable TOC consultation in the morning given time of day currently.  Treatment plan discussed at length with patient and family and they acknowledge understanding for agreeable to said plan.           Final Clinical Impression(s) / ED Diagnoses Final diagnoses:  Dementia, unspecified dementia severity, unspecified dementia type, unspecified whether behavioral, psychotic, or mood disturbance or anxiety [facility])    Rx / DC Orders ED Discharge Orders     None         Wilnette Kales, [place] 02/19/22 1858    Elgie Congo, MD 02/20/22 [phone]

## 2022-02-19 NOTE — Discharge Instructions (Signed)
You were evaluated in the Emergency Department and after careful evaluation, we did not find any emergent condition requiring admission or further testing in the hospital.  Your exam/testing today is overall reassuring.  Please return to the Emergency Department if you experience any worsening of your condition.   Thank you for allowing us to be a part of your care. 

## 2022-02-20 DIAGNOSIS — E860 Dehydration: Secondary | ICD-10-CM | POA: Diagnosis not present

## 2022-02-20 MED ORDER — STERILE WATER FOR INJECTION IJ SOLN
INTRAMUSCULAR | Status: AC
  Administered 2022-02-20: 10 mL
  Filled 2022-02-20: qty 10

## 2022-02-20 MED ORDER — ZIPRASIDONE MESYLATE 20 MG IM SOLR
10.0000 mg | Freq: Once | INTRAMUSCULAR | Status: AC
  Administered 2022-02-20: 10 mg via INTRAMUSCULAR
  Filled 2022-02-20: qty 20

## 2022-02-20 NOTE — Progress Notes (Signed)
Pt needs PT order. This CSW requested that RN add EDP so an order can be placed.

## 2022-02-20 NOTE — ED Provider Notes (Signed)
Patient refusing all meds likely 2/2 dementia. Getting more and more agitated and unable to sleep. Nursing spoke with son who authorized IM medications to help with both (see nursing note referencing same). As she has dementia, this is her next of kin (and POA in this case) and do not need IVC for involuntarily administration of medications. Will give geodon.    Julia [name], Julia Cornea, MD 02/20/22 [phone]

## 2022-02-20 NOTE — Progress Notes (Addendum)
Transition of Care [facility]) - Emergency Department Mini Assessment   Patient Details  Name: Julia [name] MRN: [id number] Date of Birth: 10/12/1960  [facility] CM/SW Contact:    Kimber Relic, LCSW Phone Number: 02/20/2022, 11:23 AM   Clinical Narrative: This CSW contacted Randell Patient, who is the pt's daughter and HCPOA. Randell Patient states she is interested in her other being placed into ALF. This CSW informed Charlene that we do not place into ALF from the hospital but could place for STR at SNF and she can work with SNF/DSS to flip the pt's Medicaid to LTC if plan is for pt to remain there long term. Charlene expressed understanding. Randell Patient states that she spke with her family and they only want her placed in [place]. This CSW provided the 4 facilities in [place] and [place] states [place] is not an option and that she's already spoken with [facility] who states they do not have any beds available. This CSW will fax the pt out to [facility] and [facility]. If the pt is offered a bed, we will present bed offers and begin Auth. If the pt is declined at both facilities, Randell Patient and family are prepared to pick pt up and place her into ALF as she's already been in touch with [facility].   Addend @ 11:48 PT reports pt is high-functioning and recommending HH. TOC to set up [facility].   Addend @ 12:19 Charlene notified of PT recommendation. Will come get the pt around 1 PM. TOC to contact [facility].   Addend @ 1:00 PM Notified daughter about [facility] place for [facility] referral.  [facility] referral for [facility] is complete. TOC signing off.   ED Mini Assessment: What brought you to the Emergency Department? : family unable to care for her in home. not eating/taking meds  Barriers to Discharge: No Barriers Identified     Means of departure: Not know       Patient Contact and Communications Key Contact 1: Charlene (daughter/POA)   Spoke with: Annye English Date: 02/20/22,   Contact  time: 1110 Contact Phone Number: [phone] Call outcome: Only wants [facility] if not accepted at [facility] or [place], family wants to bring her home and place into ALF.    CMS Medicare.gov Compare Post Acute Care list provided to:: Patient Represenative (must comment) (Charlene (HCPOA/POA)) Choice offered to / list presented to : Adult Children, [facility] POA / Guardian  Admission diagnosis:  failure to thrive Patient Active Problem List   Diagnosis Date Noted   Hemorrhoids 11/06/2021   Other hyperparathyroidism ([place]) 06/20/2021   Localized osteoporosis without current pathological fracture 06/20/2021   Hypercalcemia 05/30/2021   Abdominal pain 11/05/2020   Nausea & vomiting 11/05/2020   Hypokalemia 11/05/2020   Lactic acidosis 11/05/2020   Leukocytosis 11/05/2020   Hyperglycemia 11/05/2020   Elevated AST (SGOT) 11/05/2020   Hyperlipidemia 11/05/2020   Anxiety 11/05/2020   Seizure ([place]) 11/04/2020   Long-term current use of opiate analgesic 11/04/2020   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   Chronic headache disorder 11/04/2020   Chronic low back pain 11/04/2020   Essential hypertension 11/04/2020   Migraine without aura 11/04/2020   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   Pain radiating to neck 11/04/2020   Neck pain 04/19/2020   Thickened endometrium 06/17/2017   Screening for colorectal cancer 06/09/2017   Encounter for gynecological examination with Papanicolaou smear of cervix 06/09/2017   PMB (postmenopausal bleeding) 06/09/2017   Drug-induced constipation 09/30/2012  Colon cancer screening 09/30/2012   Trichomoniasis of vagina 08/31/2012   Pelvic relaxation due to cystocele, midline 06/02/2012   Rectocele 06/02/2012   PCP:  Redmond School, MD Pharmacy:   [facility] [place] [address] [place] [address] Phone: [phone] Fax: [phone]

## 2022-02-20 NOTE — Evaluation (Signed)
Physical Therapy Evaluation Patient Details Name: Julia [name] MRN: [id number] DOB: 12/23/1960 Today's Date: 02/20/2022  History of Present Illness  Per EMS pts family cannot take care of her as her dementia has gotten worse, they are having a hard time getting pt to take medications and pt has had recent urinary incontinence.   Clinical Impression  Patient functioning near baseline for functional mobility and gait demonstrating good return for bed mobility, transfers and ambulation without loss of balance, occasional scissoring of legs, but able to self correct without requiring hands on assist.  Plan:  Patient discharged from physical therapy to care of nursing for ambulation daily as tolerated for length of stay.         Recommendations for follow up therapy are one component of a multi-disciplinary discharge planning process, led by the attending physician.  Recommendations may be updated based on patient status, additional functional criteria and insurance authorization.  Follow Up Recommendations No PT follow up      Assistance Recommended at Discharge Intermittent Supervision/Assistance  Patient can return home with the following  A little help with walking and/or transfers;A little help with bathing/dressing/bathroom;Help with stairs or ramp for entrance    Equipment Recommendations None recommended by PT  Recommendations for Other Services       Functional Status Assessment Patient has not had a recent decline in their functional status     Precautions / Restrictions Precautions Precautions: Fall Restrictions Weight Bearing Restrictions: No      Mobility  Bed Mobility Overal bed mobility: Modified Independent                  Transfers Overall transfer level: Modified independent                      Ambulation/Gait Ambulation/Gait assistance: Supervision Gait Distance (Feet): 80 Feet Assistive device: None Gait Pattern/deviations:  Decreased step length - right, Decreased step length - left, Decreased stride length, Scissoring Gait velocity: decreased     General Gait Details: slightly labored cadence with occasional scissoring of legs without loss of balance  Stairs            Wheelchair Mobility    Modified Rankin (Stroke Patients Only)       Balance                                             Pertinent Vitals/Pain Pain Assessment Pain Assessment: No/denies pain    Home Living Family/patient expects to be discharged to:: Private residence Living Arrangements: Children Available Help at Discharge: Family;Available PRN/intermittently Type of Home: House Home Access: Stairs to enter Entrance Stairs-Rails: Right Entrance Stairs-Number of Steps: 3   Home Layout: One level Home Equipment: Grab bars - tub/shower      Prior Function Prior Level of Function : Independent/Modified Independent             Mobility Comments: Hydrographic surveyor without AD ADLs Comments: Assisted by family     Hand Dominance        Extremity/Trunk Assessment   Upper Extremity Assessment Upper Extremity Assessment: Overall WFL for tasks assessed    Lower Extremity Assessment Lower Extremity Assessment: Overall WFL for tasks assessed    Cervical / Trunk Assessment Cervical / Trunk Assessment: Normal  Communication   Communication: No difficulties  Cognition Arousal/Alertness: Awake/alert Behavior During Therapy:  WFL for tasks assessed/performed Overall Cognitive Status: History of cognitive impairments - at baseline                                          General Comments      Exercises     Assessment/Plan    PT Assessment Patient does not need any further PT services  PT Problem List         PT Treatment Interventions      PT Goals (Current goals can be found in the Care Plan section)  Acute Rehab PT Goals Patient Stated Goal: return home PT  Goal Formulation: With patient Time For Goal Achievement: 02/20/22 Potential to Achieve Goals: Good    Frequency       Co-evaluation               AM-PAC PT "6 Clicks" Mobility  Outcome Measure Help needed turning from your back to your side while in a flat bed without using bedrails?: None Help needed moving from lying on your back to sitting on the side of a flat bed without using bedrails?: None Help needed moving to and from a bed to a chair (including a wheelchair)?: None Help needed standing up from a chair using your arms (e.g., wheelchair or bedside chair)?: None Help needed to walk in hospital room?: A Little Help needed climbing 3-5 steps with a railing? : A Little 6 Click Score: 22    End of Session   Activity Tolerance: Patient tolerated treatment well Patient left: in bed;with call bell/phone within reach;with family/visitor present Nurse Communication: Mobility status PT Visit Diagnosis: Unsteadiness on feet (R26.81);Other abnormalities of gait and mobility (R26.89);Muscle weakness (generalized) (M62.81)    Time: 3903-0092 PT Time Calculation (min) (ACUTE ONLY): 11 min   Charges:   PT Evaluation $PT Eval Low Complexity: 1 Low PT Treatments $Therapeutic Activity: 8-22 mins        2:48 PM, 02/20/22 Lonell Grandchild, MPT Physical Therapist with [facility] [phone] office [phone] mobile phone

## 2022-02-20 NOTE — ED Notes (Signed)
Spoke with patient's brother Alethia Berthold) regarding route of medication administration. Verbal consent was given by brother to give patient medication via injection. Verbal consent was given to second nurse Vivien Rota) for witness.

## 2022-02-24 ENCOUNTER — Emergency Department (HOSPITAL_COMMUNITY)
Admission: EM | Admit: 2022-02-24 | Discharge: 2022-02-24 | Disposition: A | Discharge status: Home / Self Care | Payer: Medicare HMO | Attending: Emergency Medicine | Admitting: Emergency Medicine

## 2022-02-24 ENCOUNTER — Encounter (HOSPITAL_COMMUNITY): Payer: Self-pay | Admitting: Emergency Medicine

## 2022-02-24 ENCOUNTER — Other Ambulatory Visit: Payer: Self-pay

## 2022-02-24 DIAGNOSIS — I10 Essential (primary) hypertension: Secondary | ICD-10-CM | POA: Diagnosis not present

## 2022-02-24 DIAGNOSIS — Z85841 Personal history of malignant neoplasm of brain: Secondary | ICD-10-CM | POA: Insufficient documentation

## 2022-02-24 DIAGNOSIS — Z1330 Encounter for screening examination for mental health and behavioral disorders, unspecified: Secondary | ICD-10-CM | POA: Insufficient documentation

## 2022-02-24 DIAGNOSIS — Z79899 Other long term (current) drug therapy: Secondary | ICD-10-CM | POA: Insufficient documentation

## 2022-02-24 DIAGNOSIS — R531 Weakness: Secondary | ICD-10-CM | POA: Diagnosis not present

## 2022-02-24 DIAGNOSIS — L89301 Pressure ulcer of unspecified buttock, stage 1: Secondary | ICD-10-CM | POA: Insufficient documentation

## 2022-02-24 DIAGNOSIS — F0390 Unspecified dementia without behavioral disturbance: Secondary | ICD-10-CM | POA: Insufficient documentation

## 2022-02-24 DIAGNOSIS — R5381 Other malaise: Secondary | ICD-10-CM | POA: Diagnosis not present

## 2022-02-24 LAB — COMPREHENSIVE METABOLIC PANEL
ALT: 15 U/L (ref 0–44)
AST: 17 U/L (ref 15–41)
Albumin: 3.5 g/dL (ref 3.5–5.0)
Alkaline Phosphatase: 50 U/L (ref 38–126)
Anion gap: 12 (ref 5–15)
BUN: 15 mg/dL (ref 8–23)
CO2: 22 mmol/L (ref 22–32)
Calcium: 8 mg/dL — ABNORMAL LOW (ref 8.9–10.3)
Chloride: 110 mmol/L (ref 98–111)
Creatinine, Ser: 0.99 mg/dL (ref 0.44–1.00)
GFR, Estimated: >60 mL/min (ref >60)
Glucose, Bld: 68 mg/dL — ABNORMAL LOW (ref 70–99)
Potassium: 3.4 mmol/L — ABNORMAL LOW (ref 3.5–5.1)
Sodium: 144 mmol/L (ref 135–145)
Total Bilirubin: 1.4 mg/dL — ABNORMAL HIGH (ref 0.3–1.2)
Total Protein: 6.1 g/dL — ABNORMAL LOW (ref 6.5–8.1)

## 2022-02-24 LAB — CBC WITH DIFFERENTIAL/PLATELET
Abs Immature Granulocytes: 0.01 10*3/uL (ref 0.00–0.07)
Basophils Absolute: 0 10*3/uL (ref 0.0–0.1)
Basophils Relative: 1 %
Eosinophils Absolute: 0 10*3/uL (ref 0.0–0.5)
Eosinophils Relative: 1 %
HCT: 35.1 % — ABNORMAL LOW (ref 36.0–46.0)
Hemoglobin: 11.9 g/dL — ABNORMAL LOW (ref 12.0–15.0)
Immature Granulocytes: 0 %
Lymphocytes Relative: 31 %
Lymphs Abs: 1.8 10*3/uL (ref 0.7–4.0)
MCH: 30.8 pg (ref 26.0–34.0)
MCHC: 33.9 g/dL (ref 30.0–36.0)
MCV: 90.9 fL (ref 80.0–100.0)
Monocytes Absolute: 0.6 10*3/uL (ref 0.1–1.0)
Monocytes Relative: 10 %
Neutro Abs: 3.3 10*3/uL (ref 1.7–7.7)
Neutrophils Relative %: 57 %
Platelets: 143 10*3/uL — ABNORMAL LOW (ref 150–400)
RBC: 3.86 MIL/uL — ABNORMAL LOW (ref 3.87–5.11)
RDW: 13.9 % (ref 11.5–15.5)
WBC: 5.7 10*3/uL (ref 4.0–10.5)
nRBC: 0 % (ref 0.0–0.2)

## 2022-02-24 MED ORDER — ZINC OXIDE 40 % EX OINT
TOPICAL_OINTMENT | Freq: Once | CUTANEOUS | Status: DC
  Filled 2022-02-24: qty 57

## 2022-02-24 NOTE — ED Triage Notes (Signed)
Pt arrived via [facility] from home with c/o pt family requesting psychiatric evaluation. Per EMS, per family, pt has refused to eat or take her medications since last night. Pt does have a Hx of dementia

## 2022-02-24 NOTE — Discharge Instructions (Addendum)
Continue to apply diaper rash cream to affected area.  Make sure you are having regular changes of your diaper with reapplication of cream.  Make sure you are rotating how you are sitting sweats to avoid developing pressure ulcers.  Recommend follow-up with primary care for reevaluation of your rash/possible beginnings of pressure ulcer.  Contact social work for further coordination of possible placement.  Please not hesitate to return to emergency department for worrisome signs and symptoms we discussed become apparent.

## 2022-02-24 NOTE — ED Provider Notes (Signed)
[facility] EMERGENCY DEPARTMENT Provider Note   CSN: [id number] Arrival date & time: 02/24/22  1112     History  Chief Complaint  Patient presents with   Psychiatric Evaluation    Julia [name] is a 61 y.o. female.  HPI   61 year old female presents emergency department without any direct complaints.  Able to contact daughter who was able to give more thorough history.  Patient has frequented the emergency department for refusal to take at home meds or eat/drink.  Patient's daughter states that she has baseline dementia and this has been a recurring problem.  Was recently seen by social work in the emergency department with no direct ability to place from the emergency department.  Daughter states that social work was suspected to come out to the house today but did not.  They did have a nurse come out to check on patient which noted rash in her buttocks region covered in stool.  Nurse is concerned about possible pressure ulcers and was told to come to the emergency department for further evaluation.  Denies fever, chills, night sweats, chest pain, shortness of breath, abdominal pain, nausea, vomiting, urinary/vaginal symptoms, change in bowel habits.  Past medical history significant for brain tumor, hypertension, osteoporosis, seizures, hypercholesterolemia  Home Medications Prior to Admission medications   Medication Sig Start Date End Date Taking? Authorizing Provider  albuterol (PROVENTIL HFA;VENTOLIN HFA) 108 (90 Base) MCG/ACT inhaler Inhale 1-2 puffs into the lungs every 6 (six) hours as needed for shortness of breath. 09/18/17   [provider]  alendronate (FOSAMAX) 70 MG tablet Take 1 tablet (70 mg total) by mouth every 7 (seven) days. Take with a full glass of water on an empty stomach. 06/20/21   Cassandria Anger, MD  ALPRAZolam Duanne Moron) 1 MG tablet Take 1 mg by mouth 4 (four) times daily.    [provider]  cinacalcet (SENSIPAR) 30 MG tablet Take 1  tablet (30 mg total) by mouth 2 (two) times daily with a meal. 02/13/22   Nida, Marella Chimes, MD  dicyclomine (BENTYL) 20 MG tablet Take 1 tablet (20 mg total) by mouth 2 (two) times daily. 07/10/21   Sharyn Creamer, MD  ezetimibe (ZETIA) 10 MG tablet Take 10 mg by mouth daily.    [provider]  furosemide (LASIX) 20 MG tablet Take 20 mg by mouth daily.    [provider]  levothyroxine (SYNTHROID) 25 MCG tablet Take 25 mcg by mouth daily before breakfast.    [provider]  losartan (COZAAR) 50 MG tablet Take 50 mg by mouth daily.  09/01/13   [provider]  memantine (NAMENDA) 10 MG tablet Take 1 tablet (10 mg total) by mouth 2 (two) times daily. Patient taking differently: Take 10 mg by mouth 2 (two) times daily. Take 10 mg by mouth once daily for 14 days, then take 10 mg twice a day 02/17/22   Penumalli, Earlean Polka, MD  mirtazapine (REMERON SOL-TAB) 30 MG disintegrating tablet Take 30 mg by mouth at bedtime.    [provider]  oxyCODONE-acetaminophen (PERCOCET/ROXICET) 5-325 MG tablet Take 1 tablet by mouth every 6 (six) hours as needed for severe pain. 12/28/21   Milton Ferguson, MD  pantoprazole (PROTONIX) 40 MG tablet Take 40 mg by mouth 2 (two) times daily. 05/02/21   [provider]  potassium chloride SA (KLOR-CON) 20 MEQ tablet Take 20 mEq by mouth daily. 08/02/20   [provider]  pravastatin (PRAVACHOL) 40 MG tablet  Take 40 mg by mouth daily. 06/03/21   [provider]      Allergies    Propranolol, Naprosyn [naproxen], and Penicillins    Review of Systems   Review of Systems  All other systems reviewed and are negative.   Physical Exam Updated Vital Signs BP 134/73 (BP Location: Left Arm)   Pulse 87   Temp 98.7 F (37.1 C) (Oral)   Resp 18   Ht 5' (1.524 m)   Wt 40.8 kg   LMP 09/06/2012   SpO2 100%   BMI 17.57 kg/m  Physical Exam Vitals and nursing note reviewed. Exam conducted with a chaperone  present.  Constitutional:      General: She is not in acute distress.    Appearance: She is well-developed.  HENT:     Head: Normocephalic and atraumatic.  Eyes:     Conjunctiva/sclera: Conjunctivae normal.  Cardiovascular:     Rate and Rhythm: Normal rate and regular rhythm.     Heart sounds: No murmur heard. Pulmonary:     Effort: Pulmonary effort is normal. No respiratory distress.     Breath sounds: Normal breath sounds. No wheezing, rhonchi or rales.  Abdominal:     Palpations: Abdomen is soft.     Tenderness: There is no abdominal tenderness.  Genitourinary:    Comments: Patient with chaperone assistance evaluated presence of sores noted on bottom as indicated pictured above.  Possible stage I pressure ulcer right-sided. Musculoskeletal:        General: No swelling.     Cervical back: Neck supple.  Skin:    General: Skin is warm and dry.     Capillary Refill: Capillary refill takes less than 2 seconds.  Neurological:     Mental Status: She is alert.  Psychiatric:        Mood and Affect: Mood normal.       ED Results / Procedures / Treatments   Labs (all labs ordered are listed, but only abnormal results are displayed) Labs Reviewed  COMPREHENSIVE METABOLIC PANEL - Abnormal; Notable for the following components:      Result Value   Potassium 3.4 (*)    Glucose, Bld 68 (*)    Calcium 8.0 (*)    Total Protein 6.1 (*)    Total Bilirubin 1.4 (*)    All other components within normal limits  CBC WITH DIFFERENTIAL/PLATELET - Abnormal; Notable for the following components:   RBC 3.86 (*)    Hemoglobin 11.9 (*)    HCT 35.1 (*)    Platelets 143 (*)    All other components within normal limits    EKG None  Radiology No results found.  Procedures Procedures    Medications Ordered in ED Medications - No data to display  ED Course/ Medical Decision Making/ A&P                           Medical Decision Making Amount and/or Complexity of Data  Reviewed Labs: ordered.  Risk OTC drugs.   This patient presents to the ED for concern of rash, this involves an extensive number of treatment options, and is a complaint that carries with it a high risk of complications and morbidity.  The differential diagnosis includes pressure ulcer, sepsis, electrolyte abnormality,    Co morbidities that complicate the patient evaluation  See HPI   Additional history obtained:  Additional history obtained from EMR External records from outside source obtained and  reviewed including hospital records   Lab Tests:  I Ordered, and personally interpreted labs.  The pertinent results include: No leukocytosis.  Mild evidence anemia with hemoglobin 11.9 which is decreased from patient's baseline; no evidence of acute bleed today's recommend follow-up with primary care outpatient.  Platelets decreased at 143 8 which is near patient's baseline.  Mild hypokalemia potassium 3.4 but otherwise electrolytes within normal range.  Mild increase in glucose of 68 of which patient has been eating/drinking while in the emergency department.  No transaminitis noted.  Renal function within normal limits.   Imaging Studies ordered:  N/a   Cardiac Monitoring: / EKG:  The patient was maintained on a cardiac monitor.  I personally viewed and interpreted the cardiac monitored which showed an underlying rhythm of: Sinus rhythm   Consultations Obtained:  I requested consultation with the social work who has been managing her case and states that no further benefit of patient with [facility] consultation in the ED given most recent conversation.   Problem List / ED Course / Critical interventions / Medication management  Rash I ordered medication including Desitin for rash   Reevaluation of the patient after these medicines showed that the patient improved I have reviewed the patients home medicines and have made adjustments as needed   Social Determinants of  Health:  Denies tobacco, licit drug use.   Test / Admission - Considered:  Rash Vitals signs =within normal range and stable throughout visit. Laboratory studies significant for: See above Patient has possible stage I pressure ulcer the skin irritation most likely secondary to prolonged fecal exposure to said area.  Patient cleaned in sterile manner and Desitin was placed on affected area by nursing staff.  Talk to patient and left notes on discharge papers regarding proper hygiene of affected area with Desitin reapplication as well as positional changes of patient to avoid breakdown of skin..  Patient seems at baseline cognition from prior visits emergency department.  Again stressed outpatient coordination of assisted living facility given current situation per social work.  Treatment plan discussed length with patient and she acknowledged understanding was agreeable to said plan.  Nursing staff to pass along conversation to family members upon discharge. Worrisome signs and symptoms were discussed with the patient, and the patient acknowledged understanding to return to the ED if noticed. Patient was stable upon discharge.          Final Clinical Impression(s) / ED Diagnoses Final diagnoses:  Dementia, unspecified dementia severity, unspecified dementia type, unspecified whether behavioral, psychotic, or mood disturbance or anxiety ([place])  Pressure injury of buttock, stage 1, unspecified laterality    Rx / DC Orders ED Discharge Orders     None         Wilnette Kales, [place] 02/25/22 1834    Jeanell Sparrow, DO 02/28/22 [phone]

## 2022-02-24 NOTE — ED Notes (Signed)
Pt able to tolerate water well, denies N/V, pt requested water

## 2022-02-24 NOTE — ED Notes (Signed)
Signature pad unavailable for signature

## 2022-02-25 ENCOUNTER — Ambulatory Visit: Payer: Medicare HMO | Admitting: Diagnostic Neuroimaging

## 2022-02-27 DIAGNOSIS — Q8500 Neurofibromatosis, unspecified: Secondary | ICD-10-CM | POA: Diagnosis not present

## 2022-02-27 DIAGNOSIS — I10 Essential (primary) hypertension: Secondary | ICD-10-CM | POA: Diagnosis not present

## 2022-02-27 DIAGNOSIS — E559 Vitamin D deficiency, unspecified: Secondary | ICD-10-CM | POA: Diagnosis not present

## 2022-02-27 DIAGNOSIS — E213 Hyperparathyroidism, unspecified: Secondary | ICD-10-CM | POA: Diagnosis not present

## 2022-02-27 DIAGNOSIS — E039 Hypothyroidism, unspecified: Secondary | ICD-10-CM | POA: Diagnosis not present

## 2022-02-27 DIAGNOSIS — D510 Vitamin B12 deficiency anemia due to intrinsic factor deficiency: Secondary | ICD-10-CM | POA: Diagnosis not present

## 2022-02-27 DIAGNOSIS — G894 Chronic pain syndrome: Secondary | ICD-10-CM | POA: Diagnosis not present

## 2022-02-27 DIAGNOSIS — F0390 Unspecified dementia without behavioral disturbance: Secondary | ICD-10-CM | POA: Diagnosis not present

## 2022-02-27 DIAGNOSIS — E871 Hypo-osmolality and hyponatremia: Secondary | ICD-10-CM | POA: Diagnosis not present

## 2022-02-27 DIAGNOSIS — Z681 Body mass index (BMI) 19 or less, adult: Secondary | ICD-10-CM | POA: Diagnosis not present

## 2022-03-04 ENCOUNTER — Telehealth: Payer: Self-pay

## 2022-03-04 NOTE — Telephone Encounter (Signed)
        Patient  visited [place] on 12/18  Telephone encounter attempt :  1st  A HIPAA compliant voice message was left requesting a return call.  Instructed patient to call back .    [place], Care Management  [phone] [address], [place], [place] [address] Phone: [phone] Email: [email or web address]

## 2022-03-07 DIAGNOSIS — R569 Unspecified convulsions: Secondary | ICD-10-CM | POA: Diagnosis not present

## 2022-03-07 DIAGNOSIS — G309 Alzheimer's disease, unspecified: Secondary | ICD-10-CM | POA: Diagnosis not present

## 2022-03-07 DIAGNOSIS — F0280 Dementia in other diseases classified elsewhere without behavioral disturbance: Secondary | ICD-10-CM | POA: Diagnosis not present

## 2022-03-07 DIAGNOSIS — I10 Essential (primary) hypertension: Secondary | ICD-10-CM | POA: Diagnosis not present

## 2022-03-21 DIAGNOSIS — K648 Other hemorrhoids: Secondary | ICD-10-CM | POA: Diagnosis not present

## 2022-03-21 DIAGNOSIS — Q8500 Neurofibromatosis, unspecified: Secondary | ICD-10-CM | POA: Diagnosis not present

## 2022-03-21 DIAGNOSIS — B356 Tinea cruris: Secondary | ICD-10-CM | POA: Diagnosis not present

## 2022-03-21 DIAGNOSIS — F0280 Dementia in other diseases classified elsewhere without behavioral disturbance: Secondary | ICD-10-CM | POA: Diagnosis not present

## 2022-03-21 DIAGNOSIS — K529 Noninfective gastroenteritis and colitis, unspecified: Secondary | ICD-10-CM | POA: Diagnosis not present

## 2022-03-21 DIAGNOSIS — I700 Atherosclerosis of aorta: Secondary | ICD-10-CM | POA: Diagnosis not present

## 2022-03-21 DIAGNOSIS — E213 Hyperparathyroidism, unspecified: Secondary | ICD-10-CM | POA: Diagnosis not present

## 2022-03-21 DIAGNOSIS — D329 Benign neoplasm of meninges, unspecified: Secondary | ICD-10-CM | POA: Diagnosis not present

## 2022-03-21 DIAGNOSIS — L8990 Pressure ulcer of unspecified site, unspecified stage: Secondary | ICD-10-CM | POA: Diagnosis not present

## 2022-03-21 DIAGNOSIS — R569 Unspecified convulsions: Secondary | ICD-10-CM | POA: Diagnosis not present

## 2022-05-02 ENCOUNTER — Other Ambulatory Visit (HOSPITAL_COMMUNITY)
Admission: RE | Admit: 2022-05-02 | Discharge: 2022-05-02 | Disposition: A | Discharge status: Home / Self Care | Payer: Medicare Other | Source: Other Acute Inpatient Hospital | Attending: Internal Medicine | Admitting: Internal Medicine

## 2022-05-02 DIAGNOSIS — R350 Frequency of micturition: Secondary | ICD-10-CM | POA: Diagnosis not present

## 2022-05-02 DIAGNOSIS — R8290 Unspecified abnormal findings in urine: Secondary | ICD-10-CM | POA: Diagnosis not present

## 2022-05-04 LAB — URINE CULTURE: Culture: <10000 — AB

## 2022-05-08 ENCOUNTER — Encounter: Payer: Self-pay | Admitting: Radiology

## 2022-05-20 ENCOUNTER — Other Ambulatory Visit (HOSPITAL_COMMUNITY)
Admission: RE | Admit: 2022-05-20 | Discharge: 2022-05-20 | Disposition: A | Discharge status: Home / Self Care | Payer: Medicare Other | Source: Other Acute Inpatient Hospital | Attending: Internal Medicine | Admitting: Internal Medicine

## 2022-05-20 DIAGNOSIS — R350 Frequency of micturition: Secondary | ICD-10-CM | POA: Diagnosis present

## 2022-05-20 IMAGING — DX DG CERVICAL SPINE COMPLETE 4+V
6 series · 6 of 6 positions shown · non-contrast
Comparison: None Available.

CLINICAL DATA: Neck pain without known injury.

EXAM:
CERVICAL SPINE - COMPLETE 4+ VIEW

[c-spine lat]
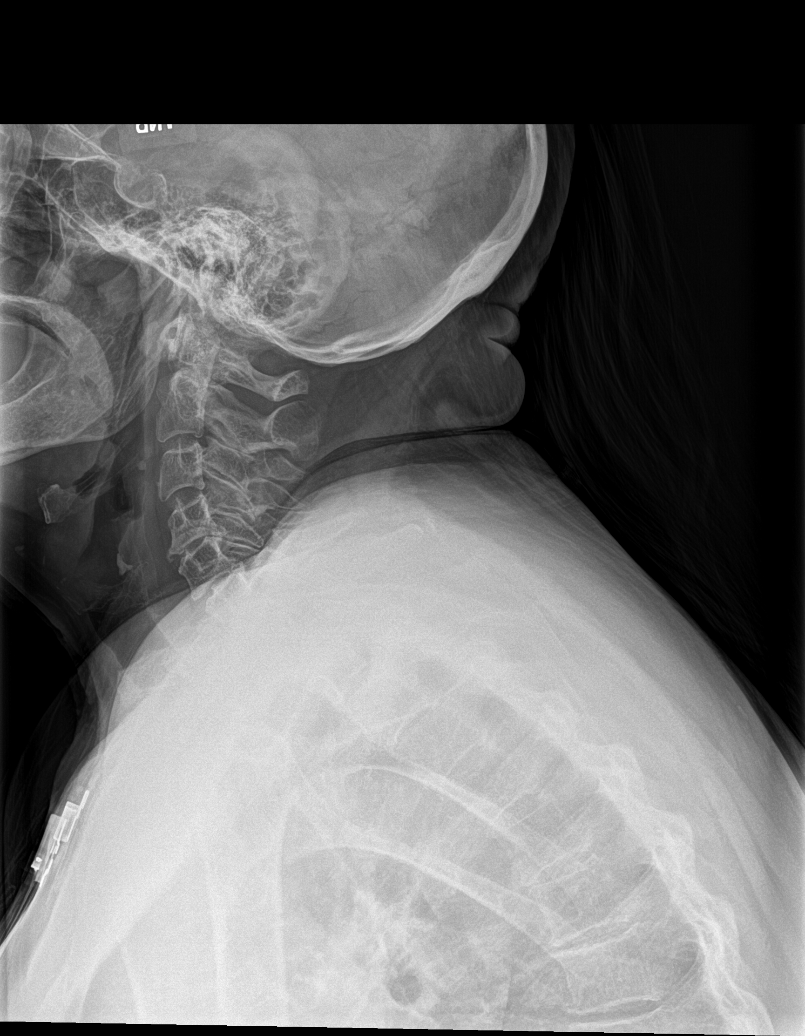

[c-spine obl (1 of 2)]
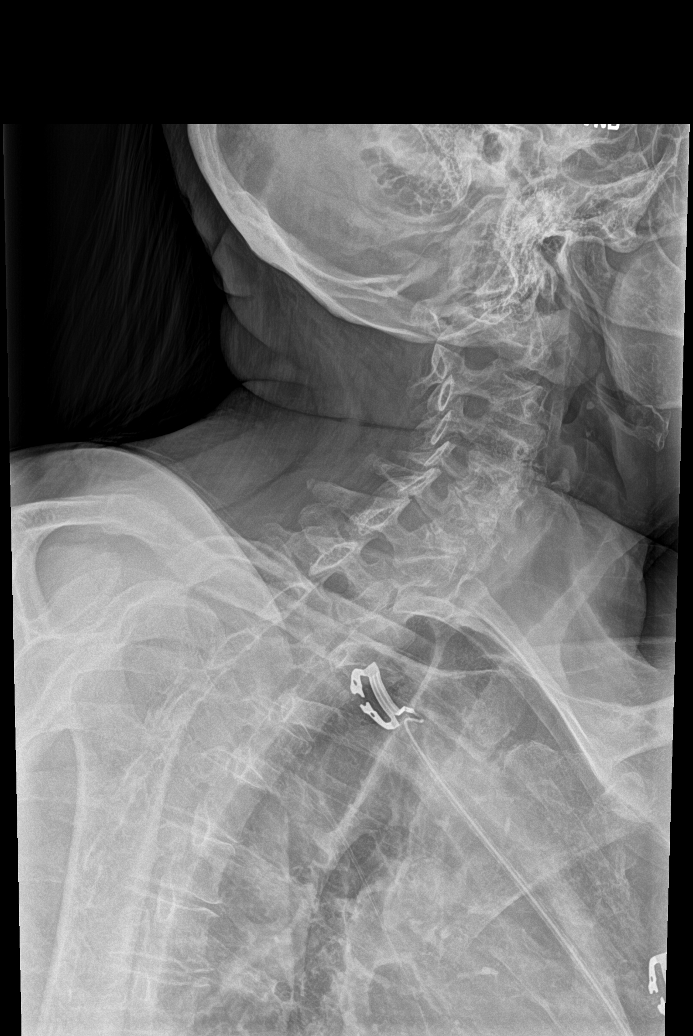

[c-spine obl (2 of 2)]
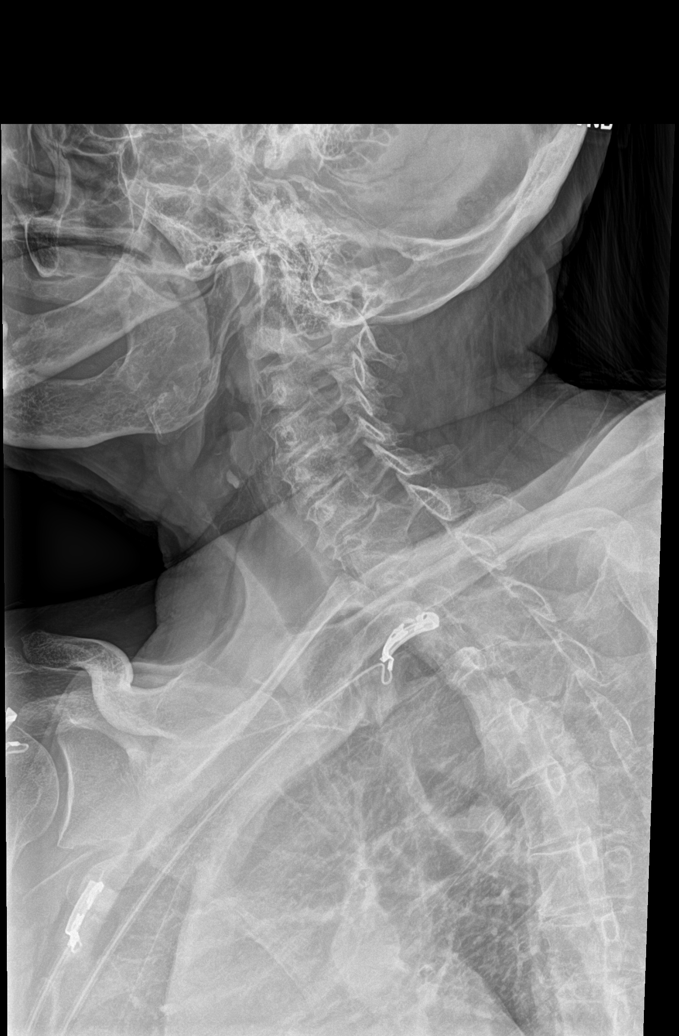

[c-spine ap]
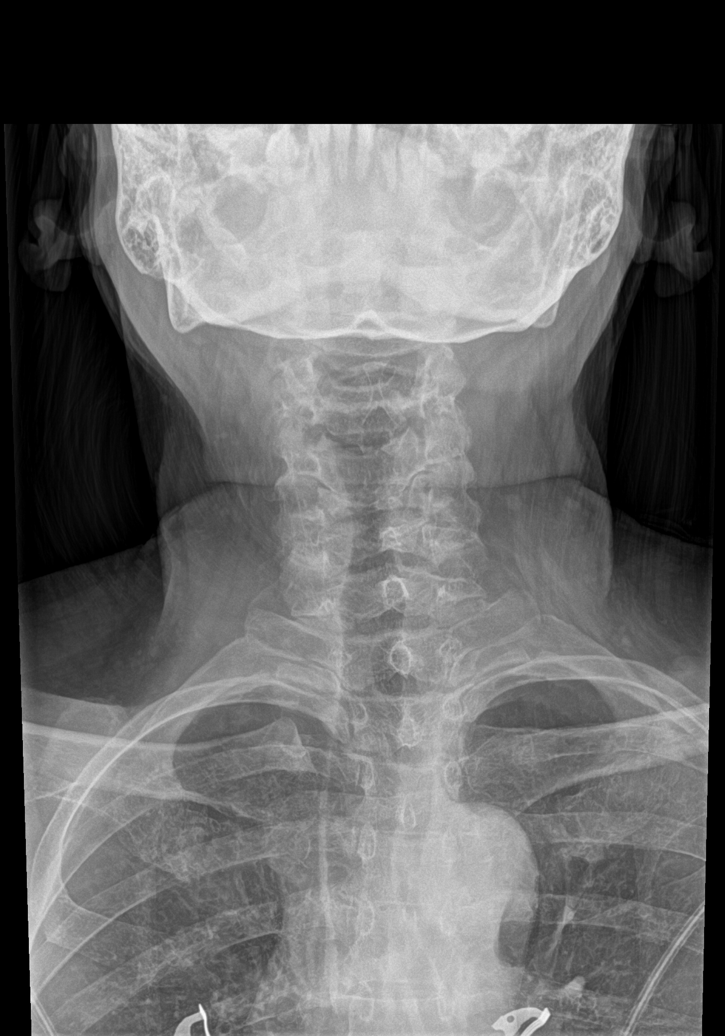

[c-spine open mouth]
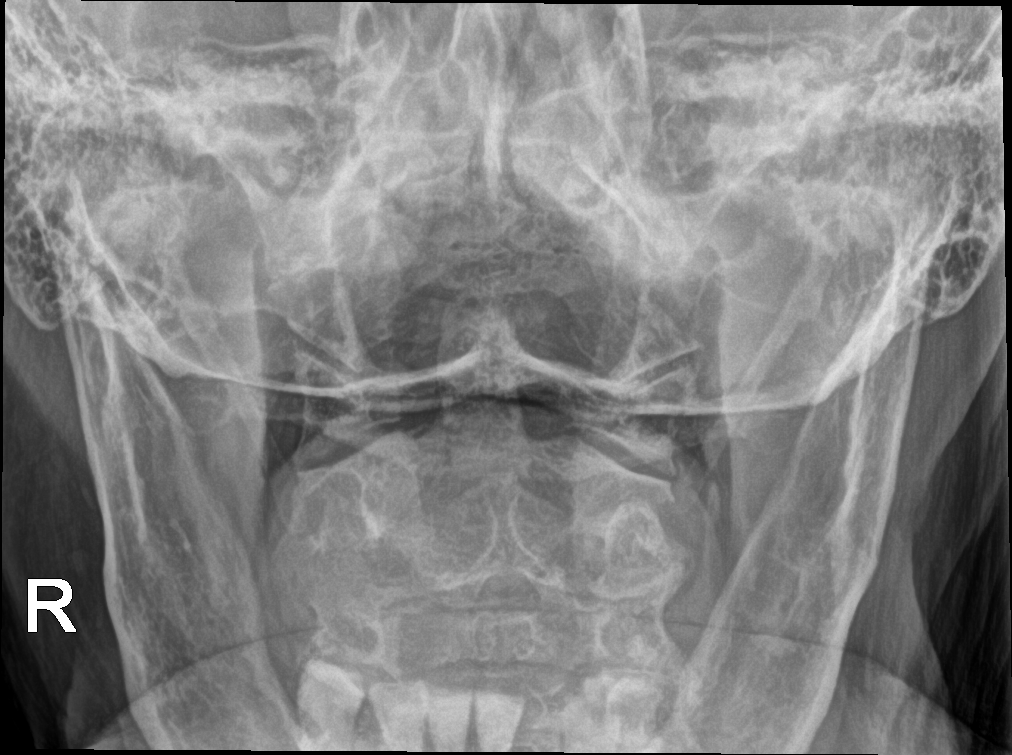

[ct-spine swimmers]
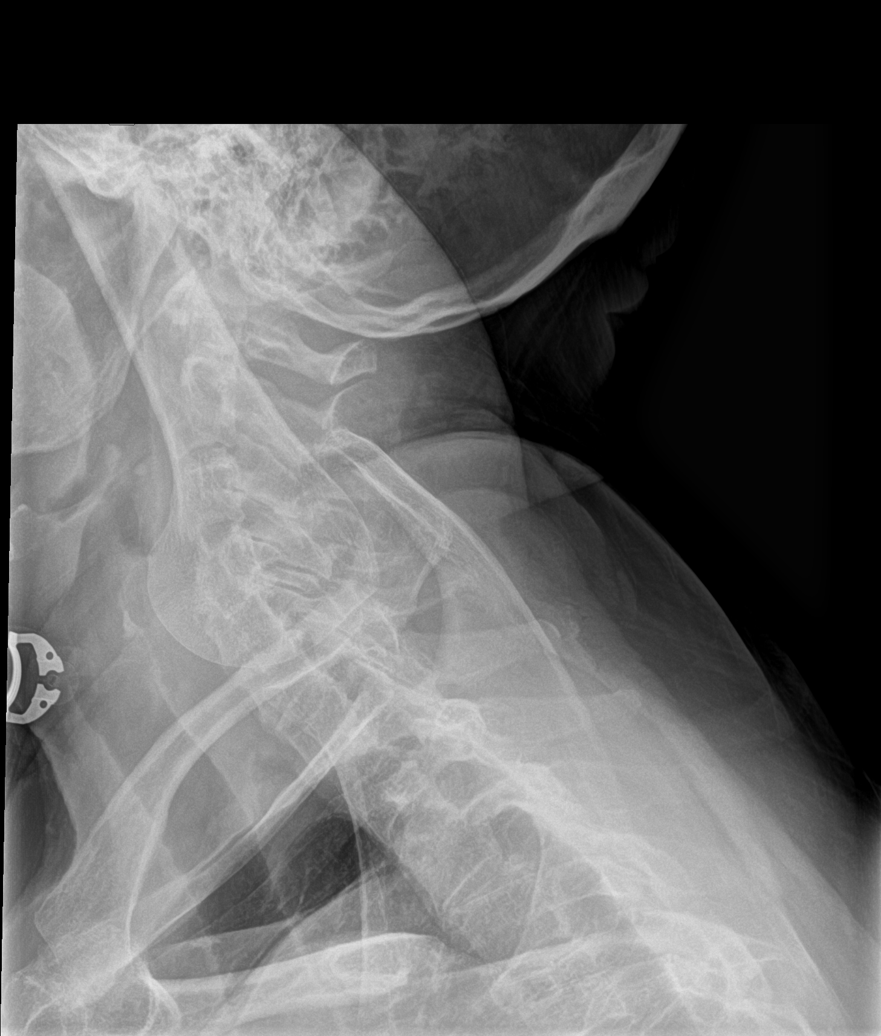

[6 of 6 positions shown; findings below may reference images not displayed]

FINDINGS: Mild grade 1 retrolisthesis of C4-5 is noted secondary to moderate
degenerative disc disease at this level. Moderate degenerative disc
disease is also noted at C5-6 and C6-7. No fracture is noted. No
significant neural foraminal stenosis is noted.
IMPRESSION: Multilevel degenerative disc disease.  No acute abnormality seen.

## 2022-05-21 LAB — URINE CULTURE

## 2022-05-26 DIAGNOSIS — E559 Vitamin D deficiency, unspecified: Secondary | ICD-10-CM | POA: Diagnosis not present

## 2022-05-26 DIAGNOSIS — E213 Hyperparathyroidism, unspecified: Secondary | ICD-10-CM | POA: Diagnosis not present

## 2022-05-26 DIAGNOSIS — G9332 Myalgic encephalomyelitis/chronic fatigue syndrome: Secondary | ICD-10-CM | POA: Diagnosis not present

## 2022-05-26 DIAGNOSIS — R7309 Other abnormal glucose: Secondary | ICD-10-CM | POA: Diagnosis not present

## 2022-05-26 DIAGNOSIS — D649 Anemia, unspecified: Secondary | ICD-10-CM | POA: Diagnosis not present

## 2022-05-26 DIAGNOSIS — I201 Angina pectoris with documented spasm: Secondary | ICD-10-CM | POA: Diagnosis not present

## 2022-05-26 DIAGNOSIS — I700 Atherosclerosis of aorta: Secondary | ICD-10-CM | POA: Diagnosis not present

## 2022-05-26 DIAGNOSIS — D518 Other vitamin B12 deficiency anemias: Secondary | ICD-10-CM | POA: Diagnosis not present

## 2022-05-26 DIAGNOSIS — Z0001 Encounter for general adult medical examination with abnormal findings: Secondary | ICD-10-CM | POA: Diagnosis not present

## 2022-05-26 DIAGNOSIS — E1151 Type 2 diabetes mellitus with diabetic peripheral angiopathy without gangrene: Secondary | ICD-10-CM | POA: Diagnosis not present

## 2022-05-26 DIAGNOSIS — F0280 Dementia in other diseases classified elsewhere without behavioral disturbance: Secondary | ICD-10-CM | POA: Diagnosis not present

## 2022-05-26 DIAGNOSIS — K529 Noninfective gastroenteritis and colitis, unspecified: Secondary | ICD-10-CM | POA: Diagnosis not present

## 2022-06-16 ENCOUNTER — Ambulatory Visit: Payer: Medicare HMO | Admitting: "Endocrinology

## 2022-07-22 DIAGNOSIS — R351 Nocturia: Secondary | ICD-10-CM | POA: Diagnosis not present

## 2022-07-31 NOTE — Progress Notes (Signed)
07/31/2022 ENYA WUEBBEN [id number] 1961-02-23   Chief Complaint: Diarrhea   History of Present Illness: Julia [name] is a 62 year old female with a past medical history of anxiety, hypertension, hypothyroidism, dementia, seizures, left adrenal nodule, gastritis and colon polyps.  She presents today for further evaluation regarding diarrhea.  She is accompanied by her daughter Westley Hummer.  She is known by Dr. Leonides Schanz.  Over the past year, the patient developed progressive dementia and was subsequently diagnosed with Alzheimer's disease.  She is under hospice care with progressive weight loss.  Westley Hummer stated her mother has a lot of diarrhea and wears depends. She can pass numerous nonbloody watery to mushy bowel movements daily then can go 2 weeks without passing a BM. She previously took Lomotil 2-4 times daily without improvement.  She also took MiraLAX when she did not have a bowel movement for many days which was ineffective.  She denies having any nausea, vomiting or abdominal pain.  No bloody or black stools.  She has recurrent UTIs and has been treated with Cipro multiple times over the past 3 to 4 months. She underwent a colonoscopy by Dr. Leonides Schanz 03/19/2021 which identified four  tubular adenomatous polyps removed from the transverse colon and diverticulosis.  No evidence of colitis or IBD.  She has persistent weight loss.  Today's weight 77 pounds down from 89 lbs 02/2022. Westley Hummer stated her mother eats 3 meals a day.  No nausea or vomiting.  No GERD symptoms on pantoprazole 40 mg twice daily.     Latest Ref Rng & Units 02/24/2022    1:08 PM 02/19/2022    4:43 PM 02/18/2022    8:29 PM  CBC  WBC 4.0 - 10.5 K/uL 5.7  6.8  6.4   Hemoglobin 12.0 - 15.0 g/dL 40.9  81.1  91.4   Hematocrit 36.0 - 46.0 % 35.1  40.4  37.9   Platelets 150 - 400 K/uL 143  163  147         Latest Ref Rng & Units 02/24/2022    1:08 PM 02/19/2022    4:43 PM 02/18/2022    8:29 PM  CMP  Glucose 70 -  99 mg/dL 68  99  782   BUN 8 - 23 mg/dL 15  21  26    Creatinine 0.44 - 1.00 mg/dL 9.56  2.13  0.86   Sodium 135 - 145 mmol/L 144  139  140   Potassium 3.5 - 5.1 mmol/L 3.4  3.2  3.2   Chloride 98 - 111 mmol/L 110  105  106   CO2 22 - 32 mmol/L 22  23  24    Calcium 8.9 - 10.3 mg/dL 8.0  9.3  9.7   Total Protein 6.5 - 8.1 g/dL 6.1   6.9   Total Bilirubin 0.3 - 1.2 mg/dL 1.4   1.3   Alkaline Phos 38 - 126 U/L 50   52   AST 15 - 41 U/L 17   19   ALT 0 - 44 U/L 15   15     Gastric empty study 07/17/2021:  FINDINGS: Expected location of the stomach in the left upper quadrant. Ingested meal empties the stomach gradually over the course of the study.   26% emptied at 1 hr ( normal >= 10%)   39% emptied at 2 hr ( normal >= 40%)   55% emptied at 3 hr ( normal >= 70%)   70% emptied at 4 hr (  normal >= 90%)   IMPRESSION: Examination is positive for delayed gastric emptying study with only 70% emptied at 4 hours (normal is greater than 90%).  Called and spoke to the patient as well as his uncle. Her daughter was not available over the telephone. I informed the patient that her recent gastric emptying study showed signs of gastroparesis, which can be caused by a variety of different causes such as medications or a recent infection. I recommended that the patient try to avoid using her oxycodone if possible since this may be causing her delayed gastric emptying. In the meantime I recommended that she eat small meals throughout the daytime. She should try to adhere to low fat diet as well. I will plan to refer her to nutrition to educate her about a gastroparesis diet.   CTAP 12/28/2021: FINDINGS: Lower chest: No acute abnormality.   Hepatobiliary: No significant liver or gallbladder abnormalities are noted. There is no evidence of intrahepatic or extrahepatic biliary dilatation.   Pancreas: Unremarkable   Spleen: Small hypodense lesions within the spleen are unchanged from prior  studies   Adrenals/Urinary Tract: No significant renal abnormalities are noted. There is no evidence of hydronephrosis or urinary calculi.   The adrenal glands and bladder are unremarkable except for a stable 1.5 cm LEFT adrenal adenoma.   Stomach/Bowel: Stomach is within normal limits. Appendix appears normal. No evidence of bowel wall thickening, distention, or inflammatory changes. Colonic diverticulosis identified without evidence of acute diverticulitis.   Vascular/Lymphatic: Aortic atherosclerosis. No enlarged abdominal or pelvic lymph nodes.   Reproductive: Uterus and bilateral adnexa are unremarkable.   Other: No ascites, focal collection or pneumoperitoneum.   Musculoskeletal: No acute or suspicious bony abnormalities are noted. Moderate degenerative disc disease at L4-5 and L5-S1 again noted.   IMPRESSION: 1. No evidence of acute abnormality. 2. Colonic diverticulosis without evidence of acute diverticulitis. 3. LEFT adrenal adenoma. 4.  Aortic Atherosclerosis     PAST GI PROCEDURES:  Colonoscopy 03/19/21: - The examined portion of the ileum was normal. - Diverticulosis in the sigmoid colon, in the descending colon, in the transverse colon, in the ascending colon and in the cecum. - Four 3 to 10 mm polyps in the transverse colon, removed with a cold snare. Resected and retrieved. - Non-bleeding internal hemorrhoids. Path: Surgical [P], colon, transverse, polyp (4) TUBULAR ADENOMAS NEGATIVE FOR HIGH-GRADE DYSPLASIA AND CARCINOMA   EGD 03/19/21: - Normal esophagus. Biopsied. - Gastritis. Biopsied. - A small amount of food (residue) in the stomach. - Normal examined duodenum. Biopsied. Path: 1. Surgical [P], duodenum BENIGN DUODENAL MUCOSA WITH NO DIAGNOSTIC ABNORMALITY 2. Surgical [P], gastric CHRONIC GASTRITIS WITH LYMPHOID AGGREGATE HELICOBACTER STAIN NEGATIVE ([facility], ADEQUATE CONTROL) NEGATIVE FOR INTESTINAL METAPLASIA, DYSPLASIA AND CARCINOMA 3. Surgical  [P], esophagus REACTIVE SQUAMOUS MUCOSA  Current Outpatient Medications on File Prior to Visit  Medication Sig Dispense Refill   ALPRAZolam (XANAX) 1 MG tablet Take 1 mg by mouth 4 (four) times daily.     ciprofloxacin (CIPRO) 500 MG tablet Take 500 mg by mouth 2 (two) times daily.     dicyclomine (BENTYL) 20 MG tablet Take 1 tablet (20 mg total) by mouth 2 (two) times daily. 60 tablet 2   diphenoxylate-atropine (LOMOTIL) 2.5-0.025 MG tablet Take 2 tablets by mouth 4 (four) times daily as needed.     Lactobacillus Rhamnosus, GG, (CULTURELLE) CAPS Take 1 capsule by mouth 2 (two) times daily.     levothyroxine (SYNTHROID) 25 MCG tablet Take 25 mcg by mouth daily  before breakfast.     pantoprazole (PROTONIX) 40 MG tablet Take 40 mg by mouth 2 (two) times daily.     potassium chloride SA (KLOR-CON) 20 MEQ tablet Take 20 mEq by mouth daily.     sertraline (ZOLOFT) 25 MG tablet Take 25 mg by mouth daily.     No current facility-administered medications on file prior to visit.   Allergies  Allergen Reactions   Propranolol    Naprosyn [Naproxen] Nausea Only   Penicillins Nausea And Vomiting and Other (See Comments)    Has patient had a PCN reaction causing immediate rash, facial/tongue/throat swelling, SOB or lightheadedness with hypotension: No Has patient had a PCN reaction causing severe rash involving mucus membranes or skin necrosis: No Has patient had a PCN reaction that required hospitalization Yes Has patient had a PCN reaction occurring within the last 10 years: No If all of the above answers are "NO", then may proceed with Cephalosporin use.    Current Medications, Allergies, Past Medical History, Past Surgical History, Family History and Social History were reviewed in [employer] record.  Review of Systems:   Constitutional: Negative for fever, sweats, chills or weight loss.  Respiratory: Negative for shortness of breath.   Cardiovascular: Negative for  chest pain, palpitations and leg swelling.  Gastrointestinal: See HPI.  Musculoskeletal: Negative for back pain or muscle aches.  Neurological: Negative for dizziness, headaches or paresthesias.   Physical Exam: LMP 09/06/2012  BP 120/78 (BP Location: Left Arm, Patient Position: Sitting, Cuff Size: Normal)   Pulse (!) 108   Ht 5' (1.524 m)   Wt 77 lb 6 oz (35.1 kg)   LMP 09/06/2012   BMI 15.11 kg/m   Wt Readings from Last 3 Encounters:  08/01/22 77 lb 6 oz (35.1 kg)  02/24/22 89 lb 15.2 oz (40.8 kg)  02/19/22 89 lb 15.2 oz (40.8 kg)    General: Frail thin 62 year old female in no acute distress. Head: Normocephalic and atraumatic. Eyes: No scleral icterus. Conjunctiva pink . Ears: Normal auditory acuity. Mouth: Poor dentition. No ulcers or lesions.  Lungs: Clear throughout to auscultation. Heart: Regular rate and rhythm, no murmur. Abdomen: Soft, nontender and nondistended. No masses or hepatomegaly. Normal bowel sounds x 4 quadrants.  Rectal: No external hemorrhoids.  A moderate amount of solid stool in the rectal vault, stool ball was manually broken into smaller pieces without removal secondary to anorectal pain.  Patient verbalized having rectal pain therefore manual stool disimpaction was deferred for now.  DD CMA and daughter present during exam.  Musculoskeletal: Symmetrical with no gross deformities. Extremities: No edema. Neurological: Alert oriented x 4. No focal deficits.  Psychological: Alert and cooperative. Normal mood and affect  Assessment and Recommendations:  62 year old female with constipation, overflow diarrhea -Solid stool in rectum manually broken into smaller pieces to facilitate spontaneous passage at home, manual disimpaction deferred due to anorectal pain -Fleet enema x 1 -Water enema x 2 to 3 if needed -Dulcolax suppository x 1 tonight if no BM after enemas  -Start Miralax po bid after solid stool passes from rectum -Daughter to contact office if  no improvement after the above bowel regimen administered -No Lomotil -Defer C. difficile PCR as patient has a large amount of solid stool in the rectum  History of tubular adenomatous colon polyps per colonoscopy -Recall colon polyp surveillance colonoscopy due 03/2024, to further determine at that time if medically appropriate as the patient is currently under hospice care  GERD, stable on  Pantoprazole 40 mg p.o. twice daily -Consider reducing PPI to daily  Persistent weight loss, Alzheimer's dementia likely contributing factor.  EGD/colonoscopy 03/2021 negative for GI malignancy. -CBC, CMET and TSH -Defer management to PCP and Hospice   Recurrent UTIs, currently on Cipro

## 2022-08-01 ENCOUNTER — Other Ambulatory Visit (INDEPENDENT_AMBULATORY_CARE_PROVIDER_SITE_OTHER): Payer: Medicare HMO

## 2022-08-01 ENCOUNTER — Other Ambulatory Visit: Payer: Self-pay | Admitting: Nurse Practitioner

## 2022-08-01 ENCOUNTER — Ambulatory Visit (INDEPENDENT_AMBULATORY_CARE_PROVIDER_SITE_OTHER): Payer: Medicare Other | Admitting: Nurse Practitioner

## 2022-08-01 ENCOUNTER — Encounter: Payer: Self-pay | Admitting: Nurse Practitioner

## 2022-08-01 VITALS — BP 120/78 | HR 108 | Ht 60.0 in | Wt 77.4 lb

## 2022-08-01 DIAGNOSIS — R197 Diarrhea, unspecified: Secondary | ICD-10-CM

## 2022-08-01 DIAGNOSIS — K5900 Constipation, unspecified: Secondary | ICD-10-CM

## 2022-08-01 DIAGNOSIS — E876 Hypokalemia: Secondary | ICD-10-CM

## 2022-08-01 LAB — CBC WITH DIFFERENTIAL/PLATELET
Basophils Absolute: 0 10*3/uL (ref 0.0–0.1)
Basophils Relative: 0.6 % (ref 0.0–3.0)
Eosinophils Absolute: 0 10*3/uL (ref 0.0–0.7)
Eosinophils Relative: 0.1 % (ref 0.0–5.0)
HCT: 34 % — ABNORMAL LOW (ref 36.0–46.0)
Hemoglobin: 11.6 g/dL — ABNORMAL LOW (ref 12.0–15.0)
Lymphocytes Relative: 27.5 % (ref 12.0–46.0)
Lymphs Abs: 1.2 10*3/uL (ref 0.7–4.0)
MCHC: 34.1 g/dL (ref 30.0–36.0)
MCV: 87.7 fl (ref 78.0–100.0)
Monocytes Absolute: 0.3 10*3/uL (ref 0.1–1.0)
Monocytes Relative: 6.5 % (ref 3.0–12.0)
Neutro Abs: 2.9 10*3/uL (ref 1.4–7.7)
Neutrophils Relative %: 65.3 % (ref 43.0–77.0)
Platelets: 164 10*3/uL (ref 150.0–400.0)
RBC: 3.87 Mil/uL (ref 3.87–5.11)
RDW: 13.6 % (ref 11.5–15.5)
WBC: 4.4 10*3/uL (ref 4.0–10.5)

## 2022-08-01 LAB — TSH: TSH: 1.02 u[IU]/mL (ref 0.35–5.50)

## 2022-08-01 LAB — COMPREHENSIVE METABOLIC PANEL
ALT: 6 U/L (ref 0–35)
AST: 12 U/L (ref 0–37)
Albumin: 4 g/dL (ref 3.5–5.2)
Alkaline Phosphatase: 64 U/L (ref 39–117)
BUN: 15 mg/dL (ref 6–23)
CO2: 35 mEq/L — ABNORMAL HIGH (ref 19–32)
Calcium: 11 mg/dL — ABNORMAL HIGH (ref 8.4–10.5)
Chloride: 97 mEq/L (ref 96–112)
Creatinine, Ser: 0.88 mg/dL (ref 0.40–1.20)
GFR: 70.22 mL/min (ref >60.00)
Glucose, Bld: 125 mg/dL — ABNORMAL HIGH (ref 70–99)
Potassium: 2.2 mEq/L — CL (ref 3.5–5.1)
Sodium: 142 mEq/L (ref 135–145)
Total Bilirubin: 1.2 mg/dL (ref 0.2–1.2)
Total Protein: 6.5 g/dL (ref 6.0–8.3)

## 2022-08-01 MED ORDER — POTASSIUM CHLORIDE CRYS ER 20 MEQ PO TBCR: 20.0000 meq | EXTENDED_RELEASE_TABLET | Freq: Two times a day (BID) | ORAL | 0 refills | Status: DC

## 2022-08-01 NOTE — Patient Instructions (Addendum)
Your provider has requested that you go to the basement level for lab work before leaving today. Press "B" on the elevator. The lab is located at the first door on the left as you exit the elevator.  Use fleet enema once today.  If no bowel movement, then use a water enema twice.  Dulcolax suppository (over the counter)- use 1 tonight  After solid stool passed, take Miralax twice daily.  Due to recent changes in healthcare laws, you may see the results of your imaging and laboratory studies on MyChart before your provider has had a chance to review them.  We understand that in some cases there may be results that are confusing or concerning to you. Not all laboratory results come back in the same time frame and the provider may be waiting for multiple results in order to interpret others.  Please give [place] 48 hours in order for your provider to thoroughly review all the results before contacting the office for clarification of your results.    Thank you for trusting me with your gastrointestinal care!   Alcide Evener, CRNP

## 2022-08-01 NOTE — Progress Notes (Signed)
I agree with the assessment and plan as outlined by Julia [name]. 

## 2022-08-08 ENCOUNTER — Other Ambulatory Visit (INDEPENDENT_AMBULATORY_CARE_PROVIDER_SITE_OTHER)

## 2022-08-08 ENCOUNTER — Telehealth: Payer: Self-pay

## 2022-08-08 DIAGNOSIS — E876 Hypokalemia: Secondary | ICD-10-CM | POA: Diagnosis not present

## 2022-08-08 LAB — BASIC METABOLIC PANEL
BUN: 13 mg/dL (ref 6–23)
CO2: 35 mEq/L — ABNORMAL HIGH (ref 19–32)
Calcium: 10.6 mg/dL — ABNORMAL HIGH (ref 8.4–10.5)
Chloride: 97 mEq/L (ref 96–112)
Creatinine, Ser: 0.84 mg/dL (ref 0.40–1.20)
GFR: 74.24 mL/min (ref >60.00)
Glucose, Bld: 141 mg/dL — ABNORMAL HIGH (ref 70–99)
Potassium: 2.1 mEq/L — CL (ref 3.5–5.1)
Sodium: 141 mEq/L (ref 135–145)

## 2022-08-08 NOTE — Telephone Encounter (Signed)
Labs also have been faxed to PCP.

## 2022-08-08 NOTE — Telephone Encounter (Signed)
Notified by PJ, CMA that lab called with a critical lab for this patient. Potassium 2.1. Will make Jill Side, NP aware.

## 2022-08-08 NOTE — Telephone Encounter (Signed)
Called & spoke with Abby from PCP office. She said Dr. Sherwood Gambler ordered potassium tablets back in March to be taken TID, however patient has only been taking it BID per daughter. They have advised daughter to increase it back to TID, come for appointment on Wednesday to see MD & check labs. PCP has also asked patient to stop her HCTZ.

## 2022-08-08 NOTE — Telephone Encounter (Signed)
Per Jill Side, NP "Patient needs to contact her PCP today to manage her significant hypokalemia." Patient's daughter notified & she is going to contact PCP now.

## 2022-08-08 NOTE — Telephone Encounter (Signed)
Patient's daughter is calling states the PCP told them that they would need to speak to [place] to figure out a solution, not them. Phone got disconnected and went to VM when I tried to call back. Please advise

## 2022-08-08 NOTE — Telephone Encounter (Signed)
Left message for patient to call back  

## 2022-08-08 NOTE — Telephone Encounter (Signed)
Excellent update, agree with PCP's management plan.

## 2022-08-12 ENCOUNTER — Encounter: Payer: Self-pay | Admitting: Nutrition

## 2022-08-13 DIAGNOSIS — E213 Hyperparathyroidism, unspecified: Secondary | ICD-10-CM | POA: Diagnosis not present

## 2022-08-13 DIAGNOSIS — E876 Hypokalemia: Secondary | ICD-10-CM | POA: Diagnosis not present

## 2022-08-13 DIAGNOSIS — D519 Vitamin B12 deficiency anemia, unspecified: Secondary | ICD-10-CM | POA: Diagnosis not present

## 2022-08-13 DIAGNOSIS — Z681 Body mass index (BMI) 19 or less, adult: Secondary | ICD-10-CM | POA: Diagnosis not present

## 2022-08-21 DIAGNOSIS — N390 Urinary tract infection, site not specified: Secondary | ICD-10-CM | POA: Diagnosis not present

## 2022-10-08 ENCOUNTER — Other Ambulatory Visit (HOSPITAL_COMMUNITY): Payer: Self-pay | Admitting: Internal Medicine

## 2022-10-08 DIAGNOSIS — R634 Abnormal weight loss: Secondary | ICD-10-CM | POA: Diagnosis not present

## 2022-10-08 DIAGNOSIS — R6881 Early satiety: Secondary | ICD-10-CM

## 2022-10-08 DIAGNOSIS — Z681 Body mass index (BMI) 19 or less, adult: Secondary | ICD-10-CM | POA: Diagnosis not present

## 2022-10-08 DIAGNOSIS — D519 Vitamin B12 deficiency anemia, unspecified: Secondary | ICD-10-CM | POA: Diagnosis not present

## 2022-10-08 DIAGNOSIS — E213 Hyperparathyroidism, unspecified: Secondary | ICD-10-CM | POA: Diagnosis not present

## 2022-10-08 DIAGNOSIS — Q8500 Neurofibromatosis, unspecified: Secondary | ICD-10-CM | POA: Diagnosis not present

## 2022-10-08 DIAGNOSIS — R7309 Other abnormal glucose: Secondary | ICD-10-CM | POA: Diagnosis not present

## 2022-10-08 DIAGNOSIS — D649 Anemia, unspecified: Secondary | ICD-10-CM | POA: Diagnosis not present

## 2022-10-14 ENCOUNTER — Ambulatory Visit: Payer: Medicare HMO | Admitting: "Endocrinology

## 2022-10-14 ENCOUNTER — Other Ambulatory Visit: Payer: Self-pay

## 2022-10-16 ENCOUNTER — Ambulatory Visit (HOSPITAL_COMMUNITY)
Admission: RE | Admit: 2022-10-16 | Discharge: 2022-10-16 | Disposition: A | Discharge status: Home / Self Care | Payer: Medicare Other | Source: Ambulatory Visit | Attending: Internal Medicine | Admitting: Internal Medicine

## 2022-10-16 DIAGNOSIS — I2510 Atherosclerotic heart disease of native coronary artery without angina pectoris: Secondary | ICD-10-CM | POA: Insufficient documentation

## 2022-10-16 DIAGNOSIS — R932 Abnormal findings on diagnostic imaging of liver and biliary tract: Secondary | ICD-10-CM | POA: Diagnosis not present

## 2022-10-16 DIAGNOSIS — R634 Abnormal weight loss: Secondary | ICD-10-CM | POA: Diagnosis not present

## 2022-10-16 DIAGNOSIS — I700 Atherosclerosis of aorta: Secondary | ICD-10-CM | POA: Diagnosis not present

## 2022-10-16 DIAGNOSIS — R6881 Early satiety: Secondary | ICD-10-CM | POA: Diagnosis not present

## 2022-10-16 DIAGNOSIS — F0390 Unspecified dementia without behavioral disturbance: Secondary | ICD-10-CM | POA: Diagnosis not present

## 2022-10-16 DIAGNOSIS — K769 Liver disease, unspecified: Secondary | ICD-10-CM | POA: Diagnosis not present

## 2022-10-16 MED ORDER — IOHEXOL 300 MG/ML  SOLN
75.0000 mL | Freq: Once | INTRAMUSCULAR | Status: AC | PRN
  Administered 2022-10-16: 75 mL via INTRAVENOUS

## 2022-10-16 MED ORDER — IOHEXOL 9 MG/ML PO SOLN
ORAL | Status: AC
  Filled 2022-10-16: qty 500

## 2022-10-28 ENCOUNTER — Ambulatory Visit (INDEPENDENT_AMBULATORY_CARE_PROVIDER_SITE_OTHER): Admitting: "Endocrinology

## 2022-10-28 ENCOUNTER — Encounter: Payer: Self-pay | Admitting: "Endocrinology

## 2022-10-28 DIAGNOSIS — M816 Localized osteoporosis [Lequesne]: Secondary | ICD-10-CM | POA: Diagnosis not present

## 2022-10-28 DIAGNOSIS — E039 Hypothyroidism, unspecified: Secondary | ICD-10-CM

## 2022-10-28 DIAGNOSIS — E212 Other hyperparathyroidism: Secondary | ICD-10-CM

## 2022-10-28 MED ORDER — CINACALCET HCL 30 MG PO TABS: 30.0000 mg | ORAL_TABLET | Freq: Every day | ORAL | 1 refills | Status: DC

## 2022-10-28 NOTE — Progress Notes (Signed)
10/28/2022, 8:51 AM   Endocrinology follow-up note  Julia [name] is a 62 y.o.-year-old female, referred by her  Elfredia Nevins, MD  , for evaluation for hypercalcemia/hyperparathyroidism.  She is returning to discuss her recent labs and bone density. Past Medical History:  Diagnosis Date   Alzheimer disease (HCC)    Anxiety    Brain tumor (benign) (HCC)    Chronic pain    Dementia (HCC)    Depression    Elevated cholesterol    Headache(784.0)    Hypertension    Numerous moles    Osteoporosis    Seizures (HCC)    Trichimoniasis     Past Surgical History:  Procedure Laterality Date   EXCISION OF SKIN TAG N/A 09/22/2017   Procedure: EXCISION OF SKIN TAGS ON FACE AND NECK (Procedure #2);  Surgeon: Tilda Burrow, MD;  Location: [facility];  Service: Gynecology;  Laterality: N/A;   HYSTEROSCOPY WITH D & C N/A 09/22/2017   Procedure: DILATATION AND CURETTAGE /HYSTEROSCOPY; EXCISION OF VULVAR AND RIGHT AND LEFT THIGH SKIN TAGS (Procedure #1);  Surgeon: Tilda Burrow, MD;  Location: [facility];  Service: Gynecology;  Laterality: N/A;   POLYPECTOMY N/A 09/22/2017   Procedure: REMOVAL OF ENDOMETRIAL POLYP (Procedure #1);  Surgeon: Tilda Burrow, MD;  Location: [facility];  Service: Gynecology;  Laterality: N/A;   TUBAL LIGATION      Social History   Tobacco Use   Smoking status: Never   Smokeless tobacco: Never  Vaping Use   Vaping status: Never Used  Substance Use Topics   Alcohol use: No   Drug use: No    Family History  Problem Relation Age of Onset   Cancer Father        lung   Stroke Mother    Heart failure Mother    Cancer Brother    Diabetes Brother     Outpatient Encounter Medications as of 10/28/2022  Medication Sig   ALPRAZolam (XANAX) 1 MG tablet Take 1 mg by mouth 4 (four) times daily.   ciprofloxacin (CIPRO) 500 MG tablet Take 500 mg by mouth 2 (two) times daily. (Patient not taking: Reported on  10/28/2022)   dicyclomine (BENTYL) 20 MG tablet Take 1 tablet (20 mg total) by mouth 2 (two) times daily.   diphenoxylate-atropine (LOMOTIL) 2.5-0.025 MG tablet Take 2 tablets by mouth 4 (four) times daily as needed.   Lactobacillus Rhamnosus, GG, (CULTURELLE) CAPS Take 1 capsule by mouth 2 (two) times daily. (Patient not taking: Reported on 10/28/2022)   levothyroxine (SYNTHROID) 25 MCG tablet Take 25 mcg by mouth daily before breakfast.   pantoprazole (PROTONIX) 40 MG tablet Take 40 mg by mouth 2 (two) times daily. (Patient not taking: Reported on 10/28/2022)   potassium chloride SA (KLOR-CON M) 20 MEQ tablet Take 1 tablet (20 mEq total) by mouth 2 (two) times daily for 7 days.   potassium chloride SA (KLOR-CON) 20 MEQ tablet Take 20 mEq by mouth daily.   sertraline (ZOLOFT) 25 MG tablet Take 25 mg by mouth daily.   No facility-administered encounter medications on file as of 10/28/2022.    Allergies  Allergen Reactions   Propranolol  Naprosyn [Naproxen] Nausea Only   Penicillins Nausea And Vomiting and Other (See Comments)    Has patient had a PCN reaction causing immediate rash, facial/tongue/throat swelling, SOB or lightheadedness with hypotension: No Has patient had a PCN reaction causing severe rash involving mucus membranes or skin necrosis: No Has patient had a PCN reaction that required hospitalization Yes Has patient had a PCN reaction occurring within the last 10 years: No If all of the above answers are "NO", then may proceed with Cephalosporin use.      HPI  Julia [name] was diagnosed with hypercalcemia since 2022.  After appropriate workup indicating possibility of primary hyperparathyroidism, coupled with the fact that she did not want to pursue surgery, she was started on low-dose Sensipar during her last visit.  This was in December 2023.  She has responded to Sensipar, however in the interval hospice care discontinued her medications including Sensipar.   She  presents now with increasing calcium to 10.9.  She did have hypercalcemia as high as 12 associated with high PTH in the past.  This was complicated by osteoporosis for which she was also started on alendronate 70 mg weekly which she is no longer taking.  She is interested to get treated for hypercalcemia, accompanied by her daughter to clinic.   No prior history of fragility fractures or falls. No history of  kidney stones. She complains of abdominal pain, GERD. No history of CKD.  she is not on HCTZ or other thiazide therapy.  No history of  vitamin D deficiency.   she is not on calcium supplements.  Her consumption of green leafy vegetables is suboptimal.  she does not have a family history of hypercalcemia, pituitary tumors, thyroid cancer, or osteoporosis.   I reviewed her chart and she also has a history of hyperlipidemia, hypertension on treatment..    ROS:  Constitutional: no weight gain/loss, no fatigue, no subjective hyperthermia, no subjective hypothermia   PE: BP 116/74   Pulse 60   Ht 5' (1.524 m)   Wt 75 lb 12.8 oz (34.4 kg)   LMP 09/06/2012   BMI 14.80 kg/m , Body mass index is 14.8 kg/m. Wt Readings from Last 3 Encounters:  10/28/22 75 lb 12.8 oz (34.4 kg)  08/01/22 77 lb 6 oz (35.1 kg)  02/24/22 89 lb 15.2 oz (40.8 kg)    Constitutional: + BMI 24.7 , + dysphoric and temperament, not in acute distress, normal state of mind Eyes: PERRLA, EOMI, no exophthalmos ENT: moist mucous membranes, no gross thyromegaly, no gross cervical lymphadenopathy    CMP ( most recent) CMP     Component Value Date/Time   NA 141 08/08/2022 1115   K 2.1 (LL) 08/08/2022 1115   CL 97 08/08/2022 1115   CO2 35 (H) 08/08/2022 1115   GLUCOSE 141 (H) 08/08/2022 1115   BUN 13 08/08/2022 1115   CREATININE 0.84 08/08/2022 1115   CALCIUM 10.9 (H) 10/14/2022 1210   PROT 6.5 08/01/2022 1151   ALBUMIN 4.0 08/01/2022 1151   AST 12 08/01/2022 1151   ALT 6 08/01/2022 1151   ALKPHOS 64  08/01/2022 1151   BILITOT 1.2 08/01/2022 1151   GFRNONAA >60 02/24/2022 1308   GFRAA >60 09/17/2017 1323      Assessment: 1. Hypercalcemia / Hyperparathyroidism 2.  Osteoporosis 3.  Hypothyroidism  Plan: -She presents with relapsing hypercalcemia with treatment with Sensipar was withdrawn as part of her hospice care.  Patient would like to control her hypercalcemia. She is  not a surgical candidate.  She will benefit from low-dose Sensipar to avoid catastrophic hypercalcemia.  In the interval, she was initiated on low-dose levothyroxine for hypothyroidism.  I discussed and prescribed Sensipar 30 mg p.o. daily at breakfast.  She was also taken off of her alendronate she was taking for osteoporosis. I advised her to continue levothyroxine 25 mcg p.o. daily before breakfast.  She is advised to maintain close follow-up with her PMD.   I spent  21  minutes in the care of the patient today including review of labs from Thyroid Function, CMP, and other relevant labs ; imaging/biopsy records (current and previous including abstractions from other facilities); face-to-face time discussing  her lab results and symptoms, medications doses, her options of short and long term treatment based on the latest standards of care / guidelines;   and documenting the encounter.  Tilda Burrow  participated in the discussions, expressed understanding, and voiced agreement with the above plans.  All questions were answered to her satisfaction. she is encouraged to contact clinic should she have any questions or concerns prior to her return visit.   She will return with repeat labs in 4 months.   Marquis Lunch, MD [facility] [address] [place], [place] [address] Phone: [phone]  Fax: [phone]    This note was partially dictated with voice recognition software. Similar sounding words can be transcribed inadequately or may not  be corrected  upon review.  10/28/2022, 8:51 AM

## 2022-10-30 DIAGNOSIS — G9332 Myalgic encephalomyelitis/chronic fatigue syndrome: Secondary | ICD-10-CM | POA: Diagnosis not present

## 2022-10-30 DIAGNOSIS — R634 Abnormal weight loss: Secondary | ICD-10-CM | POA: Diagnosis not present

## 2022-10-30 DIAGNOSIS — I700 Atherosclerosis of aorta: Secondary | ICD-10-CM | POA: Diagnosis not present

## 2022-10-30 DIAGNOSIS — K529 Noninfective gastroenteritis and colitis, unspecified: Secondary | ICD-10-CM | POA: Diagnosis not present

## 2022-10-30 DIAGNOSIS — E1151 Type 2 diabetes mellitus with diabetic peripheral angiopathy without gangrene: Secondary | ICD-10-CM | POA: Diagnosis not present

## 2022-10-30 DIAGNOSIS — E213 Hyperparathyroidism, unspecified: Secondary | ICD-10-CM | POA: Diagnosis not present

## 2022-10-30 DIAGNOSIS — F0280 Dementia in other diseases classified elsewhere without behavioral disturbance: Secondary | ICD-10-CM | POA: Diagnosis not present

## 2022-10-30 DIAGNOSIS — T50905A Adverse effect of unspecified drugs, medicaments and biological substances, initial encounter: Secondary | ICD-10-CM | POA: Diagnosis not present

## 2022-10-30 DIAGNOSIS — I10 Essential (primary) hypertension: Secondary | ICD-10-CM | POA: Diagnosis not present

## 2023-02-23 DIAGNOSIS — E039 Hypothyroidism, unspecified: Secondary | ICD-10-CM | POA: Diagnosis not present

## 2023-02-24 LAB — PHOSPHORUS: Phosphorus: 4 mg/dL (ref 3.0–4.3)

## 2023-02-24 LAB — TSH: TSH: 1.1 u[IU]/mL (ref 0.450–4.500)

## 2023-02-24 LAB — COMPREHENSIVE METABOLIC PANEL
ALT: 12 [IU]/L (ref 0–32)
AST: 17 [IU]/L (ref 0–40)
Albumin: 4.3 g/dL (ref 3.9–4.9)
Alkaline Phosphatase: 121 [IU]/L (ref 44–121)
BUN/Creatinine Ratio: 21 (ref 12–28)
BUN: 17 mg/dL (ref 8–27)
Bilirubin Total: 0.7 mg/dL (ref 0.0–1.2)
CO2: 27 mmol/L (ref 20–29)
Calcium: 9.6 mg/dL (ref 8.7–10.3)
Chloride: 103 mmol/L (ref 96–106)
Creatinine, Ser: 0.81 mg/dL (ref 0.57–1.00)
Globulin, Total: 2.1 g/dL (ref 1.5–4.5)
Glucose: 73 mg/dL (ref 70–99)
Potassium: 3.9 mmol/L (ref 3.5–5.2)
Sodium: 144 mmol/L (ref 134–144)
Total Protein: 6.4 g/dL (ref 6.0–8.5)
eGFR: 82 mL/min/{1.73_m2} (ref >59)

## 2023-02-24 LAB — T4, FREE: Free T4: 1.23 ng/dL (ref 0.82–1.77)

## 2023-02-24 LAB — MAGNESIUM: Magnesium: 1.8 mg/dL (ref 1.6–2.3)

## 2023-02-24 LAB — PTH, INTACT AND CALCIUM: PTH: 36 pg/mL (ref 15–65)

## 2023-02-26 ENCOUNTER — Ambulatory Visit (INDEPENDENT_AMBULATORY_CARE_PROVIDER_SITE_OTHER): Payer: Medicare HMO | Admitting: "Endocrinology

## 2023-02-26 ENCOUNTER — Encounter: Payer: Self-pay | Admitting: "Endocrinology

## 2023-02-26 DIAGNOSIS — E039 Hypothyroidism, unspecified: Secondary | ICD-10-CM | POA: Diagnosis not present

## 2023-02-26 DIAGNOSIS — E212 Other hyperparathyroidism: Secondary | ICD-10-CM

## 2023-02-26 NOTE — Progress Notes (Signed)
02/26/2023, 9:54 AM   Endocrinology follow-up note  Julia [name] is a 62 y.o.-year-old female, referred by her  Julia Nevins, MD  , for evaluation for hypercalcemia/hyperparathyroidism.  She is returning to discuss her recent labs .  Past Medical History:  Diagnosis Date   Alzheimer disease (HCC)    Anxiety    Brain tumor (benign) (HCC)    Chronic pain    Dementia (HCC)    Depression    Elevated cholesterol    Headache(784.0)    Hypertension    Numerous moles    Osteoporosis    Seizures (HCC)    Trichimoniasis     Past Surgical History:  Procedure Laterality Date   EXCISION OF SKIN TAG N/A 09/22/2017   Procedure: EXCISION OF SKIN TAGS ON FACE AND NECK (Procedure #2);  Surgeon: Tilda Burrow, MD;  Location: [facility];  Service: Gynecology;  Laterality: N/A;   HYSTEROSCOPY WITH D & C N/A 09/22/2017   Procedure: DILATATION AND CURETTAGE /HYSTEROSCOPY; EXCISION OF VULVAR AND RIGHT AND LEFT THIGH SKIN TAGS (Procedure #1);  Surgeon: Tilda Burrow, MD;  Location: [facility];  Service: Gynecology;  Laterality: N/A;   POLYPECTOMY N/A 09/22/2017   Procedure: REMOVAL OF ENDOMETRIAL POLYP (Procedure #1);  Surgeon: Tilda Burrow, MD;  Location: [facility];  Service: Gynecology;  Laterality: N/A;   TUBAL LIGATION      Social History   Tobacco Use   Smoking status: Never   Smokeless tobacco: Never  Vaping Use   Vaping status: Never Used  Substance Use Topics   Alcohol use: No   Drug use: No    Family History  Problem Relation Age of Onset   Cancer Father        lung   Stroke Mother    Heart failure Mother    Cancer Brother    Diabetes Brother     Outpatient Encounter Medications as of 02/26/2023  Medication Sig   QUEtiapine (SEROQUEL) 25 MG tablet Take 25-50 mg by mouth daily as needed.   ALPRAZolam (XANAX) 1 MG tablet Take 1 mg by mouth 4 (four) times daily.   cinacalcet (SENSIPAR) 30 MG tablet Take 1  tablet (30 mg total) by mouth daily with breakfast.   ciprofloxacin (CIPRO) 500 MG tablet Take 500 mg by mouth 2 (two) times daily. (Patient not taking: Reported on 10/28/2022)   dicyclomine (BENTYL) 20 MG tablet Take 1 tablet (20 mg total) by mouth 2 (two) times daily.   diphenoxylate-atropine (LOMOTIL) 2.5-0.025 MG tablet Take 2 tablets by mouth 4 (four) times daily as needed.   Lactobacillus Rhamnosus, GG, (CULTURELLE) CAPS Take 1 capsule by mouth 2 (two) times daily. (Patient not taking: Reported on 10/28/2022)   levothyroxine (SYNTHROID) 25 MCG tablet Take 25 mcg by mouth daily before breakfast.   pantoprazole (PROTONIX) 40 MG tablet Take 40 mg by mouth 2 (two) times daily. (Patient not taking: Reported on 10/28/2022)   potassium chloride SA (KLOR-CON M) 20 MEQ tablet Take 1 tablet (20 mEq total) by mouth 2 (two) times daily for 7 days.   potassium chloride SA (KLOR-CON) 20 MEQ tablet Take 20 mEq by mouth daily.   sertraline (ZOLOFT) 25  MG tablet Take 25 mg by mouth daily.   No facility-administered encounter medications on file as of 02/26/2023.    Allergies  Allergen Reactions   Propranolol    Naprosyn [Naproxen] Nausea Only   Penicillins Nausea And Vomiting and Other (See Comments)    Has patient had a PCN reaction causing immediate rash, facial/tongue/throat swelling, SOB or lightheadedness with hypotension: No Has patient had a PCN reaction causing severe rash involving mucus membranes or skin necrosis: No Has patient had a PCN reaction that required hospitalization Yes Has patient had a PCN reaction occurring within the last 10 years: No If all of the above answers are "NO", then may proceed with Cephalosporin use.      HPI  Julia [name] was diagnosed with hypercalcemia since 2022.  After appropriate workup indicating possibility of primary hyperparathyroidism, coupled with the fact that she did not want to , and being a suitable candidate for surgery,  she was started on  low-dose Sensipar .  She continues to respond to this intervention with normocalcemia.   She is also on low-dose levothyroxine for hypothyroidism.  Patient is accompanied by her daughter. Per family, she is still on hospice care due to cognitive decline and failure to thrive.  Her hypercalcemia was complicated by osteoporosis for which she was also started on alendronate 70 mg weekly which she is no longer taking.  She wishes to continue treatment for hypercalcemia as well as hypothyroidism.  Her previsit labs are favorable.  No prior history of fragility fractures or falls. No history of  kidney stones. She complains of abdominal pain, GERD. No history of CKD.  she is not on HCTZ or other thiazide therapy.  No history of  vitamin D deficiency.   she is not on calcium supplements.  Her consumption of green leafy vegetables is suboptimal.  she does not have a family history of hypercalcemia, pituitary tumors, thyroid cancer, or osteoporosis.   I reviewed her chart and she also has a history of hyperlipidemia, hypertension on treatment..    ROS:  Constitutional: no weight gain/loss, no fatigue, no subjective hyperthermia, no subjective hypothermia   PE: BP 122/84   Pulse 72   Ht 5' (1.524 m)   Wt 77 lb (34.9 kg)   LMP 09/06/2012   BMI 15.04 kg/m , Body mass index is 15.04 kg/m. Wt Readings from Last 3 Encounters:  02/26/23 77 lb (34.9 kg)  10/28/22 75 lb 12.8 oz (34.4 kg)  08/01/22 77 lb 6 oz (35.1 kg)    Constitutional: + BMI 24.7 , + dysphoric and temperament, not in acute distress, normal state of mind Eyes: PERRLA, EOMI, no exophthalmos ENT: moist mucous membranes, no gross thyromegaly, no gross cervical lymphadenopathy    Recent Results (from the past 2160 hours)  Comprehensive metabolic panel     Status: None   Collection Time: 02/23/23 12:16 PM  Result Value Ref Range   Glucose 73 70 - 99 mg/dL   BUN 17 8 - 27 mg/dL   Creatinine, Ser 1.61 0.57 - 1.00 mg/dL    eGFR 82 >09 UE/AVW/0.98   BUN/Creatinine Ratio 21 12 - 28   Sodium 144 134 - 144 mmol/L   Potassium 3.9 3.5 - 5.2 mmol/L   Chloride 103 96 - 106 mmol/L   CO2 27 20 - 29 mmol/L   Calcium 9.6 8.7 - 10.3 mg/dL   Total Protein 6.4 6.0 - 8.5 g/dL   Albumin 4.3 3.9 - 4.9 g/dL   Globulin,  Total 2.1 1.5 - 4.5 g/dL   Bilirubin Total 0.7 0.0 - 1.2 mg/dL   Alkaline Phosphatase 121 44 - 121 IU/L   AST 17 0 - 40 IU/L   ALT 12 0 - 32 IU/L  TSH     Status: None   Collection Time: 02/23/23 12:16 PM  Result Value Ref Range   TSH 1.100 0.450 - 4.500 uIU/mL  T4, free     Status: None   Collection Time: 02/23/23 12:16 PM  Result Value Ref Range   Free T4 1.23 0.82 - 1.77 ng/dL  Magnesium     Status: None   Collection Time: 02/23/23 12:16 PM  Result Value Ref Range   Magnesium 1.8 1.6 - 2.3 mg/dL  Phosphorus     Status: None   Collection Time: 02/23/23 12:16 PM  Result Value Ref Range   Phosphorus 4.0 3.0 - 4.3 mg/dL  PTH, intact and calcium     Status: None   Collection Time: 02/23/23 12:16 PM  Result Value Ref Range   PTH 36 15 - 65 pg/mL   PTH Interp Comment     Comment: Interpretation                 Intact PTH    Calcium                                 (pg/mL)      (mg/dL) Normal                          15 - 65     8.6 - 10.2 Primary Hyperparathyroidism         >65          >10.2 Secondary Hyperparathyroidism       >65          <10.2 Non-Parathyroid Hypercalcemia       <65          >10.2 Hypoparathyroidism                  <15          < 8.6 Non-Parathyroid Hypocalcemia    15 - 65          < 8.6       Assessment: 1. Hypercalcemia / Hyperparathyroidism 2.  Osteoporosis 3.  Hypothyroidism  Plan: -She presents with relapsing hypercalcemia with treatment with Sensipar was withdrawn as part of her hospice care.  Patient would like to control her hypercalcemia. She is not a surgical candidate.  She will continue to benefit from low-dose Sensipar to avoid catastrophic hypercalcemia.     She is also advised to continue levothyroxine 25 mcg p.o. daily before breakfast.    She was also taken off of her alendronate she was taking for osteoporosis.  She is advised to maintain close follow-up with her PMD.   I spent  22  minutes in the care of the patient today including review of labs from Thyroid Function, CMP, and other relevant labs ; imaging/biopsy records (current and previous including abstractions from other facilities); face-to-face time discussing  her lab results and symptoms, medications doses, her options of short and long term treatment based on the latest standards of care / guidelines;   and documenting the encounter.  Tilda Burrow  participated in the discussions, expressed understanding, and voiced agreement with the above plans.  All  questions were answered to her satisfaction. she is encouraged to contact clinic should she have any questions or concerns prior to her return visit.   She will return with repeat labs in 4 months.   Marquis Lunch, MD [facility] [address] [place], [place] [address] Phone: [phone]  Fax: [phone]    This note was partially dictated with voice recognition software. Similar sounding words can be transcribed inadequately or may not  be corrected upon review.  02/26/2023, 9:54 AM

## 2023-04-09 ENCOUNTER — Other Ambulatory Visit: Payer: Self-pay | Admitting: "Endocrinology

## 2023-06-08 DIAGNOSIS — E162 Hypoglycemia, unspecified: Secondary | ICD-10-CM | POA: Diagnosis not present

## 2023-06-08 DIAGNOSIS — R000 Tachycardia, unspecified: Secondary | ICD-10-CM | POA: Diagnosis not present

## 2023-06-18 DIAGNOSIS — Z515 Encounter for palliative care: Secondary | ICD-10-CM | POA: Diagnosis not present

## 2023-06-18 DIAGNOSIS — R5383 Other fatigue: Secondary | ICD-10-CM | POA: Diagnosis not present

## 2023-06-18 DIAGNOSIS — G309 Alzheimer's disease, unspecified: Secondary | ICD-10-CM | POA: Diagnosis not present

## 2023-06-18 DIAGNOSIS — R569 Unspecified convulsions: Secondary | ICD-10-CM | POA: Diagnosis not present

## 2023-06-18 DIAGNOSIS — R630 Anorexia: Secondary | ICD-10-CM | POA: Diagnosis not present

## 2023-06-18 DIAGNOSIS — D320 Benign neoplasm of cerebral meninges: Secondary | ICD-10-CM | POA: Diagnosis not present

## 2023-06-18 DIAGNOSIS — R634 Abnormal weight loss: Secondary | ICD-10-CM | POA: Diagnosis not present

## 2023-08-19 DIAGNOSIS — E039 Hypothyroidism, unspecified: Secondary | ICD-10-CM | POA: Diagnosis not present

## 2023-08-20 LAB — COMPREHENSIVE METABOLIC PANEL WITH GFR
ALT: 7 IU/L (ref 0–32)
AST: 9 IU/L (ref 0–40)
Albumin: 4.3 g/dL (ref 3.9–4.9)
Alkaline Phosphatase: 169 IU/L — ABNORMAL HIGH (ref 44–121)
BUN/Creatinine Ratio: 15 (ref 12–28)
BUN: 12 mg/dL (ref 8–27)
Bilirubin Total: 0.3 mg/dL (ref 0.0–1.2)
CO2: 23 mmol/L (ref 20–29)
Calcium: 10.3 mg/dL (ref 8.7–10.3)
Chloride: 105 mmol/L (ref 96–106)
Creatinine, Ser: 0.8 mg/dL (ref 0.57–1.00)
Globulin, Total: 2.2 g/dL (ref 1.5–4.5)
Glucose: 82 mg/dL (ref 70–99)
Potassium: 4.4 mmol/L (ref 3.5–5.2)
Sodium: 140 mmol/L (ref 134–144)
Total Protein: 6.5 g/dL (ref 6.0–8.5)
eGFR: 83 mL/min/{1.73_m2} (ref >59)

## 2023-08-20 LAB — T4, FREE: Free T4: 1.09 ng/dL (ref 0.82–1.77)

## 2023-08-20 LAB — TSH: TSH: 1.61 u[IU]/mL (ref 0.450–4.500)

## 2023-08-26 DIAGNOSIS — G4089 Other seizures: Secondary | ICD-10-CM | POA: Diagnosis not present

## 2023-08-26 DIAGNOSIS — D320 Benign neoplasm of cerebral meninges: Secondary | ICD-10-CM | POA: Diagnosis not present

## 2023-08-26 DIAGNOSIS — Z515 Encounter for palliative care: Secondary | ICD-10-CM | POA: Diagnosis not present

## 2023-08-26 DIAGNOSIS — G309 Alzheimer's disease, unspecified: Secondary | ICD-10-CM | POA: Diagnosis not present

## 2023-08-27 ENCOUNTER — Ambulatory Visit (INDEPENDENT_AMBULATORY_CARE_PROVIDER_SITE_OTHER): Payer: Medicare HMO | Admitting: "Endocrinology

## 2023-08-27 ENCOUNTER — Encounter: Payer: Self-pay | Admitting: "Endocrinology

## 2023-08-27 DIAGNOSIS — E039 Hypothyroidism, unspecified: Secondary | ICD-10-CM

## 2023-08-27 MED ORDER — VITAMIN D3 50 MCG (2000 UT) PO CAPS: 2000.0000 [IU] | ORAL_CAPSULE | Freq: Every day | ORAL | 1 refills | Status: AC

## 2023-08-27 NOTE — Progress Notes (Signed)
 08/27/2023, 10:56 AM   Endocrinology follow-up note  Julia [name] is a 63 y.o.-year-old female, referred by her  Kathyleen Parkins, MD  , for evaluation for hypercalcemia/hyperparathyroidism.  She is returning to discuss her recent labs .  Past Medical History:  Diagnosis Date   Alzheimer disease (HCC)    Anxiety    Brain tumor (benign) (HCC)    Chronic pain    Dementia (HCC)    Depression    Elevated cholesterol    Headache(784.0)    Hypertension    Numerous moles    Osteoporosis    Seizures (HCC)    Trichimoniasis     Past Surgical History:  Procedure Laterality Date   EXCISION OF SKIN TAG N/A 09/22/2017   Procedure: EXCISION OF SKIN TAGS ON FACE AND NECK (Procedure #2);  Surgeon: Albino Hum, MD;  Location: [facility];  Service: Gynecology;  Laterality: N/A;   HYSTEROSCOPY WITH D & C N/A 09/22/2017   Procedure: DILATATION AND CURETTAGE /HYSTEROSCOPY; EXCISION OF VULVAR AND RIGHT AND LEFT THIGH SKIN TAGS (Procedure #1);  Surgeon: Albino Hum, MD;  Location: [facility];  Service: Gynecology;  Laterality: N/A;   POLYPECTOMY N/A 09/22/2017   Procedure: REMOVAL OF ENDOMETRIAL POLYP (Procedure #1);  Surgeon: Albino Hum, MD;  Location: [facility];  Service: Gynecology;  Laterality: N/A;   TUBAL LIGATION      Social History   Tobacco Use   Smoking status: Never   Smokeless tobacco: Never  Vaping Use   Vaping status: Never Used  Substance Use Topics   Alcohol use: No   Drug use: No    Family History  Problem Relation Age of Onset   Cancer Father        lung   Stroke Mother    Heart failure Mother    Cancer Brother    Diabetes Brother     Outpatient Encounter Medications as of 08/27/2023  Medication Sig   Cholecalciferol (VITAMIN D3) 50 MCG (2000 UT) capsule Take 1 capsule (2,000 Units total) by mouth daily.   ALPRAZolam  (XANAX ) 1 MG tablet Take 1 mg by mouth 4 (four) times daily.   cinacalcet   (SENSIPAR ) 30 MG tablet TAKE ONE TABLET BY MOUTH EVERY DAY BEFORE BREAKFAST   ciprofloxacin  (CIPRO ) 500 MG tablet Take 500 mg by mouth 2 (two) times daily. (Patient not taking: Reported on 10/28/2022)   dicyclomine  (BENTYL ) 20 MG tablet Take 1 tablet (20 mg total) by mouth 2 (two) times daily.   diphenoxylate-atropine (LOMOTIL) 2.5-0.025 MG tablet Take 2 tablets by mouth 4 (four) times daily as needed.   Lactobacillus Rhamnosus, GG, (CULTURELLE) CAPS Take 1 capsule by mouth 2 (two) times daily. (Patient not taking: Reported on 10/28/2022)   levothyroxine  (SYNTHROID ) 25 MCG tablet Take 25 mcg by mouth daily before breakfast.   pantoprazole  (PROTONIX ) 40 MG tablet Take 40 mg by mouth 2 (two) times daily. (Patient not taking: Reported on 10/28/2022)   potassium chloride  SA (KLOR-CON  M) 20 MEQ tablet Take 1 tablet (20 mEq total) by mouth 2 (two) times daily for 7 days.   potassium chloride  SA (KLOR-CON ) 20 MEQ tablet Take 20 mEq by mouth daily.   QUEtiapine  (  SEROQUEL ) 25 MG tablet Take 25-50 mg by mouth daily as needed.   sertraline (ZOLOFT) 25 MG tablet Take 25 mg by mouth daily.   No facility-administered encounter medications on file as of 08/27/2023.    Allergies  Allergen Reactions   Propranolol     Naprosyn [Naproxen] Nausea Only   Penicillins Nausea And Vomiting and Other (See Comments)    Has patient had a PCN reaction causing immediate rash, facial/tongue/throat swelling, SOB or lightheadedness with hypotension: No Has patient had a PCN reaction causing severe rash involving mucus membranes or skin necrosis: No Has patient had a PCN reaction that required hospitalization Yes Has patient had a PCN reaction occurring within the last 10 years: No If all of the above answers are NO, then may proceed with Cephalosporin use.      HPI  Julia [name] was diagnosed with hypercalcemia since 2022.  After appropriate workup indicating possibility of primary hyperparathyroidism, coupled with  the fact that she did not want to, and being an unsuitable candidate for surgery,  she was started on low-dose Sensipar .  She is currently on Sensipar  30 mg p.o. every day.  She continues to respond to this intervention with calcium in target range. She is also on low-dose levothyroxine  for hypothyroidism.  Patient is accompanied by her daughter. Per family, she is still on hospice care due to cognitive decline and failure to thrive.  Her hypercalcemia was complicated by osteoporosis for which she was also started on alendronate  70 mg weekly which she is no longer taking.  She wishes to continue treatment for hypercalcemia as well as hypothyroidism.  Her previsit labs are favorable.  No prior history of fragility fractures or falls. No history of  kidney stones. She complains of abdominal pain, GERD. No history of CKD.  she is not on HCTZ or other thiazide therapy.  No history of  vitamin D deficiency.   she is not on calcium supplements.  Her consumption of green leafy vegetables is suboptimal.  she does not have a family history of hypercalcemia, pituitary tumors, thyroid  cancer, or osteoporosis.   I reviewed her chart and she also has a history of hyperlipidemia, hypertension on treatment..    ROS:  Constitutional: no weight gain/loss, no fatigue, no subjective hyperthermia, no subjective hypothermia   PE: BP 132/78   Pulse 76   Ht 5' (1.524 m)   Wt 82 lb 3.2 oz (37.3 kg)   LMP 09/06/2012   BMI 16.05 kg/m , Body mass index is 16.05 kg/m. Wt Readings from Last 3 Encounters:  08/27/23 82 lb 3.2 oz (37.3 kg)  02/26/23 77 lb (34.9 kg)  10/28/22 75 lb 12.8 oz (34.4 kg)    Constitutional: + BMI 24.7 , + dysphoric and temperament, not in acute distress, normal state of mind Eyes: PERRLA, EOMI, no exophthalmos ENT: moist mucous membranes, no gross thyromegaly, no gross cervical lymphadenopathy    Recent Results (from the past 2160 hours)  TSH     Status: None   Collection  Time: 08/19/23 11:04 AM  Result Value Ref Range   TSH 1.610 0.450 - 4.500 uIU/mL  T4, free     Status: None   Collection Time: 08/19/23 11:04 AM  Result Value Ref Range   Free T4 1.09 0.82 - 1.77 ng/dL  Comprehensive metabolic panel     Status: Abnormal   Collection Time: 08/19/23 11:04 AM  Result Value Ref Range   Glucose 82 70 - 99 mg/dL   BUN 12  8 - 27 mg/dL   Creatinine, Ser 4.09 0.57 - 1.00 mg/dL   eGFR 83 >81 XB/JYN/8.29   BUN/Creatinine Ratio 15 12 - 28   Sodium 140 134 - 144 mmol/L   Potassium 4.4 3.5 - 5.2 mmol/L   Chloride 105 96 - 106 mmol/L   CO2 23 20 - 29 mmol/L   Calcium 10.3 8.7 - 10.3 mg/dL   Total Protein 6.5 6.0 - 8.5 g/dL   Albumin 4.3 3.9 - 4.9 g/dL   Globulin, Total 2.2 1.5 - 4.5 g/dL   Bilirubin Total 0.3 0.0 - 1.2 mg/dL   Alkaline Phosphatase 169 (H) 44 - 121 IU/L   AST 9 0 - 40 IU/L   ALT 7 0 - 32 IU/L      Assessment: 1. Hypercalcemia / Hyperparathyroidism 2.  Hypothyroidism 3.  Vitamin D deficiency  Plan: -She presents with controlled calcium on Sensipar  treatment.  She is advised to continue Sensipar  30 mg p.o. every day at breakfast to avoid severe hypercalcemia.    She is also advised to continue levothyroxine  25 mcg p.o. daily before breakfast.    - We discussed about the correct intake of her thyroid  hormone, on empty stomach at fasting, with water , separated by at least 30 minutes from breakfast and other medications,  and separated by more than 4 hours from calcium, iron, multivitamins, acid reflux medications (PPIs). -Patient is made aware of the fact that thyroid  hormone replacement is needed for life, dose to be adjusted by periodic monitoring of thyroid  function tests.  She was also taken off of her alendronate  she was taking for osteoporosis. She does have unexplained elevation of alk phos, she is advised to start vitamin D3 2000 units daily.  She is advised to maintain close follow-up with her PMD.  I spent  20  minutes in the  care of the patient today including review of labs from Thyroid  Function, CMP, and other relevant labs ; imaging/biopsy records (current and previous including abstractions from other facilities); face-to-face time discussing  her lab results and symptoms, medications doses, her options of short and long term treatment based on the latest standards of care / guidelines;   and documenting the encounter.  Kandice Orleans  participated in the discussions, expressed understanding, and voiced agreement with the above plans.  All questions were answered to her satisfaction. she is encouraged to contact clinic should she have any questions or concerns prior to her return visit.    She will return with repeat labs in 4 months.   Kalvin Orf, MD [facility] [address] [place], [place] [address] Phone: [phone]  Fax: [phone]    This note was partially dictated with voice recognition software. Similar sounding words can be transcribed inadequately or may not  be corrected upon review.  08/27/2023, 10:56 AM

## 2023-10-08 ENCOUNTER — Other Ambulatory Visit: Payer: Self-pay

## 2023-10-08 MED ORDER — CINACALCET HCL 30 MG PO TABS
30.0000 mg | ORAL_TABLET | Freq: Every day | ORAL | 1 refills | Status: AC
Start: 2023-10-08 — End: ?

## 2023-10-12 ENCOUNTER — Other Ambulatory Visit: Payer: Self-pay | Admitting: "Endocrinology

## 2023-11-12 ENCOUNTER — Encounter: Payer: Self-pay | Admitting: Physician Assistant

## 2023-11-12 ENCOUNTER — Ambulatory Visit (INDEPENDENT_AMBULATORY_CARE_PROVIDER_SITE_OTHER): Admitting: Physician Assistant

## 2023-11-12 VITALS — BP 128/90 | Temp 98.2°F | Wt 86.4 lb

## 2023-11-12 DIAGNOSIS — Z23 Encounter for immunization: Secondary | ICD-10-CM

## 2023-11-12 DIAGNOSIS — F419 Anxiety disorder, unspecified: Secondary | ICD-10-CM | POA: Diagnosis not present

## 2023-11-12 DIAGNOSIS — E538 Deficiency of other specified B group vitamins: Secondary | ICD-10-CM | POA: Diagnosis not present

## 2023-11-12 DIAGNOSIS — I10 Essential (primary) hypertension: Secondary | ICD-10-CM

## 2023-11-12 DIAGNOSIS — E039 Hypothyroidism, unspecified: Secondary | ICD-10-CM

## 2023-11-12 DIAGNOSIS — E782 Mixed hyperlipidemia: Secondary | ICD-10-CM

## 2023-11-12 DIAGNOSIS — Z7689 Persons encountering health services in other specified circumstances: Secondary | ICD-10-CM

## 2023-11-12 DIAGNOSIS — E212 Other hyperparathyroidism: Secondary | ICD-10-CM

## 2023-11-12 NOTE — Assessment & Plan Note (Signed)
 Patient reports history of monthly B12 injections. No previous labs available today. CBC and B12 labs today. Follow up for nurse visit for B12 injection as indicated.

## 2023-11-12 NOTE — Assessment & Plan Note (Signed)
 128/90 Controlled. Continue current medications. No change in management. Discussed DASH diet and dietary sodium restrictions.  Continue dietary efforts and physical activity.

## 2023-11-12 NOTE — Assessment & Plan Note (Signed)
 Stable. TSH and T4 today. Continue with current regimen.

## 2023-11-12 NOTE — Progress Notes (Signed)
 New Patient Office Visit  Subjective    Patient ID: Julia [name], female    DOB: 11-Jan-1961  Age: 63 y.o. MRN: [id number]  CC:  Chief Complaint  Patient presents with   Acute Visit    Pt's PCP retired and pt need to establish new PCP. Pt need's B 12 injection    HPI Julia [name] presents to establish care  Discussed the use of AI scribe software for clinical note transcription with the patient, who gave verbal consent to proceed.  History of Present Illness Julia [name] is a 63 year old female who presents for transfer of care due to previous PCP retiring.   She has Alzheimer's dementia, anxiety, hypertension, hypothyroidism, and a history of high calcium  levels. Her medications include sertraline, amlodipine , thyroid  medication, Xanax , vitamin D, potassium, Zoloft, and Seroquel . She transitioned from hospice to palliative care a year and a half ago. She has not received pneumonia or shingles vaccines recently and is due for a B12 shot, which she receives monthly. She does not have other concerns or complaints today aside from establishing care.     Outpatient Encounter Medications as of 11/12/2023  Medication Sig   ALPRAZolam  (XANAX ) 1 MG tablet Take 1 mg by mouth 4 (four) times daily.   amLODipine  (NORVASC ) 5 MG tablet Take 10 mg by mouth daily.   Cholecalciferol (VITAMIN D3) 50 MCG (2000 UT) capsule Take 1 capsule (2,000 Units total) by mouth daily.   cinacalcet  (SENSIPAR ) 30 MG tablet Take 1 tablet (30 mg total) by mouth daily with breakfast.   levothyroxine  (SYNTHROID ) 25 MCG tablet Take 25 mcg by mouth daily before breakfast.   potassium chloride  SA (KLOR-CON  M) 20 MEQ tablet Take 1 tablet (20 mEq total) by mouth 2 (two) times daily for 7 days.   QUEtiapine  (SEROQUEL ) 25 MG tablet Take 25-50 mg by mouth daily as needed.   sertraline (ZOLOFT) 25 MG tablet Take 25 mg by mouth daily.   [DISCONTINUED] potassium chloride  SA (KLOR-CON ) 20 MEQ tablet Take 20 mEq by mouth  daily.   [DISCONTINUED] ciprofloxacin  (CIPRO ) 500 MG tablet Take 500 mg by mouth 2 (two) times daily. (Patient not taking: Reported on 10/28/2022)   [DISCONTINUED] dicyclomine  (BENTYL ) 20 MG tablet Take 1 tablet (20 mg total) by mouth 2 (two) times daily.   [DISCONTINUED] diphenoxylate-atropine (LOMOTIL) 2.5-0.025 MG tablet Take 2 tablets by mouth 4 (four) times daily as needed.   [DISCONTINUED] Lactobacillus Rhamnosus, GG, (CULTURELLE) CAPS Take 1 capsule by mouth 2 (two) times daily. (Patient not taking: Reported on 10/28/2022)   [DISCONTINUED] pantoprazole  (PROTONIX ) 40 MG tablet Take 40 mg by mouth 2 (two) times daily. (Patient not taking: Reported on 10/28/2022)   No facility-administered encounter medications on file as of 11/12/2023.    Past Medical History:  Diagnosis Date   Alzheimer disease (HCC)    Anxiety    Brain tumor (benign) (HCC)    Chronic pain    Dementia (HCC)    Depression    Elevated cholesterol    Headache(784.0)    Hypertension    Numerous moles    Osteoporosis    Seizures (HCC)    Trichimoniasis     Past Surgical History:  Procedure Laterality Date   EXCISION OF SKIN TAG N/A 09/22/2017   Procedure: EXCISION OF SKIN TAGS ON FACE AND NECK (Procedure #2);  Surgeon: Edsel Norleen GAILS, MD;  Location: [facility];  Service: Gynecology;  Laterality: N/A;   HYSTEROSCOPY WITH D & C N/A  09/22/2017   Procedure: DILATATION AND CURETTAGE /HYSTEROSCOPY; EXCISION OF VULVAR AND RIGHT AND LEFT THIGH SKIN TAGS (Procedure #1);  Surgeon: Edsel Norleen GAILS, MD;  Location: [facility];  Service: Gynecology;  Laterality: N/A;   POLYPECTOMY N/A 09/22/2017   Procedure: REMOVAL OF ENDOMETRIAL POLYP (Procedure #1);  Surgeon: Edsel Norleen GAILS, MD;  Location: [facility];  Service: Gynecology;  Laterality: N/A;   TUBAL LIGATION      Family History  Problem Relation Age of Onset   Cancer Father        lung   Stroke Mother    Heart failure Mother    Cancer Brother    Diabetes Brother     Social  History   Socioeconomic History   Marital status: Widowed    Spouse name: Not on file   Number of children: 3   Years of education: Not on file   Highest education level: Not on file  Occupational History   Not on file  Tobacco Use   Smoking status: Never   Smokeless tobacco: Never  Vaping Use   Vaping status: Never Used  Substance and Sexual Activity   Alcohol use: No   Drug use: No   Sexual activity: Not Currently    Birth control/protection: Post-menopausal, Surgical    Comment: tubal  Other Topics Concern   Not on file  Social History Narrative   Not on file   Social Drivers of Health   Financial Resource Strain: Not on file  Food Insecurity: Not on file  Transportation Needs: Not on file  Physical Activity: Not on file  Stress: Not on file  Social Connections: Not on file  Intimate Partner Violence: Not on file    Review of Systems  Constitutional:  Negative for chills, fever and malaise/fatigue.  Eyes:  Negative for blurred vision and double vision.  Respiratory:  Negative for cough and shortness of breath.   Cardiovascular:  Negative for chest pain and palpitations.  Neurological:  Negative for dizziness and headaches.  Psychiatric/Behavioral:  Negative for depression. The patient is nervous/anxious.         Objective    BP (!) 128/90   Temp 98.2 F (36.8 C)   Wt 86 lb 6.4 oz (39.2 kg)   LMP 09/06/2012   SpO2 99%   BMI 16.87 kg/m   Physical Exam Constitutional:      General: She is not in acute distress.    Appearance: Normal appearance. She is underweight. She is not ill-appearing.  HENT:     Head: Normocephalic and atraumatic.     Mouth/Throat:     Mouth: Mucous membranes are moist.     Pharynx: Oropharynx is clear.  Eyes:     Extraocular Movements: Extraocular movements intact.     Conjunctiva/sclera: Conjunctivae normal.  Cardiovascular:     Rate and Rhythm: Normal rate and regular rhythm.     Heart sounds: Normal heart sounds. No  murmur heard. Pulmonary:     Effort: Pulmonary effort is normal.     Breath sounds: Normal breath sounds. No wheezing or rales.  Musculoskeletal:     Right lower leg: No edema.     Left lower leg: No edema.  Skin:    General: Skin is warm and dry.  Neurological:     General: No focal deficit present.     Mental Status: She is alert and oriented to person, place, and time.  Psychiatric:        Mood and Affect: Mood  normal.        Behavior: Behavior normal.       Assessment & Plan:  Encounter to establish care  Essential hypertension Assessment & Plan: 128/90 Controlled. Continue current medications. No change in management. Discussed DASH diet and dietary sodium restrictions.  Continue dietary efforts and physical activity.   Orders: -     Lipid panel -     CMP14+EGFR -     CBC with Differential/Platelet  Hypothyroidism, unspecified type Assessment & Plan: Stable. TSH and T4 today. Continue with current regimen.   Orders: -     TSH + free T4  Other hyperparathyroidism (HCC) Assessment & Plan: Stable. Continue regular follow up with endocrinology. Continue with Sensipar .    Anxiety Assessment & Plan: Stable. Continue current treatment plan.    B12 deficiency Assessment & Plan: Patient reports history of monthly B12 injections. No previous labs available today. CBC and B12 labs today. Follow up for nurse visit for B12 injection as indicated.   Orders: -     Vitamin B12  Mixed hyperlipidemia -     Lipid panel    Return in about 6 months (around 05/11/2024).   Julia Kerrington Sova, PA-C

## 2023-11-12 NOTE — Assessment & Plan Note (Signed)
 Stable. Continue regular follow up with endocrinology. Continue with Sensipar .

## 2023-11-12 NOTE — Assessment & Plan Note (Signed)
 Stable. Continue current treatment plan.

## 2023-11-13 ENCOUNTER — Other Ambulatory Visit: Payer: Self-pay | Admitting: Physician Assistant

## 2023-11-13 ENCOUNTER — Ambulatory Visit: Payer: Self-pay | Admitting: Physician Assistant

## 2023-11-13 DIAGNOSIS — E782 Mixed hyperlipidemia: Secondary | ICD-10-CM

## 2023-11-13 LAB — CMP14+EGFR
ALT: 13 IU/L (ref 0–32)
AST: 15 IU/L (ref 0–40)
Albumin: 4.5 g/dL (ref 3.9–4.9)
Alkaline Phosphatase: 163 IU/L — ABNORMAL HIGH (ref 44–121)
BUN/Creatinine Ratio: 18 (ref 12–28)
BUN: 13 mg/dL (ref 8–27)
Bilirubin Total: 0.3 mg/dL (ref 0.0–1.2)
CO2: 27 mmol/L (ref 20–29)
Calcium: 10.2 mg/dL (ref 8.7–10.3)
Chloride: 100 mmol/L (ref 96–106)
Creatinine, Ser: 0.72 mg/dL (ref 0.57–1.00)
Globulin, Total: 1.9 g/dL (ref 1.5–4.5)
Glucose: 76 mg/dL (ref 70–99)
Potassium: 4 mmol/L (ref 3.5–5.2)
Sodium: 139 mmol/L (ref 134–144)
Total Protein: 6.4 g/dL (ref 6.0–8.5)
eGFR: 93 mL/min/1.73 (ref >59)

## 2023-11-13 LAB — TSH+FREE T4
Free T4: 1.07 ng/dL (ref 0.82–1.77)
TSH: 1.64 u[IU]/mL (ref 0.450–4.500)

## 2023-11-13 LAB — CBC WITH DIFFERENTIAL/PLATELET
Basophils Absolute: 0 x10E3/uL (ref 0.0–0.2)
Basos: 0 %
EOS (ABSOLUTE): 0 x10E3/uL (ref 0.0–0.4)
Eos: 1 %
Hematocrit: 38 % (ref 34.0–46.6)
Hemoglobin: 13 g/dL (ref 11.1–15.9)
Immature Grans (Abs): 0 x10E3/uL (ref 0.0–0.1)
Immature Granulocytes: 0 %
Lymphocytes Absolute: 1.6 x10E3/uL (ref 0.7–3.1)
Lymphs: 34 %
MCH: 31.6 pg (ref 26.6–33.0)
MCHC: 34.2 g/dL (ref 31.5–35.7)
MCV: 92 fL (ref 79–97)
Monocytes Absolute: 0.4 x10E3/uL (ref 0.1–0.9)
Monocytes: 9 %
Neutrophils Absolute: 2.6 x10E3/uL (ref 1.4–7.0)
Neutrophils: 56 %
Platelets: 201 x10E3/uL (ref 150–450)
RBC: 4.12 x10E6/uL (ref 3.77–5.28)
RDW: 13.1 % (ref 11.7–15.4)
WBC: 4.6 x10E3/uL (ref 3.4–10.8)

## 2023-11-13 LAB — LIPID PANEL
Chol/HDL Ratio: 2.7 ratio (ref 0.0–4.4)
Cholesterol, Total: 242 mg/dL — ABNORMAL HIGH (ref 100–199)
HDL: 90 mg/dL (ref >39)
LDL Chol Calc (NIH): 140 mg/dL — ABNORMAL HIGH (ref 0–99)
Triglycerides: 74 mg/dL (ref 0–149)
VLDL Cholesterol Cal: 12 mg/dL (ref 5–40)

## 2023-11-13 LAB — VITAMIN B12: Vitamin B-12: 560 pg/mL (ref 232–1245)

## 2023-11-13 MED ORDER — ROSUVASTATIN CALCIUM 10 MG PO TABS: 10.0000 mg | ORAL_TABLET | Freq: Every day | ORAL | 3 refills | Status: DC

## 2023-11-13 NOTE — Telephone Encounter (Signed)
-----   Message from Ronneby Grooms sent at 11/13/2023 11:10 AM EDT ----- Lab results reviewed and overall stable.  Cholesterol is elevated, I would advised daily cholesterol lowering medication. Can send to pharmacy if patient is willing.  Metabolic panel- kidney/liver/electrolytes are normal.  Blood count is normal.  Thyroid  function is normal, continue current dose of levothyroxine .  B12 is normal at this time. Does not necessitate injection at current.  ----- Message ----- From: Interface, [facility] Lab Results In Sent: 11/13/2023   5:39 AM EDT To: Charmaine Grooms, PA-C

## 2023-11-13 NOTE — Telephone Encounter (Signed)
 Informed patient of results and recommendations.  Patient is willing to start a medication.

## 2023-11-26 ENCOUNTER — Ambulatory Visit

## 2023-12-25 ENCOUNTER — Ambulatory Visit (INDEPENDENT_AMBULATORY_CARE_PROVIDER_SITE_OTHER)

## 2023-12-25 VITALS — Ht 60.0 in | Wt 86.0 lb

## 2023-12-25 DIAGNOSIS — Z0000 Encounter for general adult medical examination without abnormal findings: Secondary | ICD-10-CM | POA: Diagnosis not present

## 2023-12-25 NOTE — Progress Notes (Signed)
 Subjective:   Julia [name] is a 63 y.o. who presents for a Medicare Wellness preventive visit.  As a reminder, Annual Wellness Visits don't include a physical exam, and some assessments may be limited, especially if this visit is performed virtually. We may recommend an in-person follow-up visit with your provider if needed.  Visit Complete: Virtual I connected with  [name] on 12/25/23 by a audio enabled telemedicine application and verified that I am speaking with the correct person using two identifiers.  Patient Location: Home  Provider Location: Home Office  I discussed the limitations of evaluation and management by telemedicine. The patient expressed understanding and agreed to proceed.  Vital Signs: Because this visit was a virtual/telehealth visit, some criteria may be missing or patient reported. Any vitals not documented were not able to be obtained and vitals that have been documented are patient reported.  VideoDeclined- This patient declined Librarian, academic. Therefore the visit was completed with audio only.  Persons Participating in Visit: Patient assisted by daughterYariana [name].  AWV Questionnaire: No: Patient Medicare AWV questionnaire was not completed prior to this visit.  Cardiac Risk Factors include: dyslipidemia;hypertension     Objective:    Today's Vitals   12/25/23 1302  Weight: 86 lb (39 kg)  Height: 5' (1.524 m)   Body mass index is 16.8 kg/m.     12/25/2023    1:06 PM 02/24/2022   11:42 AM 02/19/2022    3:37 PM 12/28/2021   10:17 AM 12/24/2021    8:09 AM 10/30/2021    6:55 PM 07/26/2021    9:57 AM  Advanced Directives  Does Patient Have a Medical Advance Directive? No No No No No No No  Would patient like information on creating a medical advance directive? Yes (MAU/Ambulatory/Procedural Areas - Information given) No - Patient declined  No - Patient declined Yes (ED - Information included in  AVS)  No - Patient declined    Current Medications (verified) Outpatient Encounter Medications as of 12/25/2023  Medication Sig   ALPRAZolam  (XANAX ) 1 MG tablet Take 1 mg by mouth 4 (four) times daily.   amLODipine  (NORVASC ) 5 MG tablet Take 10 mg by mouth daily.   Cholecalciferol (VITAMIN D3) 50 MCG (2000 UT) capsule Take 1 capsule (2,000 Units total) by mouth daily.   cinacalcet  (SENSIPAR ) 30 MG tablet Take 1 tablet (30 mg total) by mouth daily with breakfast.   levothyroxine  (SYNTHROID ) 25 MCG tablet Take 25 mcg by mouth daily before breakfast.   potassium chloride  SA (KLOR-CON  M) 20 MEQ tablet Take 1 tablet (20 mEq total) by mouth 2 (two) times daily for 7 days.   QUEtiapine  (SEROQUEL ) 25 MG tablet Take 25-50 mg by mouth daily as needed.   rosuvastatin  (CRESTOR ) 10 MG tablet Take 1 tablet (10 mg total) by mouth daily.   sertraline (ZOLOFT) 25 MG tablet Take 25 mg by mouth daily.   No facility-administered encounter medications on file as of 12/25/2023.    Allergies (verified) Propranolol , Naprosyn [naproxen], and Penicillins   History: Past Medical History:  Diagnosis Date   Alzheimer disease (HCC)    Anxiety    Brain tumor (benign) (HCC)    Chronic pain    Dementia (HCC)    Depression    Elevated cholesterol    Headache(784.0)    Hypertension    Numerous moles    Osteoporosis    Seizures (HCC)    Trichimoniasis    Past Surgical History:  Procedure Laterality Date   EXCISION OF SKIN TAG N/A 09/22/2017   Procedure: EXCISION OF SKIN TAGS ON FACE AND NECK (Procedure #2);  Surgeon: Edsel Norleen GAILS, MD;  Location: [facility];  Service: Gynecology;  Laterality: N/A;   HYSTEROSCOPY WITH D & C N/A 09/22/2017   Procedure: DILATATION AND CURETTAGE /HYSTEROSCOPY; EXCISION OF VULVAR AND RIGHT AND LEFT THIGH SKIN TAGS (Procedure #1);  Surgeon: Edsel Norleen GAILS, MD;  Location: [facility];  Service: Gynecology;  Laterality: N/A;   POLYPECTOMY N/A 09/22/2017   Procedure: REMOVAL OF  ENDOMETRIAL POLYP (Procedure #1);  Surgeon: Edsel Norleen GAILS, MD;  Location: [facility];  Service: Gynecology;  Laterality: N/A;   TUBAL LIGATION     Family History  Problem Relation Age of Onset   Cancer Father        lung   Stroke Mother    Heart failure Mother    Cancer Brother    Diabetes Brother    Social History   Socioeconomic History   Marital status: Widowed    Spouse name: Not on file   Number of children: 3   Years of education: Not on file   Highest education level: Not on file  Occupational History   Not on file  Tobacco Use   Smoking status: Never   Smokeless tobacco: Never  Vaping Use   Vaping status: Never Used  Substance and Sexual Activity   Alcohol use: No   Drug use: No   Sexual activity: Not Currently    Birth control/protection: Post-menopausal, Surgical    Comment: tubal  Other Topics Concern   Not on file  Social History Narrative   Lives with daughter Allena; receives personal care services 7 days/week    Social Drivers of Health   Financial Resource Strain: Low Risk  (12/25/2023)   Overall Financial Resource Strain (CARDIA)    Difficulty of Paying Living Expenses: Not hard at all  Food Insecurity: No Food Insecurity (12/25/2023)   Hunger Vital Sign    Worried About Running Out of Food in the Last Year: Never true    Ran Out of Food in the Last Year: Never true  Transportation Needs: No Transportation Needs (12/25/2023)   PRAPARE - Administrator, Civil Service (Medical): No    Lack of Transportation (Non-Medical): No  Physical Activity: Inactive (12/25/2023)   Exercise Vital Sign    Days of Exercise per Week: 0 days    Minutes of Exercise per Session: 0 min  Stress: No Stress Concern Present (12/25/2023)   [employer] of Occupational Health - Occupational Stress Questionnaire    Feeling of Stress: Only a little  Social Connections: Socially Isolated (12/25/2023)   Social Connection and Isolation Panel    Frequency of  Communication with Friends and Family: More than three times a week    Frequency of Social Gatherings with Friends and Family: Three times a week    Attends Religious Services: Never    Active Member of Clubs or Organizations: No    Attends Banker Meetings: Never    Marital Status: Widowed    Tobacco Counseling Counseling given: Not Answered    Clinical Intake:  Pre-visit preparation completed: Yes  Pain : No/denies pain  Diabetes: No  How often do you need to have someone help you when you read instructions, pamphlets, or other written materials from your doctor or pharmacy?: 1 - Never  Interpreter Needed?: No  Information entered by :: Charmaine Bloodgood LPN  Activities of Daily Living     12/25/2023    1:06 PM  In your present state of health, do you have any difficulty performing the following activities:  Hearing? 0  Vision? 0  Difficulty concentrating or making decisions? 1  Walking or climbing stairs? 0  Dressing or bathing? 1  Doing errands, shopping? 1  Preparing Food and eating ? N  Using the Toilet? N  In the past six months, have you accidently leaked urine? Y  Do you have problems with loss of bowel control? Y  Managing your Medications? Y  Managing your Finances? Y  Housekeeping or managing your Housekeeping? Y    Patient Care Team: Grooms, Charmaine, [place] as PCP - General (Physician Assistant) Kristine Ronal BIRCH, RD as Dietitian (Nutrition) Federico Rosario BROCKS, MD as Consulting Physician (Gastroenterology) Lenis Ethelle ORN, MD as Consulting Physician (Endocrinology)  I have updated your Care Teams any recent Medical Services you may have received from other providers in the past year.     Assessment:   This is a routine wellness examination for Teyah.  Hearing/Vision screen Hearing Screening - Comments:: Denies hearing difficulties   Vision Screening - Comments:: No vision problems     Goals Addressed             This  Visit's Progress    Prevent falls   On track      Depression Screen     12/25/2023    1:04 PM 11/12/2023    9:38 AM 08/22/2021    1:49 PM 06/09/2017   10:10 AM  PHQ 2/9 Scores  PHQ - 2 Score 1 1 3 1   PHQ- 9 Score 4 4 5      Fall Risk     12/25/2023    1:06 PM 11/12/2023    9:40 AM 08/22/2021    1:49 PM  Fall Risk   Falls in the past year? 0 0 0  Number falls in past yr: 0  0  Injury with Fall? 0  0  Risk for fall due to : No Fall Risks    Follow up Falls prevention discussed;Education provided;Falls evaluation completed      MEDICARE RISK AT HOME:  Medicare Risk at Home Any stairs in or around the home?: No If so, are there any without handrails?: No Home free of loose throw rugs in walkways, pet beds, electrical cords, etc?: Yes Adequate lighting in your home to reduce risk of falls?: Yes Life alert?: No Use of a cane, walker or w/c?: No Grab bars in the bathroom?: Yes Shower chair or bench in shower?: Yes Elevated toilet seat or a handicapped toilet?: Yes  TIMED UP AND GO:  Was the test performed?  No  Cognitive Function: Impaired: Patient has current diagnosis of cognitive impairment.    02/17/2022    4:00 PM 02/17/2022    3:46 PM  MMSE - Mini Mental State Exam  Not completed:  Unable to complete  Orientation to time 4   Orientation to Place 1   Registration 3   Attention/ Calculation 0   Recall 0   Language- name 2 objects 2   Language- repeat 0   Language- follow 3 step command 1   Language- read & follow direction 1   Write a sentence 1   Copy design 0   Total score 13         Immunizations Immunization History  Administered Date(s) Administered   Influenza, Seasonal, Injecte, Preservative Fre 11/12/2023  Screening Tests Health Maintenance  Topic Date Due   COVID-19 Vaccine (1) Never done   Hepatitis C Screening  Never done   DTaP/Tdap/Td (1 - Tdap) Never done   Pneumococcal Vaccine: 50+ Years (1 of 2 - PCV) Never done   Zoster  Vaccines- Shingrix (1 of 2) Never done   Colonoscopy  03/19/2024   Cervical Cancer Screening (HPV/Pap Cotest)  11/08/2024 (Originally 06/10/2022)   Mammogram  11/11/2024 (Originally 10/25/2023)   Medicare Annual Wellness (AWV)  12/24/2024   Influenza Vaccine  Completed   HIV Screening  Completed   Hepatitis B Vaccines 19-59 Average Risk  Aged Out   HPV VACCINES  Aged Out   Meningococcal B Vaccine  Aged Out    Health Maintenance Items Addressed: Vaccines Due: Declines Pneumonia, Shingrix and TDap   Additional Screening:  Vision Screening: Recommended annual ophthalmology exams for early detection of glaucoma and other disorders of the eye. Is the patient up to date with their annual eye exam?  No  Who is the provider or what is the name of the office in which the patient attends annual eye exams? none  Dental Screening: Recommended annual dental exams for proper oral hygiene  Community Resource Referral / Chronic Care Management: CRR required this visit?  No   CCM required this visit?  No   Plan:    I have personally reviewed and noted the following in the patient's chart:   Medical and social history Use of alcohol, tobacco or illicit drugs  Current medications and supplements including opioid prescriptions. Patient is not currently taking opioid prescriptions. Functional ability and status Nutritional status Physical activity Advanced directives List of other physicians Hospitalizations, surgeries, and ER visits in previous 12 months Vitals Screenings to include cognitive, depression, and falls Referrals and appointments  In addition, I have reviewed and discussed with patient certain preventive protocols, quality metrics, and best practice recommendations. A written personalized care plan for preventive services as well as general preventive health recommendations were provided to patient.   Lavelle Pfeiffer [place], [place]   [id number]   After Visit Summary:  (MyChart) Due to this being a telephonic visit, the after visit summary with patients personalized plan was offered to patient via MyChart   Notes: Nothing significant to report at this time.

## 2023-12-25 NOTE — Patient Instructions (Signed)
 Julia [name],  Thank you for taking the time for your Medicare Wellness Visit. I appreciate your continued commitment to your health goals. Please review the care plan we discussed, and feel free to reach out if I can assist you further.  Medicare recommends these wellness visits once per year to help you and your care team stay ahead of potential health issues. These visits are designed to focus on prevention, allowing your provider to concentrate on managing your acute and chronic conditions during your regular appointments.  Please note that Annual Wellness Visits do not include a physical exam. Some assessments may be limited, especially if the visit was conducted virtually. If needed, we may recommend a separate in-person follow-up with your provider.  Ongoing Care Seeing your primary care provider every 3 to 6 months helps us  monitor your health and provide consistent, personalized care.   Referrals If a referral was made during today's visit and you haven't received any updates within two weeks, please contact the referred provider directly to check on the status.  Recommended Screenings:  Health Maintenance  Topic Date Due   COVID-19 Vaccine (1) Never done   Hepatitis C Screening  Never done   DTaP/Tdap/Td vaccine (1 - Tdap) Never done   Pneumococcal Vaccine for age over 74 (1 of 2 - PCV) Never done   Zoster (Shingles) Vaccine (1 of 2) Never done   Colon Cancer Screening  03/19/2024   Pap with HPV screening  11/08/2024*   Breast Cancer Screening  11/11/2024*   Medicare Annual Wellness Visit  12/24/2024   Flu Shot  Completed   HIV Screening  Completed   Hepatitis B Vaccine  Aged Out   HPV Vaccine  Aged Out   Meningitis B Vaccine  Aged Out  *Topic was postponed. The date shown is not the original due date.       12/25/2023    1:06 PM  Advanced Directives  Does Patient Have a Medical Advance Directive? No  Would patient like information on creating a medical advance  directive? Yes (MAU/Ambulatory/Procedural Areas - Information given)   Advance Care Planning is important because it: Ensures you receive medical care that aligns with your values, goals, and preferences. Provides guidance to your family and loved ones, reducing the emotional burden of decision-making during critical moments.  Information on Advanced Care Planning can be found at [place]  Secretary of [facility] Advance Health Care Directives Advance Health Care Directives ([email or web address]   Vision: Annual vision screenings are recommended for early detection of glaucoma, cataracts, and diabetic retinopathy. These exams can also reveal signs of chronic conditions such as diabetes and high blood pressure.  Dental: Annual dental screenings help detect early signs of oral cancer, gum disease, and other conditions linked to overall health, including heart disease and diabetes.  Please see the attached documents for additional preventive care recommendations.

## 2024-01-05 ENCOUNTER — Telehealth: Payer: Self-pay | Admitting: Physician Assistant

## 2024-01-05 NOTE — Telephone Encounter (Signed)
 Copied from CRM #[id number]. Topic: Clinical - Medication Question >> Jan 05, 2024 10:09 AM Tonda B wrote: Reason for CRM: patient needs all her rx to be switched to her new provider courtney grooms name so that she can get them filled patient has already seen grooms please call pt back  [phone] (M

## 2024-01-06 ENCOUNTER — Other Ambulatory Visit: Payer: Self-pay | Admitting: Physician Assistant

## 2024-01-06 ENCOUNTER — Other Ambulatory Visit: Payer: Self-pay

## 2024-01-06 DIAGNOSIS — E782 Mixed hyperlipidemia: Secondary | ICD-10-CM

## 2024-01-06 DIAGNOSIS — E039 Hypothyroidism, unspecified: Secondary | ICD-10-CM

## 2024-01-06 DIAGNOSIS — F419 Anxiety disorder, unspecified: Secondary | ICD-10-CM

## 2024-01-06 DIAGNOSIS — I10 Essential (primary) hypertension: Secondary | ICD-10-CM

## 2024-01-06 MED ORDER — ROSUVASTATIN CALCIUM 10 MG PO TABS: 10.0000 mg | ORAL_TABLET | Freq: Every day | ORAL | 3 refills | Status: DC

## 2024-01-06 MED ORDER — AMLODIPINE BESYLATE 5 MG PO TABS: 10.0000 mg | ORAL_TABLET | Freq: Every day | ORAL | 1 refills | Status: AC

## 2024-01-06 MED ORDER — SERTRALINE HCL 25 MG PO TABS: 25.0000 mg | ORAL_TABLET | Freq: Every day | ORAL | 1 refills | Status: DC

## 2024-01-06 MED ORDER — LEVOTHYROXINE SODIUM 25 MCG PO TABS: 25.0000 ug | ORAL_TABLET | Freq: Every day | ORAL | 1 refills | Status: DC

## 2024-01-06 MED ORDER — QUETIAPINE FUMARATE 25 MG PO TABS: 25.0000 mg | ORAL_TABLET | Freq: Every day | ORAL | 1 refills | Status: DC | PRN

## 2024-01-06 MED ORDER — AMLODIPINE BESYLATE 5 MG PO TABS: 10.0000 mg | ORAL_TABLET | Freq: Every day | ORAL | 1 refills | Status: DC

## 2024-01-06 NOTE — Telephone Encounter (Signed)
 I spoke with patient she requested her meds be resent to [facility] pharmacy instead.

## 2024-01-10 DIAGNOSIS — J069 Acute upper respiratory infection, unspecified: Secondary | ICD-10-CM | POA: Diagnosis not present

## 2024-01-26 ENCOUNTER — Other Ambulatory Visit: Payer: Self-pay | Admitting: Physician Assistant

## 2024-02-01 ENCOUNTER — Other Ambulatory Visit: Payer: Self-pay | Admitting: Physician Assistant

## 2024-02-01 ENCOUNTER — Other Ambulatory Visit: Payer: Self-pay

## 2024-02-01 ENCOUNTER — Encounter: Payer: Self-pay | Admitting: Physician Assistant

## 2024-02-01 ENCOUNTER — Telehealth: Payer: Self-pay

## 2024-02-01 DIAGNOSIS — E782 Mixed hyperlipidemia: Secondary | ICD-10-CM

## 2024-02-01 DIAGNOSIS — E039 Hypothyroidism, unspecified: Secondary | ICD-10-CM

## 2024-02-01 DIAGNOSIS — I10 Essential (primary) hypertension: Secondary | ICD-10-CM

## 2024-02-01 DIAGNOSIS — E212 Other hyperparathyroidism: Secondary | ICD-10-CM

## 2024-02-01 MED ORDER — ALPRAZOLAM 1 MG PO TABS: 1.0000 mg | ORAL_TABLET | Freq: Two times a day (BID) | ORAL | 0 refills | Status: DC | PRN

## 2024-02-01 MED ORDER — ROSUVASTATIN CALCIUM 10 MG PO TABS
10.0000 mg | ORAL_TABLET | Freq: Every day | ORAL | 3 refills | Status: AC
Start: 2024-02-01 — End: ?

## 2024-02-01 MED ORDER — LEVOTHYROXINE SODIUM 25 MCG PO TABS: 25.0000 ug | ORAL_TABLET | Freq: Every day | ORAL | 1 refills | Status: AC

## 2024-02-01 NOTE — Telephone Encounter (Signed)
 Copied from CRM #[id number]. Topic: Clinical - Medication Refill >> Feb 01, 2024  9:37 AM Avram MATSU wrote: Medication: ALPRAZolam  (XANAX ) 1 MG tablet [[id number] rosuvastatin  (CRESTOR ) 10 MG tablet [[id number]]  Has the patient contacted their pharmacy? Yes (Agent: If no, request that the patient contact the pharmacy for the refill. If patient does not wish to contact the pharmacy document the reason why and proceed with request.) (Agent: If yes, when and what did the pharmacy advise?)  This is the patient's preferred pharmacy:  [facility] [place], [place] - [id number] [place] [address] Phone: [phone] Fax: [phone]  Is this the correct pharmacy for this prescription? Yes If no, delete pharmacy and type the correct one.   Has the prescription been filled recently? No  Is the patient out of the medication? Yes  Has the patient been seen for an appointment in the last year OR does the patient have an upcoming appointment? Yes  Can we respond through MyChart? No  Agent: Please be advised that Rx refills may take up to 3 business days. We ask that you follow-up with your pharmacy.

## 2024-02-01 NOTE — Telephone Encounter (Signed)
 Pt's daughter messaged and asked for refills on her mother's potassium , alprazolam  ,and her thyroid  medicine. It looks like she had an establish care appointment in September. If you are okay with it, I can send in the potassium and thyroid  medication for you. Thanks!

## 2024-02-02 ENCOUNTER — Other Ambulatory Visit: Payer: Self-pay | Admitting: *Deleted

## 2024-02-02 DIAGNOSIS — I10 Essential (primary) hypertension: Secondary | ICD-10-CM

## 2024-02-03 LAB — COMPREHENSIVE METABOLIC PANEL WITH GFR
ALT: 10 IU/L (ref 0–32)
AST: 15 IU/L (ref 0–40)
Albumin: 4.4 g/dL (ref 3.9–4.9)
Alkaline Phosphatase: 146 IU/L — ABNORMAL HIGH (ref 49–135)
BUN/Creatinine Ratio: 15 (ref 12–28)
BUN: 13 mg/dL (ref 8–27)
Bilirubin Total: 0.3 mg/dL (ref 0.0–1.2)
CO2: 25 mmol/L (ref 20–29)
Calcium: 10 mg/dL (ref 8.7–10.3)
Chloride: 104 mmol/L (ref 96–106)
Creatinine, Ser: 0.84 mg/dL (ref 0.57–1.00)
Globulin, Total: 1.8 g/dL (ref 1.5–4.5)
Glucose: 100 mg/dL — ABNORMAL HIGH (ref 70–99)
Potassium: 4.3 mmol/L (ref 3.5–5.2)
Sodium: 142 mmol/L (ref 134–144)
Total Protein: 6.2 g/dL (ref 6.0–8.5)
eGFR: 78 mL/min/1.73 (ref >59)

## 2024-02-08 ENCOUNTER — Ambulatory Visit: Payer: Self-pay | Admitting: Physician Assistant

## 2024-02-08 ENCOUNTER — Telehealth: Payer: Self-pay

## 2024-02-08 ENCOUNTER — Other Ambulatory Visit: Payer: Self-pay | Admitting: Physician Assistant

## 2024-02-08 MED ORDER — ALPRAZOLAM 1 MG PO TABS: 1.0000 mg | ORAL_TABLET | Freq: Four times a day (QID) | ORAL | 0 refills | Status: DC | PRN

## 2024-02-08 MED ORDER — POTASSIUM CHLORIDE CRYS ER 20 MEQ PO TBCR: 20.0000 meq | EXTENDED_RELEASE_TABLET | Freq: Two times a day (BID) | ORAL | 0 refills | Status: DC

## 2024-02-08 NOTE — Telephone Encounter (Signed)
 Pt daughter come by patient is almost out of  potassium chloride  SA (KLOR-CON  M) 20 MEQ tablet she said Charmaine was going to look at her lab work.Also patient said her ALPRAZolam  (XANAX ) 1 MG tablet  she was taken 4 a day and now a RX was sent in for 2 a day she has been taken the 4 a day when she got this RX what is she to do about the amount?

## 2024-02-11 ENCOUNTER — Encounter: Payer: Self-pay | Admitting: Physician Assistant

## 2024-02-18 ENCOUNTER — Telehealth: Payer: Self-pay

## 2024-02-18 NOTE — Telephone Encounter (Signed)
 Patient is needing blood work to retest potassium for next week took her last pill yesterday

## 2024-02-19 LAB — TSH: TSH: 2.04 u[IU]/mL (ref 0.450–4.500)

## 2024-02-19 LAB — ALKALINE PHOSPHATASE, BONE SPECIFIC: Tandem-R Ostase: 39.9 ug/L

## 2024-02-19 LAB — VITAMIN D 25 HYDROXY (VIT D DEFICIENCY, FRACTURES): Vit D, 25-Hydroxy: 47.8 ng/mL (ref 30.0–100.0)

## 2024-02-19 LAB — PTH, INTACT AND CALCIUM
Calcium: 10.1 mg/dL (ref 8.7–10.3)
PTH: 35 pg/mL (ref 15–65)

## 2024-02-19 LAB — T4, FREE: Free T4: 1.05 ng/dL (ref 0.82–1.77)

## 2024-02-19 LAB — MAGNESIUM: Magnesium: 1.9 mg/dL (ref 1.6–2.3)

## 2024-02-22 ENCOUNTER — Other Ambulatory Visit: Payer: Self-pay | Admitting: Physician Assistant

## 2024-02-22 DIAGNOSIS — E876 Hypokalemia: Secondary | ICD-10-CM

## 2024-02-22 NOTE — Telephone Encounter (Signed)
 Patient notified

## 2024-02-25 ENCOUNTER — Ambulatory Visit: Payer: Self-pay | Admitting: Physician Assistant

## 2024-02-25 LAB — BASIC METABOLIC PANEL WITH GFR
BUN/Creatinine Ratio: 15 (ref 12–28)
BUN: 13 mg/dL (ref 8–27)
CO2: 28 mmol/L (ref 20–29)
Calcium: 10.3 mg/dL (ref 8.7–10.3)
Chloride: 104 mmol/L (ref 96–106)
Creatinine, Ser: 0.86 mg/dL (ref 0.57–1.00)
Glucose: 71 mg/dL (ref 70–99)
Potassium: 3.3 mmol/L — ABNORMAL LOW (ref 3.5–5.2)
Sodium: 144 mmol/L (ref 134–144)
eGFR: 75 mL/min/1.73 (ref >59)

## 2024-02-25 MED ORDER — POTASSIUM CHLORIDE CRYS ER 20 MEQ PO TBCR: 20.0000 meq | EXTENDED_RELEASE_TABLET | Freq: Two times a day (BID) | ORAL | 1 refills | Status: DC

## 2024-02-26 ENCOUNTER — Ambulatory Visit: Admitting: "Endocrinology

## 2024-02-26 ENCOUNTER — Encounter: Payer: Self-pay | Admitting: "Endocrinology

## 2024-02-26 VITALS — BP 112/74 | HR 64 | Ht 60.0 in | Wt 90.2 lb

## 2024-02-26 DIAGNOSIS — E039 Hypothyroidism, unspecified: Secondary | ICD-10-CM | POA: Diagnosis not present

## 2024-02-26 DIAGNOSIS — E876 Hypokalemia: Secondary | ICD-10-CM | POA: Diagnosis not present

## 2024-02-26 MED ORDER — LEVOTHYROXINE SODIUM 25 MCG PO TABS: 25.0000 ug | ORAL_TABLET | Freq: Every day | ORAL | 1 refills | Status: AC

## 2024-02-26 MED ORDER — CINACALCET HCL 30 MG PO TABS: 30.0000 mg | ORAL_TABLET | Freq: Every day | ORAL | 1 refills | Status: AC

## 2024-02-26 MED ORDER — POTASSIUM CHLORIDE CRYS ER 20 MEQ PO TBCR: 20.0000 meq | EXTENDED_RELEASE_TABLET | Freq: Two times a day (BID) | ORAL | 3 refills | Status: AC

## 2024-02-26 NOTE — Progress Notes (Signed)
 "                                                      02/26/2024, 11:34 AM   Endocrinology follow-up note  Julia [name] is a 63 y.o.-year-old female, referred by her  Grooms, [place], [place]  , for evaluation for hypercalcemia/hyperparathyroidism.  She is returning to discuss her recent labs .  Past Medical History:  Diagnosis Date   Alzheimer disease (HCC)    Anxiety    Brain tumor (benign) (HCC)    Chronic pain    Dementia (HCC)    Depression    Elevated cholesterol    Headache(784.0)    Hypertension    Numerous moles    Osteoporosis    Seizures (HCC)    Trichimoniasis     Past Surgical History:  Procedure Laterality Date   EXCISION OF SKIN TAG N/A 09/22/2017   Procedure: EXCISION OF SKIN TAGS ON FACE AND NECK (Procedure #2);  Surgeon: Edsel Norleen GAILS, MD;  Location: [facility];  Service: Gynecology;  Laterality: N/A;   HYSTEROSCOPY WITH D & C N/A 09/22/2017   Procedure: DILATATION AND CURETTAGE /HYSTEROSCOPY; EXCISION OF VULVAR AND RIGHT AND LEFT THIGH SKIN TAGS (Procedure #1);  Surgeon: Edsel Norleen GAILS, MD;  Location: [facility];  Service: Gynecology;  Laterality: N/A;   POLYPECTOMY N/A 09/22/2017   Procedure: REMOVAL OF ENDOMETRIAL POLYP (Procedure #1);  Surgeon: Edsel Norleen GAILS, MD;  Location: [facility];  Service: Gynecology;  Laterality: N/A;   TUBAL LIGATION      Social History   Tobacco Use   Smoking status: Never   Smokeless tobacco: Never  Vaping Use   Vaping status: Never Used  Substance Use Topics   Alcohol use: No   Drug use: No    Family History  Problem Relation Age of Onset   Cancer Father        lung   Stroke Mother    Heart failure Mother    Cancer Brother    Diabetes Brother     Outpatient Encounter Medications as of 02/26/2024  Medication Sig   ALPRAZolam  (XANAX ) 1 MG tablet Take 1 tablet (1 mg total) by mouth 4 (four) times daily as needed for anxiety.   amLODipine  (NORVASC ) 5 MG tablet Take 2 tablets (10 mg total) by mouth daily.    Cholecalciferol (VITAMIN D3) 50 MCG (2000 UT) capsule Take 1 capsule (2,000 Units total) by mouth daily.   QUEtiapine  (SEROQUEL ) 25 MG tablet Take 1-2 tablets (25-50 mg total) by mouth daily as needed.   rosuvastatin  (CRESTOR ) 10 MG tablet Take 1 tablet (10 mg total) by mouth daily.   sertraline  (ZOLOFT ) 25 MG tablet Take 1 tablet (25 mg total) by mouth daily.   [DISCONTINUED] cinacalcet  (SENSIPAR ) 30 MG tablet Take 1 tablet (30 mg total) by mouth daily with breakfast.   [DISCONTINUED] levothyroxine  (SYNTHROID ) 25 MCG tablet Take 1 tablet (25 mcg total) by mouth daily before breakfast.   cinacalcet  (SENSIPAR ) 30 MG tablet Take 1 tablet (30 mg total) by mouth daily with breakfast.   levothyroxine  (SYNTHROID ) 25 MCG tablet Take 1 tablet (25 mcg total) by mouth daily before breakfast.   potassium chloride  SA (KLOR-CON  M) 20 MEQ tablet Take 1 tablet (20 mEq total) by mouth 2 (two) times daily.   [DISCONTINUED] potassium chloride  SA (KLOR-CON   M) 20 MEQ tablet Take 1 tablet (20 mEq total) by mouth 2 (two) times daily. (Patient not taking: Reported on 02/26/2024)   No facility-administered encounter medications on file as of 02/26/2024.    Allergies  Allergen Reactions   Propranolol     Naprosyn [Naproxen] Nausea Only   Penicillins Nausea And Vomiting and Other (See Comments)    Has patient had a PCN reaction causing immediate rash, facial/tongue/throat swelling, SOB or lightheadedness with hypotension: No Has patient had a PCN reaction causing severe rash involving mucus membranes or skin necrosis: No Has patient had a PCN reaction that required hospitalization Yes Has patient had a PCN reaction occurring within the last 10 years: No If all of the above answers are NO, then may proceed with Cephalosporin use.      HPI  TONNIE [name] was diagnosed with hypercalcemia since 2022.  After appropriate workup indicating possibility of primary hyperparathyroidism, coupled with the fact that she  did not want to, and being an unsuitable candidate for surgery,  she was started on low-dose Sensipar .  She is currently on Sensipar  30 mg p.o. every day with optimal response in her calcium  levels.   She continues to respond to this intervention with calcium  in target range. She is also on low-dose levothyroxine  for hypothyroidism.  Patient is accompanied by her daughter. Per family, she is still on hospice care due to cognitive decline and failure to thrive.  Her hypercalcemia was complicated by osteoporosis for which she was also started on alendronate  70 mg weekly which she is no longer taking.  She wishes to continue treatment for hypercalcemia as well as hypothyroidism.  Her previsit labs are favorable, except that she has hypokalemia.  She is not on potassium supplement at this time.  No prior history of fragility fractures or falls. No history of  kidney stones. She complains of abdominal pain, GERD. No history of CKD.  she is not on HCTZ or other thiazide therapy.  No history of  vitamin D deficiency.   she is not on calcium  supplements.  Her consumption of green leafy vegetables is suboptimal.  she does not have a family history of hypercalcemia, pituitary tumors, thyroid  cancer, or osteoporosis.   I reviewed her chart and she also has a history of hyperlipidemia, hypertension on treatment..    ROS:  Constitutional: no weight gain/loss, no fatigue, no subjective hyperthermia, no subjective hypothermia   PE: BP 112/74 (BP Location: Left Arm, Patient Position: Sitting, Cuff Size: Large)   Pulse 64   Ht 5' (1.524 m)   Wt 90 lb 3.2 oz (40.9 kg)   LMP 09/06/2012   BMI 17.62 kg/m , Body mass index is 17.62 kg/m. Wt Readings from Last 3 Encounters:  02/26/24 90 lb 3.2 oz (40.9 kg)  12/25/23 86 lb (39 kg)  11/12/23 86 lb 6.4 oz (39.2 kg)    Constitutional: + BMI 24.7 , + dysphoric and temperament, not in acute distress, normal state of mind Eyes: PERRLA, EOMI, no  exophthalmos ENT: moist mucous membranes, no gross thyromegaly, no gross cervical lymphadenopathy    Recent Results (from the past 2160 hours)  Comprehensive Metabolic Panel (CMET)     Status: Abnormal   Collection Time: 02/02/24  3:48 PM  Result Value Ref Range   Glucose 100 (H) 70 - 99 mg/dL   BUN 13 8 - 27 mg/dL   Creatinine, Ser 9.15 0.57 - 1.00 mg/dL   eGFR 78 >40 fO/fpw/8.26   BUN/Creatinine Ratio 15 12 -  28   Sodium 142 134 - 144 mmol/L   Potassium 4.3 3.5 - 5.2 mmol/L   Chloride 104 96 - 106 mmol/L   CO2 25 20 - 29 mmol/L   Calcium  10.0 8.7 - 10.3 mg/dL   Total Protein 6.2 6.0 - 8.5 g/dL   Albumin 4.4 3.9 - 4.9 g/dL   Globulin, Total 1.8 1.5 - 4.5 g/dL   Bilirubin Total 0.3 0.0 - 1.2 mg/dL   Alkaline Phosphatase 146 (H) 49 - 135 IU/L   AST 15 0 - 40 IU/L   ALT 10 0 - 32 IU/L  TSH     Status: None   Collection Time: 02/17/24  8:33 AM  Result Value Ref Range   TSH 2.040 0.450 - 4.500 uIU/mL  T4, free     Status: None   Collection Time: 02/17/24  8:33 AM  Result Value Ref Range   Free T4 1.05 0.82 - 1.77 ng/dL  PTH, intact and calcium      Status: None   Collection Time: 02/17/24  8:33 AM  Result Value Ref Range   Calcium  10.1 8.7 - 10.3 mg/dL   PTH 35 15 - 65 pg/mL   PTH Interp Comment     Comment: Interpretation                 Intact PTH    Calcium                                  (pg/mL)      (mg/dL) Normal                          15 - 65     8.6 - 10.2 Primary Hyperparathyroidism         >65          >10.2 Secondary Hyperparathyroidism       >65          <10.2 Non-Parathyroid Hypercalcemia       <65          >10.2 Hypoparathyroidism                  <15          < 8.6 Non-Parathyroid Hypocalcemia    15 - 65          < 8.6   Magnesium      Status: None   Collection Time: 02/17/24  8:33 AM  Result Value Ref Range   Magnesium  1.9 1.6 - 2.3 mg/dL  VITAMIN D 25 Hydroxy (Vit-D Deficiency, Fractures)     Status: None   Collection Time: 02/17/24  8:33 AM  Result  Value Ref Range   Vit D, 25-Hydroxy 47.8 30.0 - 100.0 ng/mL    Comment: Vitamin D deficiency has been defined by the Institute of Medicine and an Endocrine Society practice guideline as a level of serum 25-OH vitamin D less than 20 ng/mL (1,2). The Endocrine Society went on to further define vitamin D insufficiency as a level between 21 and 29 ng/mL (2). 1. IOM (Institute of Medicine). 2010. Dietary reference    intakes for calcium  and D. [place]: The    [employer]. 2. Holick MF, Binkley Lyons, Bischoff-Ferrari HA, et al.    Evaluation, treatment, and prevention of vitamin D    deficiency: an Endocrine Society clinical practice    guideline. JCEM. 2011 Jul;  96(7):1911-30.   Alkaline phosphatase, bone specific     Status: None   Collection Time: 02/17/24  8:33 AM  Result Value Ref Range   Tandem-R Ostase 39.9 ug/L    Comment:          Premenopausal Women:               6.0 - 22.7          Postmenopausal Women:              8.1 - 31.6   Basic Metabolic Panel     Status: Abnormal   Collection Time: 02/24/24  9:46 AM  Result Value Ref Range   Glucose 71 70 - 99 mg/dL   BUN 13 8 - 27 mg/dL   Creatinine, Ser 9.13 0.57 - 1.00 mg/dL   eGFR 75 >40 fO/fpw/8.26   BUN/Creatinine Ratio 15 12 - 28   Sodium 144 134 - 144 mmol/L   Potassium 3.3 (L) 3.5 - 5.2 mmol/L   Chloride 104 96 - 106 mmol/L   CO2 28 20 - 29 mmol/L   Calcium  10.3 8.7 - 10.3 mg/dL      Assessment: 1. Hypercalcemia / Hyperparathyroidism 2.  Hypothyroidism 3.  Vitamin D deficiency 4.  Hypokalemia  Plan: -She presents with controlled calcium  on Sensipar  treatment.  She is advised to continue Sensipar  30 mg p.o. every day with breakfast.  She presents with thyroid  function tests consistent with appropriate replacement.  She is advised to continue levothyroxine  25 mcg p.o. daily before breakfast.    - We discussed about the correct intake of her thyroid  hormone, on empty stomach at fasting, with water ,  separated by at least 30 minutes from breakfast and other medications,  and separated by more than 4 hours from calcium , iron, multivitamins, acid reflux medications (PPIs). -Patient is made aware of the fact that thyroid  hormone replacement is needed for life, dose to be adjusted by periodic monitoring of thyroid  function tests.  She was also taken off of her alendronate  she was taking for osteoporosis. I discussed and reinitiated her potassium 20 mill equivalents p.o. twice daily for hypokalemia  with K at 3.3. She does have unexplained elevation of alk phos, she is advised to start vitamin D3 2000 units daily.  She is advised to maintain close follow-up with her PMD.   I spent  20  minutes in the care of the patient today including review of labs from Thyroid  Function, CMP, and other relevant labs ; imaging/biopsy records (current and previous including abstractions from other facilities); face-to-face time discussing  her lab results and symptoms, medications doses, her options of short and long term treatment based on the latest standards of care / guidelines;   and documenting the encounter.  [name]  participated in the discussions, expressed understanding, and voiced agreement with the above plans.  All questions were answered to her satisfaction. she is encouraged to contact clinic should she have any questions or concerns prior to her return visit.  She will return with repeat labs in 4 months.   Ranny Earl, MD [facility] [address] [place], [place] [address] Phone: [phone]  Fax: [phone]    This note was partially dictated with voice recognition software. Similar sounding words can be transcribed inadequately or may not  be corrected upon review.  02/26/2024, 11:34 AM "

## 2024-03-15 ENCOUNTER — Other Ambulatory Visit: Payer: Self-pay

## 2024-03-15 ENCOUNTER — Encounter: Payer: Self-pay | Admitting: Physician Assistant

## 2024-03-15 MED ORDER — ALPRAZOLAM 1 MG PO TABS: 1.0000 mg | ORAL_TABLET | Freq: Four times a day (QID) | ORAL | 0 refills | Status: DC | PRN

## 2024-03-23 ENCOUNTER — Ambulatory Visit: Admitting: Physician Assistant

## 2024-03-23 ENCOUNTER — Encounter: Payer: Self-pay | Admitting: Physician Assistant

## 2024-03-23 VITALS — BP 128/78 | HR 70 | Temp 98.8°F | Ht 60.0 in | Wt 88.4 lb

## 2024-03-23 DIAGNOSIS — Q8500 Neurofibromatosis, unspecified: Secondary | ICD-10-CM | POA: Diagnosis not present

## 2024-03-23 DIAGNOSIS — F419 Anxiety disorder, unspecified: Secondary | ICD-10-CM | POA: Diagnosis not present

## 2024-03-23 DIAGNOSIS — F03B18 Unspecified dementia, moderate, with other behavioral disturbance: Secondary | ICD-10-CM

## 2024-03-23 MED ORDER — QUETIAPINE FUMARATE 25 MG PO TABS: 50.0000 mg | ORAL_TABLET | Freq: Every day | ORAL | 1 refills | Status: AC

## 2024-03-23 MED ORDER — ALPRAZOLAM 1 MG PO TABS: 1.0000 mg | ORAL_TABLET | Freq: Three times a day (TID) | ORAL | 0 refills | Status: AC | PRN

## 2024-03-23 MED ORDER — SERTRALINE HCL 50 MG PO TABS: 50.0000 mg | ORAL_TABLET | Freq: Every day | ORAL | 3 refills | Status: AC

## 2024-03-23 NOTE — Progress Notes (Signed)
 "  Established Patient Office Visit  Subjective   Patient ID: JASIYAH POLAND, female    DOB: 10/17/1960  Age: 64 y.o. MRN: [id number]  Chief Complaint  Patient presents with   Medication Problem    PT was only prescribed 45 pills but supposed to take up to 4 a day for anxiety  Pt daughter states she is constantly shaking and nervous  Thyroid  dr put her back on potassium     Discussed the use of AI scribe software for clinical note transcription with the patient, who gave verbal consent to proceed.  History of Present Illness DONN WILMOT is a 64 year old female who presents with concerns regarding her Xanax  prescription and medication management.  She is confused about her Xanax  dosage and quantity. Last month she received 90 pills with instructions to take 1 pill four times a day as needed for anxiety. This month she received 45 pills with the same instructions, which she feels is not enough to control her anxiety.  She is currently taking Xanax  1 mg four times a day, along with Zoloft  and Seroquel . Her potassium supplement was stopped in the past and then restarted by her thyroid  doctor, and she is unsure of the reason. Several medications were stopped while she was on hospice and then restarted afterward.  She has a new painless mole-like spot on her nose. Another mole recently fell off in the shower without bleeding. No associated pain or pruritus. Patient with history of neurofibromatosis, and is already established with dermatology.      Review of Systems  Constitutional:  Positive for activity change. Negative for appetite change and fatigue.  Respiratory:  Negative for chest tightness.   Cardiovascular:  Negative for chest pain and palpitations.  Neurological:  Positive for tremors (due to anxiety). Negative for headaches.  Psychiatric/Behavioral:  Negative for agitation, dysphoric mood and hallucinations. The patient is nervous/anxious.   All other systems reviewed and  are negative.      Objective:     BP 128/78 (BP Location: Right Arm, Cuff Size: Large)   Pulse 70   Temp 98.8 F (37.1 C)   Ht 5' (1.524 m)   Wt 88 lb 6.4 oz (40.1 kg)   LMP 09/06/2012   SpO2 97%   BMI 17.26 kg/m    Physical Exam Constitutional:      General: She is not in acute distress.    Appearance: Normal appearance. She is normal weight. She is ill-appearing (chronically ill appearing, appears older than actual age).  HENT:     Head: Normocephalic and atraumatic.     Mouth/Throat:     Mouth: Mucous membranes are moist.     Pharynx: Oropharynx is clear.  Eyes:     Extraocular Movements: Extraocular movements intact.     Conjunctiva/sclera: Conjunctivae normal.  Cardiovascular:     Rate and Rhythm: Normal rate and regular rhythm.     Heart sounds: Normal heart sounds. No murmur heard. Pulmonary:     Effort: Pulmonary effort is normal.     Breath sounds: Normal breath sounds. No wheezing, rhonchi or rales.  Skin:    General: Skin is warm and dry.  Neurological:     General: No focal deficit present.     Mental Status: She is alert and oriented to person, place, and time.  Psychiatric:        Mood and Affect: Mood is anxious.        Behavior: Behavior normal.  No results found for any visits on 03/23/24.  The 10-year ASCVD risk score (Arnett DK, et al., 2019) is: 11.2%    Assessment & Plan:   Return in about 6 weeks (around 05/04/2024) for anxiety .   Moderate dementia with other behavioral disturbance, unspecified dementia type (HCC) -     ALPRAZolam ; Take 1 tablet (1 mg total) by mouth 3 (three) times daily as needed for anxiety.  Dispense: 90 tablet; Refill: 0 -     QUEtiapine  Fumarate; Take 2 tablets (50 mg total) by mouth at bedtime.  Dispense: 60 tablet; Refill: 1  Anxiety Assessment & Plan: Current Alprazolam  regimen not preferred due to side effects. Prescription discrepancy noted. Plan to reduce Alprazolam  while maintaining anxiety  control. - Increased Sertraline  and Quetiapine  for baseline anxiety maintenance. - Reduced Alprazolam  to 1 mg three times daily for one month. Ideally will taper down to 1 mg bid.  - Reassess anxiety control and Alprazolam  dosage in six weeks. - Consider psychiatry referral if anxiety control is inadequate.  Orders: -     ALPRAZolam ; Take 1 tablet (1 mg total) by mouth 3 (three) times daily as needed for anxiety.  Dispense: 90 tablet; Refill: 0 -     Sertraline  HCl; Take 1 tablet (50 mg total) by mouth daily.  Dispense: 30 tablet; Refill: 3 -     QUEtiapine  Fumarate; Take 2 tablets (50 mg total) by mouth at bedtime.  Dispense: 60 tablet; Refill: 1  Neurofibromatosis syndrome [facility]) Assessment & Plan: New area of concern on the left side of her nose. Non tender, not erythematous, no drainage. Advised to monitor for changes and follow up with dermatologist for new concerns.      Hayat Warbington, PA-C "

## 2024-03-23 NOTE — Assessment & Plan Note (Signed)
 New area of concern on the left side of her nose. Non tender, not erythematous, no drainage. Advised to monitor for changes and follow up with dermatologist for new concerns.

## 2024-03-23 NOTE — Assessment & Plan Note (Signed)
 Current Alprazolam  regimen not preferred due to side effects. Prescription discrepancy noted. Plan to reduce Alprazolam  while maintaining anxiety control. - Increased Sertraline  and Quetiapine  for baseline anxiety maintenance. - Reduced Alprazolam  to 1 mg three times daily for one month. Ideally will taper down to 1 mg bid.  - Reassess anxiety control and Alprazolam  dosage in six weeks. - Consider psychiatry referral if anxiety control is inadequate.

## 2024-04-08 ENCOUNTER — Ambulatory Visit (INDEPENDENT_AMBULATORY_CARE_PROVIDER_SITE_OTHER): Admitting: Physician Assistant

## 2024-04-08 ENCOUNTER — Ambulatory Visit: Payer: Self-pay | Admitting: Physician Assistant

## 2024-04-08 ENCOUNTER — Encounter: Payer: Self-pay | Admitting: Physician Assistant

## 2024-04-08 ENCOUNTER — Ambulatory Visit (HOSPITAL_COMMUNITY)
Admission: RE | Admit: 2024-04-08 | Discharge: 2024-04-08 | Disposition: A | Source: Ambulatory Visit | Attending: Physician Assistant

## 2024-04-08 VITALS — BP 139/86 | HR 72 | Temp 98.8°F | Ht 60.0 in | Wt 87.8 lb

## 2024-04-08 DIAGNOSIS — R159 Full incontinence of feces: Secondary | ICD-10-CM | POA: Insufficient documentation

## 2024-04-08 NOTE — Assessment & Plan Note (Signed)
 Increased frequency of bowel movements over the past few weeks, occurring 6-7 times daily, with soft consistency. No associated abdominal pain, nausea, vomiting, or fever. No blood in stool. Previous treatment with medication for similar symptoms. Differential includes overflow diarrhea due to constipation. No recent changes in diet or fluid intake. Has a GI doctor in [place] at [facility]. - Ordered abdominal x-ray to assess for constipation or obstruction. - Apply diaper rash/barrier cream with each episode of encopresis.  - Contact GI provider for recommendations and potential appointment. - Advised to seek urgent care if blood in stool, fever, abdominal pain, nausea, or vomiting occur.

## 2024-04-08 NOTE — Progress Notes (Signed)
 "  Established Patient Office Visit  Subjective   Patient ID: Julia [name], female    DOB: 12/07/1960  Age: 64 y.o. MRN: [id number]  Chief Complaint  Patient presents with   Diarrhea    Pt will have bowel movements an not even know she went on herself Happened few years back - started again 2 wks ago  Was 2 x daily now 6-7 x Bottom is raw and painful    Discussed the use of AI scribe software for clinical note transcription with the patient, who gave verbal consent to proceed.  History of Present Illness Julia [name] is a 64 year old female with dementia who presents with increased bowel incontinence. She is accompanied by her daughter.  She has been experiencing increased bowel incontinence for the past couple of weeks, with bowel movements occurring six to seven times a day, a significant increase from her previous frequency of one to two times a day. The stools are consistently soft, and she is often unaware of the bowel movements until after they occur. There have been no normal, firm bowel movements in the last couple of weeks. No abdominal pain, nausea, vomiting, fevers, or blood in the stool.  Two years ago, she experienced a similar issue and was placed on medication by hospice care, which helped reduce the frequency of bowel movements, although it did not completely resolve the problem. The name of the medication is not recalled by her daughter.  Her appetite and fluid intake remain normal, and there have been no changes in her eating or drinking habits. She continues to take her medications as prescribed.  Regarding her dementia, her daughter reports that her memory has not significantly changed recently. She still recognizes everyone but tends to move her legs and hands constantly throughout the day. She has not seen a neurologist since her initial diagnosis. She does have a GI provider she is established with.     Review of Systems  Constitutional:  Negative for  appetite change and fever.  Gastrointestinal:  Positive for diarrhea. Negative for abdominal distention, abdominal pain, blood in stool, nausea and vomiting.  Genitourinary:  Negative for dysuria and enuresis.  Psychiatric/Behavioral:  Negative for agitation.        Objective:     BP 139/86   Pulse 72   Temp 98.8 F (37.1 C)   Ht 5' (1.524 m)   Wt 87 lb 12.8 oz (39.8 kg)   LMP 09/06/2012   SpO2 100%   BMI 17.15 kg/m    Physical Exam Constitutional:      General: She is not in acute distress.    Appearance: Normal appearance. She is normal weight. She is not ill-appearing.  HENT:     Head: Normocephalic and atraumatic.     Mouth/Throat:     Mouth: Mucous membranes are moist.     Pharynx: Oropharynx is clear.  Eyes:     Extraocular Movements: Extraocular movements intact.     Conjunctiva/sclera: Conjunctivae normal.  Cardiovascular:     Rate and Rhythm: Normal rate and regular rhythm.     Heart sounds: Normal heart sounds. No murmur heard. Pulmonary:     Effort: Pulmonary effort is normal.     Breath sounds: Normal breath sounds.  Abdominal:     General: Abdomen is flat. Bowel sounds are normal. There is no distension.     Palpations: Abdomen is soft.     Tenderness: There is no abdominal tenderness.  Skin:  General: Skin is warm and dry.  Neurological:     General: No focal deficit present.     Mental Status: She is alert. Mental status is at baseline.  Psychiatric:        Mood and Affect: Mood normal.        Behavior: Behavior normal.     No results found for any visits on 04/08/24.  The 10-year ASCVD risk score (Arnett DK, et al., 2019) is: 13.1%    Assessment & Plan:   Return if symptoms worsen or fail to improve.   Encopresis Assessment & Plan: Increased frequency of bowel movements over the past few weeks, occurring 6-7 times daily, with soft consistency. No associated abdominal pain, nausea, vomiting, or fever. No blood in stool. Previous  treatment with medication for similar symptoms. Differential includes overflow diarrhea due to constipation. No recent changes in diet or fluid intake. Has a GI doctor in [place] at [facility]. - Ordered abdominal x-ray to assess for constipation or obstruction. - Apply diaper rash/barrier cream with each episode of encopresis.  - Contact GI provider for recommendations and potential appointment. - Advised to seek urgent care if blood in stool, fever, abdominal pain, nausea, or vomiting occur.  Orders: -     DG Abd 1 View   Parker Lovelle Lema, PA-C "

## 2024-05-04 ENCOUNTER — Ambulatory Visit: Admitting: Physician Assistant

## 2024-05-11 ENCOUNTER — Ambulatory Visit: Admitting: Physician Assistant

## 2024-08-29 ENCOUNTER — Ambulatory Visit: Admitting: "Endocrinology

## 2024-12-30 ENCOUNTER — Ambulatory Visit
